# Patient Record
Sex: Female | Born: 1980 | Hispanic: Yes | Smoking: Former smoker
Health system: Southern US, Community
[De-identification: ages and names within clinical notes are randomized; demographics above are authoritative.]

## PROBLEM LIST (undated history)

## (undated) ENCOUNTER — Inpatient Hospital Stay (HOSPITAL_COMMUNITY): Payer: Self-pay

## (undated) DIAGNOSIS — F32A Depression, unspecified: Secondary | ICD-10-CM

## (undated) DIAGNOSIS — T8859XA Other complications of anesthesia, initial encounter: Secondary | ICD-10-CM

## (undated) DIAGNOSIS — D649 Anemia, unspecified: Secondary | ICD-10-CM

## (undated) DIAGNOSIS — I10 Essential (primary) hypertension: Secondary | ICD-10-CM

## (undated) DIAGNOSIS — Z5189 Encounter for other specified aftercare: Secondary | ICD-10-CM

## (undated) DIAGNOSIS — E785 Hyperlipidemia, unspecified: Secondary | ICD-10-CM

## (undated) DIAGNOSIS — R112 Nausea with vomiting, unspecified: Secondary | ICD-10-CM

## (undated) HISTORY — DX: Encounter for other specified aftercare: Z51.89

## (undated) HISTORY — PX: ABDOMINAL HYSTERECTOMY: SHX81

## (undated) HISTORY — DX: Depression, unspecified: F32.A

## (undated) HISTORY — DX: Anemia, unspecified: D64.9

## (undated) HISTORY — PX: OTHER SURGICAL HISTORY: SHX169

---

## 2006-04-09 ENCOUNTER — Emergency Department (HOSPITAL_COMMUNITY): Admission: EM | Admit: 2006-04-09 | Discharge: 2006-04-10 | Payer: Self-pay | Admitting: Emergency Medicine

## 2006-05-04 ENCOUNTER — Inpatient Hospital Stay (HOSPITAL_COMMUNITY): Admission: AD | Admit: 2006-05-04 | Discharge: 2006-05-04 | Payer: Self-pay | Admitting: Obstetrics & Gynecology

## 2006-05-13 ENCOUNTER — Ambulatory Visit: Payer: Self-pay | Admitting: Obstetrics and Gynecology

## 2006-05-13 ENCOUNTER — Ambulatory Visit (HOSPITAL_COMMUNITY): Admission: RE | Admit: 2006-05-13 | Discharge: 2006-05-13 | Payer: Self-pay | Admitting: Family Medicine

## 2006-05-18 ENCOUNTER — Ambulatory Visit (HOSPITAL_COMMUNITY): Admission: RE | Admit: 2006-05-18 | Discharge: 2006-05-18 | Payer: Self-pay | Admitting: Obstetrics and Gynecology

## 2006-05-18 ENCOUNTER — Ambulatory Visit: Payer: Self-pay | Admitting: Obstetrics and Gynecology

## 2006-05-18 ENCOUNTER — Encounter (INDEPENDENT_AMBULATORY_CARE_PROVIDER_SITE_OTHER): Payer: Self-pay | Admitting: *Deleted

## 2007-02-12 ENCOUNTER — Ambulatory Visit (HOSPITAL_COMMUNITY): Admission: RE | Admit: 2007-02-12 | Discharge: 2007-02-12 | Payer: Self-pay | Admitting: Family Medicine

## 2010-08-08 NOTE — Op Note (Signed)
NAME:  Brooke Lam, Brooke Lam             ACCOUNT NO.:  [id number]   MEDICAL RECORD NO.:  [id number]          PATIENT TYPE:  AMB   LOCATION:  SDC                           [facility]ILITY:  [facility]   PHYSICIAN:  Phil D. Okey Dupre, M.D.     DATE OF BIRTH:  [birth year]   DATE OF PROCEDURE:  05/18/2006  DATE OF DISCHARGE:                               OPERATIVE REPORT   PROCEDURE:  Dilatation and evacuation.   PREOPERATIVE DIAGNOSIS:  Incomplete abortion.   POSTOPERATIVE DIAGNOSIS:  Incomplete abortion.   SURGEON:  Javier Glazier. Okey Dupre, M.D.   ANESTHESIA:  MAC plus local.   SPECIMENS TO PATHOLOGY:  Products of conception.   POSTOPERATIVE CONDITION:  Satisfactory.   ESTIMATED BLOOD LOSS:  10 mL   DESCRIPTION OF PROCEDURE:  Under satisfactory MAC sedation with the  patient in dorsal lithotomy position, perineum was prepped and draped in  the usual sterile manner.  Bimanual pelvic examination revealed the  uterus upper limits of normal, freely movable with normal free adnexa.  Weighted speculum was placed in the posterior fourchette of the vagina  through a marital introitus, BUS within normal limits.  Vagina is clean  and well rugated.  The cervix was single was clean and parous and  grasped with a single-tooth tenaculum.  The uterine cavity was sounded  to a depth of 10 cm.  Cervical os easily dilated to a #8 Hagar dilator.  A  #8 suction curette was used to evacuate the uterine contents without  incident.  The tenaculum was removed from the cervix and the speculum  removed from the vagina prior to the suction procedure being carried  out.  We injected 10 mL of 1% Xylocaine into each of the paracervical  areas at 8 and 4 o'clock for further patient comfort.  The patient  tolerated the procedure well and was transferred to the recovery room in  satisfactory condition.           ______________________________  Javier Glazier Okey Dupre, M.D.     PDR/MEDQ  D:  05/18/2006  T:  05/18/2006  Job:  [id number]

## 2010-08-08 NOTE — Group Therapy Note (Signed)
NAME:  Brooke Lam, Brooke Lam NO.:  [id number]   MEDICAL RECORD NO.:  [id number]          PATIENT TYPE:  WOC   LOCATION:  [facility]                   FACILITY:  [facility]   PHYSICIAN:  Argentina Donovan, MD        DATE OF BIRTH:  29-Oct-1980   DATE OF SERVICE:                                  CLINIC NOTE   The patient is a 30 year old English speaking Hispanic female, gravida  4, para 3, 0, 1, 3 with last menstrual period March 11, 2006. She was  seen in the MAU by Dr. Tamela Oddi and is an incomplete missed AB in  need of D&C and will be scheduled ASAP.   HISTORY:  Cesarean section x3. Normal Pap in [place] recently.   PHYSICAL EXAMINATION:  VITAL SIGNS: Temperature 98.9, pulse 77,  respirations 20, blood pressure is 109/65.  GENERAL APPEARANCE: Well-developed, well-nourished Hispanic female in no  acute distress.  HEENT: Within normal limits.  LUNGS: Clear to auscultation and percussion.  CARDIAC:  ABDOMEN: Soft, nontender.  PELVIC EXAM: External genitalia normal. Cervix is closed. Adnexa  nontender. Vagina with small amount of yellow discharge. Uterus about 6  weeks size.   She had a falling beta HCT, the last one done was 902. There was some  debris in the uterus on ultrasound; missed AB or incomplete.           ______________________________  Argentina Donovan, MD     PR/MEDQ  D:  05/13/2006  T:  05/13/2006  Job:  [id number]

## 2011-03-24 HISTORY — PX: BREAST BIOPSY: SHX20

## 2011-06-03 ENCOUNTER — Emergency Department (HOSPITAL_COMMUNITY)
Admission: EM | Admit: 2011-06-03 | Discharge: 2011-06-03 | Disposition: A | Discharge status: Home / Self Care | Payer: Self-pay | Attending: Emergency Medicine | Admitting: Emergency Medicine

## 2011-06-03 ENCOUNTER — Encounter (HOSPITAL_COMMUNITY): Payer: Self-pay | Admitting: Emergency Medicine

## 2011-06-03 ENCOUNTER — Emergency Department (HOSPITAL_COMMUNITY): Payer: Self-pay

## 2011-06-03 DIAGNOSIS — O99891 Other specified diseases and conditions complicating pregnancy: Secondary | ICD-10-CM | POA: Insufficient documentation

## 2011-06-03 DIAGNOSIS — Z3201 Encounter for pregnancy test, result positive: Secondary | ICD-10-CM

## 2011-06-03 DIAGNOSIS — R509 Fever, unspecified: Secondary | ICD-10-CM | POA: Insufficient documentation

## 2011-06-03 DIAGNOSIS — Z8759 Personal history of other complications of pregnancy, childbirth and the puerperium: Secondary | ICD-10-CM

## 2011-06-03 DIAGNOSIS — R1032 Left lower quadrant pain: Secondary | ICD-10-CM | POA: Insufficient documentation

## 2011-06-03 DIAGNOSIS — O210 Mild hyperemesis gravidarum: Secondary | ICD-10-CM | POA: Insufficient documentation

## 2011-06-03 LAB — BASIC METABOLIC PANEL
Calcium: 9.5 mg/dL (ref 8.4–10.5)
Chloride: 101 mEq/L (ref 96–112)
Creatinine, Ser: 0.67 mg/dL (ref 0.50–1.10)
GFR calc Af Amer: >90 mL/min (ref >90)
Potassium: 3.7 mEq/L (ref 3.5–5.1)
Sodium: 134 mEq/L — ABNORMAL LOW (ref 135–145)

## 2011-06-03 LAB — DIFFERENTIAL
Basophils Absolute: 0 10*3/uL (ref 0.0–0.1)
Eosinophils Absolute: 0 10*3/uL (ref 0.0–0.7)
Monocytes Absolute: 0.3 10*3/uL (ref 0.1–1.0)

## 2011-06-03 LAB — URINALYSIS, ROUTINE W REFLEX MICROSCOPIC
Leukocytes, UA: NEGATIVE
Nitrite: NEGATIVE

## 2011-06-03 LAB — WET PREP, GENITAL
Clue Cells Wet Prep HPF POC: NONE SEEN
Trich, Wet Prep: NONE SEEN
WBC, Wet Prep HPF POC: NONE SEEN

## 2011-06-03 LAB — CBC
HCT: 36 % (ref 36.0–46.0)
MCV: 82 fL (ref 78.0–100.0)

## 2011-06-03 LAB — HCG, QUANTITATIVE, PREGNANCY: hCG, Beta Chain, Quant, S: 140 m[IU]/mL — ABNORMAL HIGH (ref <5)

## 2011-06-03 MED ORDER — ONDANSETRON 8 MG PO TBDP: 8.0000 mg | ORAL_TABLET | Freq: Three times a day (TID) | ORAL | Status: AC | PRN

## 2011-06-03 MED ORDER — PRENATAL COMPLETE 14-0.4 MG PO TABS: 1.0000 | ORAL_TABLET | Freq: Every day | ORAL | Status: DC

## 2011-06-03 MED ORDER — ONDANSETRON HCL 4 MG/2ML IJ SOLN: 4.0000 mg | Freq: Once | INTRAMUSCULAR | Status: DC

## 2011-06-03 NOTE — ED Notes (Signed)
States that she took an OTC pregnancy test with positive result. States that she had a fever of 100.4 yesterday accompanied by abd pain and vomiting. States that she has 3 children and this is a different sickness that with previous pregnancies.

## 2011-06-03 NOTE — ED Notes (Signed)
Patient transported to Ultrasound and has now returned. Waiting for results.

## 2011-06-03 NOTE — ED Notes (Signed)
Pt being set up for pelvic exam

## 2011-06-03 NOTE — ED Notes (Signed)
The pt was escorted to the d/c desk. 

## 2011-06-03 NOTE — ED Notes (Signed)
Patient transported to Ultrasound 

## 2011-06-03 NOTE — ED Provider Notes (Signed)
History     CSN: [id number]  Arrival date & time 06/03/11  0841   First MD Initiated Contact with Patient 06/03/11 1036      Chief Complaint  Patient presents with  . Abdominal Pain  . Emesis  . Fever    (Consider location/radiation/quality/duration/timing/severity/associated sxs/prior treatment) HPI Comments: Patient reports that she has had abdominal pain, nausea, vomiting, and diarrhea since yesterday.  She also reports a fever of 100.3 yesterday.  She does not have any known sick contacts.  She describes the abdominal pain as sharp and states that it is intermittent.  Pain is located in the lower abdomen, particularly the LLQ.  She reports that she took an OTC pregnancy test yesterday, which was positive. She has been pregnant 4 other times in the past.  She reports a past history of ectopic pregnancy 4 years ago.  LMP was 05/06/11, which she reports was normal.  No vaginal bleeding, vaginal discharge, or passage of tissue since that time.  She reports that the abdominal pain is worse with certain movements and with walking.    The history is provided by the patient.    History reviewed. No pertinent past medical history.  Past Surgical History  Procedure Date  . Cesarian section     No family history on file.  History  Substance Use Topics  . Smoking status: Never Smoker   . Smokeless tobacco: Not on file  . Alcohol Use: No    OB History    Grav Para Term Preterm Abortions TAB SAB Ect Mult Living   1               Review of Systems  Constitutional: Positive for fever.  Respiratory: Negative for shortness of breath.   Cardiovascular: Negative for chest pain.  Gastrointestinal: Positive for nausea, vomiting, abdominal pain and diarrhea. Negative for constipation and blood in stool.  Genitourinary: Negative for dysuria, urgency, frequency, hematuria, decreased urine volume, vaginal bleeding, vaginal discharge and vaginal pain.  Neurological: Negative for dizziness,  syncope and light-headedness.    Allergies  Review of patient's allergies indicates no known allergies.  Home Medications  No current outpatient prescriptions on file.  BP 114/68  Pulse 86  Temp(Src) 98.6 F (37 C) (Oral)  Resp 19  Wt 174 lb (78.926 kg)  SpO2 100%  LMP 05/06/2011  Physical Exam  Nursing note and vitals reviewed. Constitutional: She is oriented to person, place, and time. She appears well-developed and well-nourished. No distress.  HENT:  Head: Normocephalic and atraumatic.  Neck: Normal range of motion. Neck supple.  Cardiovascular: Normal rate, regular rhythm and normal heart sounds.   Pulmonary/Chest: Effort normal and breath sounds normal.  Abdominal: Soft. Bowel sounds are normal. There is tenderness in the right lower quadrant, suprapubic area and left lower quadrant. There is no rigidity, no rebound, no guarding, no CVA tenderness, no tenderness at McBurney's point and negative Murphy's sign.       Patient with more pain with palpation of the LLQ  Genitourinary: Uterus normal. There is no tenderness or lesion on the right labia. There is no tenderness or lesion on the left labia. Cervix exhibits no motion tenderness, no discharge and no friability. Right adnexum displays no mass, no tenderness and no fullness. Left adnexum displays tenderness. Left adnexum displays no mass and no fullness. No bleeding around the vagina.       Cervical os closed  Neurological: She is alert and oriented to person, place, and time.  Skin: Skin is warm and dry. No rash noted. She is not diaphoretic.  Psychiatric: She has a normal mood and affect.    ED Course  Procedures (including critical care time)   Labs Reviewed  URINALYSIS, ROUTINE W REFLEX MICROSCOPIC  PREGNANCY, URINE  HCG, QUANTITATIVE, PREGNANCY  CBC  DIFFERENTIAL  BASIC METABOLIC PANEL  GC/CHLAMYDIA PROBE AMP, GENITAL  WET PREP, GENITAL   US Ob Comp Less 14 Wks  06/03/2011  *RADIOLOGY REPORT*  Clinical  Data: Early pregnancy.  Estimated gestational age by LMP is 4 weeks 0 days.  History of ectopic pregnancy in the past.  The patient is unsure if the left ovary was surgically removed. Quantitative beta HCG level is reportedly 140.  OBSTETRIC <14 WK [place] AND TRANSVAGINAL OB US  Technique:  Both transabdominal and transvaginal ultrasound examinations were performed for complete evaluation of the gestation as well as the maternal uterus, adnexal regions, and pelvic cul-de-sac.  Transvaginal technique was performed to assess early pregnancy.  Comparison:  None.  Intrauterine gestational sac:  None Yolk sac: None Embryo: None  Maternal uterus/adnexae: The right ovary measures 3.3 x 2.8 x 2.3 cm and contains a 1.9 x 1.3 x 1.4 cm corpus luteum cyst versus dominant follicle.  No right ovarian or right adnexal mass is seen. The left ovary is not visualized.  No left adnexal mass is seen.  Uterus measures 8.3 x 4.7 x 6.1 cm and has a normal appearance. The endometrium measures 9 mm maximal thickness. There is no free intraperitoneal fluid.  IMPRESSION: No intrauterine pregnancy or adnexal mass is seen.  The differential diagnosis includes intrauterine pregnancy too early to visualize, occult ectopic pregnancy, or missed spontaneous abortion.  Correlation with serial quantitative beta HCG levels, and follow-up ultrasound in 10-14 days should be considered.  Normal appearance of the uterus and right ovary.  The left ovary is not visualized.  Original Report Authenticated By: Britta Mccreedy, M.D.   US Ob Transvaginal  06/03/2011  *RADIOLOGY REPORT*  Clinical Data: Early pregnancy.  Estimated gestational age by LMP is 4 weeks 0 days.  History of ectopic pregnancy in the past.  The patient is unsure if the left ovary was surgically removed. Quantitative beta HCG level is reportedly 140.  OBSTETRIC <14 WK [place] AND TRANSVAGINAL OB US  Technique:  Both transabdominal and transvaginal ultrasound examinations were performed for complete  evaluation of the gestation as well as the maternal uterus, adnexal regions, and pelvic cul-de-sac.  Transvaginal technique was performed to assess early pregnancy.  Comparison:  None.  Intrauterine gestational sac:  None Yolk sac: None Embryo: None  Maternal uterus/adnexae: The right ovary measures 3.3 x 2.8 x 2.3 cm and contains a 1.9 x 1.3 x 1.4 cm corpus luteum cyst versus dominant follicle.  No right ovarian or right adnexal mass is seen. The left ovary is not visualized.  No left adnexal mass is seen.  Uterus measures 8.3 x 4.7 x 6.1 cm and has a normal appearance. The endometrium measures 9 mm maximal thickness. There is no free intraperitoneal fluid.  IMPRESSION: No intrauterine pregnancy or adnexal mass is seen.  The differential diagnosis includes intrauterine pregnancy too early to visualize, occult ectopic pregnancy, or missed spontaneous abortion.  Correlation with serial quantitative beta HCG levels, and follow-up ultrasound in 10-14 days should be considered.  Normal appearance of the uterus and right ovary.  The left ovary is not visualized.  Original Report Authenticated By: Britta Mccreedy, M.D.     No diagnosis  found.  Discussed patient with Dr. Tamela Oddi with OB/GYN.  She reports that the patient can follow up with her on Friday morning.  Scheduled patient an appointment for Friday morning.  MDM  Patient with history of ectopic pregnancies comes in today with lower abdominal pain, worse in the LLQ.  Pregnancy test done in the ED was positive.  According to patient LMP was 05/06/11 and was normal.  Pelvic exam revealed closed cervical os.  No bleeding.  Ultrasound was performed to rule out ectopic pregnancy.  Ultrasound did not yet show a gestational sac.  Beta quantitative was 140.  Patient discussed with Dr. Tamela Oddi and has follow up appointment scheduled for 2 days to have repeat beta quantitative performed.  Follow up instructions discussed with patient using Spanish phone  interpretor.  Patient verbalizes understanding.        Pascal Lux [place], PA-C 06/04/11 1229

## 2011-06-03 NOTE — Discharge Instructions (Signed)
Follow up with Dr. Tamela Oddi on Friday.  You will need to be to the office at 8:45 AM.   Take zofran as needed for nausea Take prenatal vitamins daily. Return to [facility] hospital if you have vaginal bleeding, vaginal discharge, passage of vaginal tissue, or if abdominal pain becomes more severe.

## 2011-06-03 NOTE — ED Notes (Signed)
Pelvic cart is set up 

## 2011-06-04 LAB — GC/CHLAMYDIA PROBE AMP, GENITAL: Chlamydia, DNA Probe: NEGATIVE

## 2011-06-06 NOTE — ED Provider Notes (Signed)
Medical screening examination/treatment/procedure(s) were conducted as a shared visit with non-physician practitioner(s) and myself.  I personally evaluated the patient during the encounter  Pregnant with LLQ abdominal pain. NAD. Beta quant below threshold for visualization of IUP on U/S. F/U with OB arranged by PA.  Forbes Cellar, MD 06/06/11 1025

## 2011-06-08 ENCOUNTER — Emergency Department (HOSPITAL_COMMUNITY)
Admission: EM | Admit: 2011-06-08 | Discharge: 2011-06-08 | Discharge status: Against Medical Advice | Payer: Self-pay | Attending: Emergency Medicine | Admitting: Emergency Medicine

## 2011-06-08 ENCOUNTER — Other Ambulatory Visit: Payer: Self-pay | Admitting: Obstetrics

## 2011-06-08 DIAGNOSIS — Z049 Encounter for examination and observation for unspecified reason: Secondary | ICD-10-CM | POA: Insufficient documentation

## 2011-06-08 DIAGNOSIS — O3680X0 Pregnancy with inconclusive fetal viability, not applicable or unspecified: Secondary | ICD-10-CM

## 2011-06-10 ENCOUNTER — Ambulatory Visit (HOSPITAL_COMMUNITY)
Admission: RE | Admit: 2011-06-10 | Discharge: 2011-06-10 | Disposition: A | Discharge status: Home / Self Care | Payer: Self-pay | Source: Ambulatory Visit | Attending: Obstetrics | Admitting: Obstetrics

## 2011-06-10 DIAGNOSIS — O3680X0 Pregnancy with inconclusive fetal viability, not applicable or unspecified: Secondary | ICD-10-CM | POA: Insufficient documentation

## 2011-06-10 DIAGNOSIS — Z3689 Encounter for other specified antenatal screening: Secondary | ICD-10-CM | POA: Insufficient documentation

## 2011-07-14 ENCOUNTER — Emergency Department (HOSPITAL_COMMUNITY): Payer: Self-pay

## 2011-07-14 ENCOUNTER — Encounter (HOSPITAL_COMMUNITY): Payer: Self-pay | Admitting: Emergency Medicine

## 2011-07-14 ENCOUNTER — Emergency Department (HOSPITAL_COMMUNITY)
Admission: EM | Admit: 2011-07-14 | Discharge: 2011-07-15 | Disposition: A | Discharge status: Home / Self Care | Payer: Self-pay | Attending: Emergency Medicine | Admitting: Emergency Medicine

## 2011-07-14 DIAGNOSIS — N898 Other specified noninflammatory disorders of vagina: Secondary | ICD-10-CM | POA: Insufficient documentation

## 2011-07-14 DIAGNOSIS — N852 Hypertrophy of uterus: Secondary | ICD-10-CM | POA: Insufficient documentation

## 2011-07-14 DIAGNOSIS — R109 Unspecified abdominal pain: Secondary | ICD-10-CM | POA: Insufficient documentation

## 2011-07-14 DIAGNOSIS — O3680X0 Pregnancy with inconclusive fetal viability, not applicable or unspecified: Secondary | ICD-10-CM | POA: Insufficient documentation

## 2011-07-14 MED ORDER — SODIUM CHLORIDE 0.9 % IV BOLUS (SEPSIS): 1000.0000 mL | Freq: Once | INTRAVENOUS | Status: DC

## 2011-07-14 MED ORDER — SODIUM CHLORIDE 0.9 % IV BOLUS (SEPSIS)
1000.0000 mL | Freq: Once | INTRAVENOUS | Status: AC
  Administered 2011-07-14: 1000 mL via INTRAVENOUS

## 2011-07-14 NOTE — ED Notes (Signed)
Pt states she is 2.5 months pregnant and is having vaginal bleeding with lower abd pain that started about 2 hrs ago  Pt states the drainage is the color of coffee and is like water  Pt has nausea   Pt states the pain radiates into her back  Pt states her last normal period was Feb. 13th  Pt states she has been feeling light headed too

## 2011-07-14 NOTE — ED Notes (Signed)
Previous Hx of miscarriage and ectopic pregnancy per records. C/o lower abd, suprapubic pain that radiates to lower back. Describes pain as "pinching." Pt in NAD, lying in bed, talking to friend.

## 2011-07-14 NOTE — ED Provider Notes (Signed)
History     CSN: [id number]  Arrival date & time 07/14/11  2041   First MD Initiated Contact with Patient 07/14/11 2130      Chief Complaint  Patient presents with  . Vaginal Bleeding    (Consider location/radiation/quality/duration/timing/severity/associated sxs/prior treatment) HPI Comments: Patient approximately [redacted] weeks pregnant today she noticed a sudden gush of "brownish fluid" and some abdominal cramping.  She has been seen several times by her OB/GYN for this pregnancy on March 13.  A quantitative beta of 143 with an ultrasound that showed the sac.  She had a repeat ultrasound on March 23, which showed she had a viable intrauterine pregnancy.  A repeat quantitative beta was not done at that time by her OB/GYN.  She did call her OB today and was told to come to the emergency department.  This patient does have a history of live births.  Her youngest child is 6, 3 years ago she had an ectopic pregnancy.  She was placed on bed rest  by her OB/GYN and told if any time she had discharge or pain to come to the emergency room.  Patient is a 31 y.o. female presenting with vaginal bleeding. The history is provided by the patient.  Vaginal Bleeding This is a new problem. The current episode started today. The problem occurs constantly. The problem has been unchanged. Associated symptoms include abdominal pain. Pertinent negatives include no chills or fever.    History reviewed. No pertinent past medical history.  Past Surgical History  Procedure Date  . Cesarian section   . Cesarean section     History reviewed. No pertinent family history.  History  Substance Use Topics  . Smoking status: Never Smoker   . Smokeless tobacco: Not on file  . Alcohol Use: No    OB History    Grav Para Term Preterm Abortions TAB SAB Ect Mult Living   1               Review of Systems  Constitutional: Negative for fever and chills.  Gastrointestinal: Positive for abdominal pain.    Genitourinary: Positive for vaginal bleeding. Negative for vaginal discharge and vaginal pain.    Allergies  Review of patient's allergies indicates no known allergies.  Home Medications   Current Outpatient Rx  Name Route Sig Dispense Refill  . PRENATAL COMPLETE 14-0.4 MG PO TABS Oral Take 1 tablet by mouth daily. 60 each 0    BP 115/59  Pulse 76  Temp(Src) 99.4 F (37.4 C) (Oral)  Resp 16  SpO2 99%  LMP 05/06/2011  Physical Exam  Constitutional: She appears well-developed.  HENT:  Head: Normocephalic.  Eyes: Pupils are equal, round, and reactive to light.  Neck: Normal range of motion.  Cardiovascular: Normal rate.   Pulmonary/Chest: Effort normal.  Abdominal: Soft.  Genitourinary: Vagina normal. Guaiac negative stool. Uterus is enlarged. Uterus is not tender. Cervix exhibits no discharge. No vaginal discharge found.       Cervical os is closed small amount of brown tinged mucus within the vaginal vault    ED Course  Procedures (including critical care time)  Labs Reviewed  HCG, QUANTITATIVE, PREGNANCY - Abnormal; Notable for the following:    hCG, Beta Chain, Quant, S 1728 (*)    All other components within normal limits   US Ob Comp Less 14 Wks  07/15/2011  *RADIOLOGY REPORT*  Clinical Data: Vaginal bleeding, positive pregnancy test.  Medical treatment for ectopic pregnancy 3 years ago  OBSTETRIC <  14 WK [place] AND TRANSVAGINAL OB US  Technique:  Both transabdominal and transvaginal ultrasound examinations were performed for complete evaluation of the gestation as well as the maternal uterus, adnexal regions, and pelvic cul-de-sac.  Transvaginal technique was performed to assess early pregnancy.  Comparison:  06/10/2011  Intrauterine gestational sac:  Visualized/normal in shape. Yolk sac: Not visualized Embryo: Not visualized Cardiac Activity: Not visualized  MSD: 20  mm  6    w 6    d    [place] EDC: 03/02/12  Maternal uterus/adnexae: Right ovary is normal.  Left ovary not  visualized.  No left adnexal mass.  IMPRESSION: Intrauterine gestational sac but no yolk sac, fetal pole, or cardiac activity identified.  If the patient has had no bleeding or other sign of miscarriage since the prior study of 06/10/2011, this could represent failed pregnancy given lack of normal progression of first trimester findings, if indeed a gestational sac was previously visible.  However, given that more than 1 month has elapsed since the previous exam, and findings previously were not definitive for an intrauterine gestational sac, it is possible this represents a new gestational sac not previously visualized, in which case follow-up may be helpful to differentiate between these two scenarios.  If in 10 days there is no development of a yolk sac or fetal pole, then findings would be definitive for failed intrauterine pregnancy.  Original Report Authenticated By: Harrel Lemon, M.D.   US Ob Transvaginal  07/15/2011  *RADIOLOGY REPORT*  Clinical Data: Vaginal bleeding, positive pregnancy test.  Medical treatment for ectopic pregnancy 3 years ago  OBSTETRIC <14 WK [place] AND TRANSVAGINAL OB US  Technique:  Both transabdominal and transvaginal ultrasound examinations were performed for complete evaluation of the gestation as well as the maternal uterus, adnexal regions, and pelvic cul-de-sac.  Transvaginal technique was performed to assess early pregnancy.  Comparison:  06/10/2011  Intrauterine gestational sac:  Visualized/normal in shape. Yolk sac: Not visualized Embryo: Not visualized Cardiac Activity: Not visualized  MSD: 20  mm  6    w 6    d    [place] EDC: 03/02/12  Maternal uterus/adnexae: Right ovary is normal.  Left ovary not visualized.  No left adnexal mass.  IMPRESSION: Intrauterine gestational sac but no yolk sac, fetal pole, or cardiac activity identified.  If the patient has had no bleeding or other sign of miscarriage since the prior study of 06/10/2011, this could represent failed pregnancy  given lack of normal progression of first trimester findings, if indeed a gestational sac was previously visible.  However, given that more than 1 month has elapsed since the previous exam, and findings previously were not definitive for an intrauterine gestational sac, it is possible this represents a new gestational sac not previously visualized, in which case follow-up may be helpful to differentiate between these two scenarios.  If in 10 days there is no development of a yolk sac or fetal pole, then findings would be definitive for failed intrauterine pregnancy.  Original Report Authenticated By: Harrel Lemon, M.D.     1. Pregnancy with inconclusive fetal viability       MDM  We'll repeat quantitative beta give patient IV bolus of fluids and obtain repeat ultrasound for fetal viability Discussed at length.  Results of the ultrasound and HCG level answered all questions She understands that she needs to make an appointment with her obstetrician to be reevaluated in 10 days.  The ultrasound, as well as having a repeat HCG  level.  She also understands that as she develops any abdominal cramping, vaginal discharge, vaginal bleeding.  She is to go to [facility] for further evaluation      Arman Filter, NP 07/14/11 2227  Arman Filter, NP 07/15/11 [phone]

## 2011-07-15 ENCOUNTER — Other Ambulatory Visit (HOSPITAL_COMMUNITY): Payer: Self-pay | Admitting: Emergency Medicine

## 2011-07-15 DIAGNOSIS — O3680X0 Pregnancy with inconclusive fetal viability, not applicable or unspecified: Secondary | ICD-10-CM

## 2011-07-15 NOTE — ED Notes (Signed)
Patient transported to Ultrasound.  Pt not in room during hourly rounding.

## 2011-07-15 NOTE — Discharge Instructions (Signed)
Today your HCG is 1732, which is increased from the previous reading of 142, but the ultrasound is only reading out a 6 week pregnancy.  There is no visible fetal pole within the gestational sac.  There is question whether this is a new pregnancy or if there is failure to develop in the previously noted pregnancy, which should be at the 10 week point.  As discussed please call your obstetrician in the morning and set an appointment to have a repeat ultrasound in 10 days, as well as a repeat HCG level.  If you develop cramping, vaginal bleeding.  Please go immediately to [facility] for further evaluation Please continue bedrest and pelvic rest for your obstetrician's instruction

## 2011-07-15 NOTE — ED Provider Notes (Signed)
Medical screening examination/treatment/procedure(s) were performed by non-physician practitioner and as supervising physician I was immediately available for consultation/collaboration.  Gerhard Munch, MD 07/15/11 2229

## 2011-07-15 NOTE — ED Notes (Signed)
Patient transported to Ultrasound 

## 2011-07-16 ENCOUNTER — Encounter (HOSPITAL_COMMUNITY): Payer: Self-pay | Admitting: *Deleted

## 2011-07-16 ENCOUNTER — Inpatient Hospital Stay (HOSPITAL_COMMUNITY)
Admission: AD | Admit: 2011-07-16 | Discharge: 2011-07-16 | Disposition: A | Discharge status: Home / Self Care | Payer: Self-pay | Source: Ambulatory Visit | Attending: Obstetrics & Gynecology | Admitting: Obstetrics & Gynecology

## 2011-07-16 DIAGNOSIS — O209 Hemorrhage in early pregnancy, unspecified: Secondary | ICD-10-CM | POA: Insufficient documentation

## 2011-07-16 DIAGNOSIS — O00109 Unspecified tubal pregnancy without intrauterine pregnancy: Secondary | ICD-10-CM | POA: Insufficient documentation

## 2011-07-16 DIAGNOSIS — R109 Unspecified abdominal pain: Secondary | ICD-10-CM | POA: Insufficient documentation

## 2011-07-16 LAB — CBC
Hemoglobin: 11.7 g/dL — ABNORMAL LOW (ref 12.0–15.0)
MCH: 27.7 pg (ref 26.0–34.0)
MCV: 83.4 fL (ref 78.0–100.0)
RBC: 4.22 MIL/uL (ref 3.87–5.11)

## 2011-07-16 MED ORDER — IBUPROFEN 600 MG PO TABS
600.0000 mg | ORAL_TABLET | Freq: Once | ORAL | Status: AC
  Administered 2011-07-16: 600 mg via ORAL
  Filled 2011-07-16: qty 1

## 2011-07-16 NOTE — Discharge Instructions (Signed)
Return in 2 days to repeat the pregnancy hormone level. Return sooner for any problems.  Hemorragia vaginal durante el embarazo, primer trimestre (Vaginal Bleeding During Pregnancy, First Trimester) Durante el embarazo es relativamente frecuente que se presente una pequea hemorragia (manchas). Esta situacin generalmente mejora por s misma. Existen muchas causas para la hemorragia o prdidas durante el principio del Psychiatrist. Algunas hemorragias pueden estar relacionadas al [employer] y otras no. Si aparecen retortijones con la hemorragia es ms serio y preocupante. Informe al mdico si tiene cualquier tipo de hemorragia vaginal.  CAUSAS  En la mayora de los [place] es normal.   El embarazo finaliza (aborto espontneo).   El Psychiatrist podra terminar (amenaza de aborto espontneo).   Infeccin o inflamacin del tero.   Tumores (plipos) en el tero.   El embarazo sucede por fuera del tero y en las trompas de falopio (embarazo ectpico).   Muchos quistes pequeos en el tero en lugar del tejido del embarazo (embarazo molar).  SNTOMAS Hemorragia o goteo vaginal con o sin retortijones. DIAGNSTICO Para evaluar el embarazo, el mdico podr:  Education officer, environmental un examen plvico.   Tomar anlisis de [place].   Realizar una prueba de [place].  Es importante seguir las indicaciones del profesional que la asiste.  TRATAMIENTO  Evaluacin del embarazo con anlisis de [place].   Reposo en cama (slo levantarse para ir al bao).   Inmunizacin con Rho-gam si la madre es Rh negativo y el padre es Rh positivo.  INSTRUCCIONES PARA EL CUIDADO DOMICILIARIO  Si el mdico le indica reposo en cama, usted necesitar hacer algunos arreglos para que otra persona se ocupe del cuidado de los nios y de otras responsabilidades adicionales. Sin embargo, podr autorizarla a Building surveyor.   Lleve un registro de la cantidad y la saturacin de las toallas higinicas que [place]. Escriba esta informacin:   No use tampones. No utilice duchas vaginales.   No tenga relaciones sexuales u orgasmos hasta que el mdico la autorice.   Guarde una muestra de los tejidos para que el profesional lo inspeccione.   Tome medicamentos para el dolor slo con el permiso del mdico.   No tome aspirina, ya que puede causar hemorragias.  SOLICITE ATENCIN MDICA INMEDIATAMENTE SI:  Siente calambres intensos en el estmago, en la espalda o en el vientre (abdomen).   Usted tiene una temperatura oral de ms de 102 F (38.9 C) y no puede controlarla con medicamentos.   Elimina cogulos o tejidos grandes.   La hemorragia aumenta o se siente mareada dbil o tiene episodios de desmayos.   Comienza a sentir escalofros.   Tiene una prdida de lquido por la vagina.   No puede mover el intestino. Podra tratarse de un embarazo ectpico.  Document Released: 12/17/2004 Document Revised: 02/26/2011 [facility] Patient Information 2012 [place], [place].

## 2011-07-16 NOTE — MAU Provider Note (Signed)
Attestation of Attending Supervision of Advanced Practitioner: Evaluation and management procedures were performed by the OB Fellow/PA/CNM/NP under my supervision and collaboration. Chart reviewed, and agree with management and plan.  Slade Pierpoint, M.D. 07/16/2011 11:03 AM   

## 2011-07-16 NOTE — MAU Note (Signed)
Pain started in abd last night at 2100, cramping like a period. Bleeding started at 0330, runing out like a period, passing clots and feels more like labor now.  Radiates into back and upper legs.

## 2011-07-16 NOTE — MAU Provider Note (Signed)
History     CSN: [id number]  Arrival date & time 07/16/11  0806   None     Chief Complaint  Patient presents with  . Abdominal Pain  . Back Pain  . Vaginal Bleeding    HPI Brooke Lam is a 31 y.o. female who presents to MAU for vaginal bleeding in early pregnancy. The history was provided by the patient using the hospital translator Estate manager/land agent) and review of her previous visits to [facility] ED. The patient's initial visit to the ED was 06/03/11. At that visit she had a positive pregnancy test and Bhcg was 140. She complained of low abdominal pain and history of ectopic pregnancy 4 years ago. Ultrasound was done and showed  No IUP or adnexal mass, right CLC. The patient returned on 8/18 for follow up ultrasound that showed a probable early IUGS but no YS or FP. The patient returned to the ED yesterday and a Bhcg was repeated and was 1728.  The ultrasound showed an early IUGS, no YS or FP. Today she reports cramping and bleeding. She reported low grad fever yesterday but none today. Today Bhcg is 1222. Past Medical History  Diagnosis Date  . Ectopic pregnancy     Past Surgical History  Procedure Date  . Cesarian section   . Cesarean section     Family History  Problem Relation Age of Onset  . Anesthesia problems Neg Hx     History  Substance Use Topics  . Smoking status: Former Smoker    Types: Cigarettes  . Smokeless tobacco: Never Used   Comment: quit 2005  . Alcohol Use: No    OB History    Grav Para Term Preterm Abortions TAB SAB Ect Mult Living   5 3 3  0 1 0 0 1 0 3      Review of Systems  Constitutional: Positive for chills. Negative for fever.  HENT: Negative.   Eyes: Negative.   Respiratory: Negative.   Cardiovascular: Negative.   Gastrointestinal: Positive for nausea and abdominal pain. Negative for diarrhea and constipation.  Genitourinary: Positive for frequency, vaginal bleeding and pelvic pain. Negative for dysuria, urgency and vaginal discharge.    Musculoskeletal: Positive for back pain.  Neurological: Negative for dizziness, light-headedness and headaches.  Psychiatric/Behavioral: Negative for behavioral problems. The patient is not nervous/anxious.     Allergies  Review of patient's allergies indicates no known allergies.  Home Medications  No current outpatient prescriptions on file.  BP 119/76  Pulse 66  Temp(Src) 97.6 F (36.4 C) (Axillary)  Resp 20  Ht 5' 2.5" (1.588 m)  Wt 174 lb (78.926 kg)  BMI 31.32 kg/m2  LMP 05/06/2011  Physical Exam  Nursing note and vitals reviewed. Constitutional: She is oriented to person, place, and time. She appears well-developed and well-nourished.  HENT:  Head: Normocephalic.  Eyes: EOM are normal.  Neck: Neck supple.  Cardiovascular: Normal rate.   Pulmonary/Chest: Effort normal.  Abdominal: Soft. There is tenderness.       Mildly tender lower abdomen without rebound or guarding.  Genitourinary:       External genitalia without lesions. Small blood vaginal vault. Cervix closed, long, no CMT, no adnexal tenderness. Uterus without palpable enlargement.  Musculoskeletal: Normal range of motion.  Neurological: She is alert and oriented to person, place, and time. No cranial nerve deficit.  Skin: Skin is warm and dry.  Psychiatric: She has a normal mood and affect. Her behavior is normal. Judgment and thought content normal.  ED Course  Procedures  Labs Reviewed  HCG, QUANTITATIVE, PREGNANCY - Abnormal; Notable for the following:    hCG, Beta Chain, Quant, S 1222 (*)    All other components within normal limits  CBC - Abnormal; Notable for the following:    Hemoglobin 11.7 (*)    HCT 35.2 (*)    All other components within normal limits  ABO/RH   US Ob Comp Less 14 Wks  07/15/2011  *RADIOLOGY REPORT*  Clinical Data: Vaginal bleeding, positive pregnancy test.  Medical treatment for ectopic pregnancy 3 years ago  OBSTETRIC <14 WK [place] AND TRANSVAGINAL OB US  Technique:   Both transabdominal and transvaginal ultrasound examinations were performed for complete evaluation of the gestation as well as the maternal uterus, adnexal regions, and pelvic cul-de-sac.  Transvaginal technique was performed to assess early pregnancy.  Comparison:  06/10/2011  Intrauterine gestational sac:  Visualized/normal in shape. Yolk sac: Not visualized Embryo: Not visualized Cardiac Activity: Not visualized  MSD: 20  mm  6    w 6    d    [place] EDC: 03/02/12  Maternal uterus/adnexae: Right ovary is normal.  Left ovary not visualized.  No left adnexal mass.  IMPRESSION: Intrauterine gestational sac but no yolk sac, fetal pole, or cardiac activity identified.  If the patient has had no bleeding or other sign of miscarriage since the prior study of 06/10/2011, this could represent failed pregnancy given lack of normal progression of first trimester findings, if indeed a gestational sac was previously visible.  However, given that more than 1 month has elapsed since the previous exam, and findings previously were not definitive for an intrauterine gestational sac, it is possible this represents a new gestational sac not previously visualized, in which case follow-up may be helpful to differentiate between these two scenarios.  If in 10 days there is no development of a yolk sac or fetal pole, then findings would be definitive for failed intrauterine pregnancy.  Original Report Authenticated By: Harrel Lemon, M.D.   US Ob Transvaginal  07/15/2011  *RADIOLOGY REPORT*  Clinical Data: Vaginal bleeding, positive pregnancy test.  Medical treatment for ectopic pregnancy 3 years ago  OBSTETRIC <14 WK [place] AND TRANSVAGINAL OB US  Technique:  Both transabdominal and transvaginal ultrasound examinations were performed for complete evaluation of the gestation as well as the maternal uterus, adnexal regions, and pelvic cul-de-sac.  Transvaginal technique was performed to assess early pregnancy.  Comparison:  06/10/2011   Intrauterine gestational sac:  Visualized/normal in shape. Yolk sac: Not visualized Embryo: Not visualized Cardiac Activity: Not visualized  MSD: 20  mm  6    w 6    d    [place] EDC: 03/02/12  Maternal uterus/adnexae: Right ovary is normal.  Left ovary not visualized.  No left adnexal mass.  IMPRESSION: Intrauterine gestational sac but no yolk sac, fetal pole, or cardiac activity identified.  If the patient has had no bleeding or other sign of miscarriage since the prior study of 06/10/2011, this could represent failed pregnancy given lack of normal progression of first trimester findings, if indeed a gestational sac was previously visible.  However, given that more than 1 month has elapsed since the previous exam, and findings previously were not definitive for an intrauterine gestational sac, it is possible this represents a new gestational sac not previously visualized, in which case follow-up may be helpful to differentiate between these two scenarios.  If in 10 days there is no development of a yolk  sac or fetal pole, then findings would be definitive for failed intrauterine pregnancy.  Original Report Authenticated By: Harrel Lemon, M.D.    Assessment: Bleeding in early pregnancy  Diff. Dx Failed pregnancy   Occult ectopic pregnancy   Early IUP although given the drop in Bhcg this is unlikely  Plan:  Return in 48 hours to repeat the Bhcg   Return immediately for severe pain, heavy bleeding or other problems.    I have reviewed this patient's vital signs, nurses notes, appropriate labs and imaging. I have reviewed the past visits to the   ED. I have discussed this patient with Dr. Macon Large and she agrees with plan of care.    MDM

## 2011-07-16 NOTE — Progress Notes (Signed)
[facility] Department of Clinical Social Work Documentation of Interpretation   I assisted ___Jolynn RN________________ with interpretation of __questions____________________ for this patient. 

## 2011-07-18 ENCOUNTER — Inpatient Hospital Stay (HOSPITAL_COMMUNITY)
Admission: AD | Admit: 2011-07-18 | Discharge: 2011-07-18 | Disposition: A | Discharge status: Home / Self Care | Payer: Self-pay | Source: Ambulatory Visit | Attending: Obstetrics & Gynecology | Admitting: Obstetrics & Gynecology

## 2011-07-18 ENCOUNTER — Encounter (HOSPITAL_COMMUNITY): Payer: Self-pay | Admitting: *Deleted

## 2011-07-18 DIAGNOSIS — O034 Incomplete spontaneous abortion without complication: Secondary | ICD-10-CM | POA: Insufficient documentation

## 2011-07-18 DIAGNOSIS — R109 Unspecified abdominal pain: Secondary | ICD-10-CM | POA: Insufficient documentation

## 2011-07-18 MED ORDER — IBUPROFEN 600 MG PO TABS: 600.0000 mg | ORAL_TABLET | Freq: Four times a day (QID) | ORAL | Status: DC | PRN

## 2011-07-18 MED ORDER — OXYCODONE-ACETAMINOPHEN 5-325 MG PO TABS
2.0000 | ORAL_TABLET | ORAL | Status: AC
  Administered 2011-07-18: 2 via ORAL
  Filled 2011-07-18: qty 2

## 2011-07-18 MED ORDER — OXYCODONE-ACETAMINOPHEN 5-325 MG PO TABS: 1.0000 | ORAL_TABLET | ORAL | Status: DC | PRN

## 2011-07-18 NOTE — MAU Note (Signed)
I was here 2 days ago. I was to come back today for repeat lab work. After I left i started having worse cramps in my stomach. I continue to have bad cramping that comes and goes

## 2011-07-18 NOTE — Discharge Instructions (Signed)
Aborto incompleto (Incomplete Miscarriage) Los abortos son algo frecuente en los [place]. Le han diagnosticado que ha sufrido un aborto incompleto. Se trata de un embarazo que ha finalizado antes de la vigsima semana y [place] partes del feto o de la placenta han quedado en el tero. En algunos casos se requiere Pharmacist, community. La razn ms frecuente para Education officer, environmental un tratamiento es el sangrado (hemorragia) continuo que generalmente ocurre. Tambin puede [employer] tejidos que no se han expulsado se infecten. El tratamiento generalmente se realiza con un curetaje. El curetaje se lleva a cabo en el aborto incompleto como procedimiento para extraer los tejidos del embarazo que no se han expulsado. Se realizar por medio de un sencillo procedimiento de succin (curetaje por succin) o por medio de un raspado simple del interior del tero. Podr llevarse a cabo en el hospital o en el consultorio del profesional. Slo se efecta cuando el profesional tiene la certeza de que el embarazo se ha interrumpido. La certeza se determina a travs del examen fsico, una prueba de Psychiatrist negativa y por medio de una ecografa que revele la existencia de un feto muero o partes del embarazo que hubieran quedado en el tero. En ocasiones, cuando el cuello del tero queda dilatado y an se eliminan cogulos y tejidos, el profesional podr esperar para ver si elimina todos los tejidos sin necesidad de Education officer, environmental un procedimiento. Si la hemorragia persiste, podr proceder inmediatamente con el curetaje. POR QU ME SIENTO DE ESTE MODO Un aborto suele ser un perodo muy emotivo para las futuras mams; sin embargo, Engineer, maintenance (IT) consuelo teniendo en cuenta que no es su culpa ni la de su pareja. No ocurre por conductas inapropiadas por parte suya o de su compaero. Casi todos los abortos se producen porque el embarazo ha comenzado de un modo incorrecto. Al menos en la mitad de estos embarazos, cuando se los estudia, existe una  anormalidad cromosmica (casi siempre no hereditaria). Otros podran presentar problemas en el desarrollo del feto o de la placenta, los que no necesariamente se observan en los productos del aborto cuando se estudian al microscopio. El aborto casi nunca se produce por su culpa. Casi siempre puede tratar de embarazarse nuevamente tan pronto como el profesional la autorice. INSTRUCCIONES PARA EL CUIDADO DOMICILIARIO  El profesional puede indicarle reposo en cama (slo podr levantarse para ir al cuarto de bao), o podr permitirle que contine realizando tareas livianas. Si el curetaje no se realiza en [employer], podr Financial risk analyst sus cosas con respecto al cuidado de los nios y a otras responsabilidades en caso de un tratamiento ms intensivo o de hospitalizacin.   Lleve un registro de la cantidad y la saturacin de las toallas higinicas (cunto absorben) que Landscape architect. [place] esta informacin.   No usetampones. No se haga duchas vaginales ni tenga relaciones sexuales hasta que el mdico la autorice.   Es muy importante que cumpla con todas las citas de seguimiento para una nueva evaluacin y para Administrator, Civil Service.   Las mujeres con tipo sanguneo Rh negativo (A, B, AB o 0 negativo deben recibir una droga denominada inmuno globulina Rh (D) (RhoGam ) Este medicamento ayuda a proteger al feto de futuros problemas que puedan ocurrir si una madre Rh negativo da a luz a un beb Rh positivo.  CONCIERTE UNA CITA CON EL MDICO, O CON EL PROFESIONAL AL QUE LA HAN DERIVADO O VAYA A LA SALA DE EMERGENCIAS INMEDIATAMENTE SI:  Siente clicos intensos en el estmago,  en la espalda o en el abdomen.   Presenta una temperatura elevada inexplicable (antela).   Elimina cogulos grandes o tejidos (conserve una parte de los tejidos para [employer] observe).   La hemorragia aumenta o se siente mareada, dbil o tiene episodios de desmayos.  EST SEGURO QUE:   Comprende las  instrucciones para el alta mdica.   Controlar su enfermedad.   Solicitar atencin mdica de inmediato segn las indicaciones.  Document Released: 03/09/2005 Document Revised: 02/26/2011 [facility] Patient Information 2012 [place], [place].

## 2011-07-18 NOTE — Progress Notes (Signed)
V. Smith CNM notified of pt's admission and status. Will see pt 

## 2011-07-18 NOTE — MAU Provider Note (Signed)
History     CSN: [id number]  Arrival date and time: 07/18/11 9562   First Provider Initiated Contact with Patient 07/18/11 0815     30 [id number] @[redacted]w[redacted]d  by LMP Chief Complaint  Patient presents with  . Abdominal Cramping   HPI Pt presents to MAU for follow up quantitative HCG.  She had labs drawn and U/S on 07/16/11 in MAU.  Today she is having uncomfortable abdominal cramping not relieved by Ibuprofen but denies vaginal bleeding, dizziness, h/a, n/v, or fever/chills. Engineer, structural used for all communication.  OB History    Grav Para Term Preterm Abortions TAB SAB Ect Mult Living   5 3 3  0 1 0 0 1 0 3      Past Medical History  Diagnosis Date  . Ectopic pregnancy     Past Surgical History  Procedure Date  . Cesarian section   . Cesarean section     Family History  Problem Relation Age of Onset  . Anesthesia problems Neg Hx     History  Substance Use Topics  . Smoking status: Former Smoker    Types: Cigarettes  . Smokeless tobacco: Never Used   Comment: quit 2005  . Alcohol Use: No    Allergies: No Known Allergies  Prescriptions prior to admission  Medication Sig Dispense Refill  . Prenatal Vit-Fe Fumarate-FA (PRENATAL COMPLETE) 14-0.4 MG TABS Take 1 tablet by mouth daily.  60 each  0    Review of Systems  Constitutional: Negative for fever, chills and malaise/fatigue.  Eyes: Negative for blurred vision.  Respiratory: Negative for cough and shortness of breath.   Cardiovascular: Negative for chest pain.  Gastrointestinal: Positive for abdominal pain. Negative for heartburn, nausea and vomiting.  Genitourinary: Negative for dysuria, urgency and frequency.  Musculoskeletal: Negative.   Neurological: Negative for dizziness and headaches.  Psychiatric/Behavioral: Negative for depression.   Physical Exam   Blood pressure 112/72, pulse 98, temperature 98 F (36.7 C), resp. rate 20, height 5\' 3"  (1.6 m), weight 79.924 kg (176 lb 3.2 oz), last  menstrual period 05/06/2011.  Physical Exam  Nursing note and vitals reviewed. Constitutional: She is oriented to person, place, and time. She appears well-developed and well-nourished.  Neck: Normal range of motion.  Cardiovascular: Normal rate, regular rhythm and normal heart sounds.   Respiratory: Effort normal and breath sounds normal.  GI: Soft.  Musculoskeletal: Normal range of motion.  Neurological: She is alert and oriented to person, place, and time.  Skin: Skin is warm and dry.  Psychiatric: She has a normal mood and affect. Her behavior is normal. Judgment and thought content normal.  Pelvic exam deferred  Recent Results (from the past 168 hour(s))  HCG, QUANTITATIVE, PREGNANCY   Collection Time   07/14/11 10:00 PM      Component Value Range   hCG, Beta Chain, Quant, S 1728 (*) <5 (mIU/mL)  HCG, QUANTITATIVE, PREGNANCY   Collection Time   07/16/11  8:36 AM      Component Value Range   hCG, Beta Chain, Quant, S 1222 (*) <5 (mIU/mL)  ABO/RH   Collection Time   07/16/11  8:36 AM      Component Value Range   ABO/RH(D) B POS    CBC   Collection Time   07/16/11  8:36 AM      Component Value Range   WBC 7.2  4.0 - 10.5 (K/uL)   RBC 4.22  3.87 - 5.11 (MIL/uL)   Hemoglobin 11.7 (*) 12.0 - 15.0 (g/dL)  HCT 35.2 (*) 36.0 - 46.0 (%)   MCV 83.4  78.0 - 100.0 (fL)   MCH 27.7  26.0 - 34.0 (pg)   MCHC 33.2  30.0 - 36.0 (g/dL)   RDW 19.1  47.8 - 29.5 (%)   Platelets 202  150 - 400 (K/uL)  HCG, QUANTITATIVE, PREGNANCY   Collection Time   07/18/11  6:56 AM      Component Value Range   hCG, Beta Chain, Quant, S 444 (*) <5 (mIU/mL)    MAU Course  Procedures Quantitative HCG, Percocet 5/325--2 tabs, Ibuprofen 600 mg    Assessment and Plan  Incomplete SAB  Discussed labs and assessment with Dr Shawnie Pons Plan for expectant management based on significant drop in quantitative HCG D/C home with bleeding precautions Percocet 5/325 Q 4 hours plus Ibuprofen for pain Return to MAU  in 48 hours for repeat quantitative HCG  LEFTWICH-KIRBY, Kesi Perrow 07/18/2011, 8:48 AM

## 2011-07-20 ENCOUNTER — Inpatient Hospital Stay (HOSPITAL_COMMUNITY)
Admission: AD | Admit: 2011-07-20 | Discharge: 2011-07-20 | Disposition: A | Discharge status: Home / Self Care | Payer: Self-pay | Source: Ambulatory Visit | Attending: Obstetrics & Gynecology | Admitting: Obstetrics & Gynecology

## 2011-07-20 DIAGNOSIS — O039 Complete or unspecified spontaneous abortion without complication: Secondary | ICD-10-CM

## 2011-07-20 DIAGNOSIS — O034 Incomplete spontaneous abortion without complication: Secondary | ICD-10-CM | POA: Insufficient documentation

## 2011-07-20 NOTE — Discharge Instructions (Signed)
Aborto (aborto espontneo) (Miscarriage [Spontaneous Miscarriage]) Se denomina aborto a la prdida del beb antes de la vigsima semana de embarazo. Ocurren en el 15 al 20% de los embarazos. La mayor parte sucede en las primeras 13 semanas. En trminos mdicos se denomina aborto espontneo o prdida prematura del embarazo. No es necesario realizar un tratamiento adicional cuando el aborto es completo y todos los componentes de la concepcin se han eliminado. Casi siempre puede tratar de embarazarse nuevamente tan pronto el profesional la autorice. CAUSAS  La mayora de las causas no se conocen.   Problemas genticos como cromosomas anormales, insuficientes o excesivos.   Infeccin en el cuello del tero.   tero de forna anormal, fibromas o anormalidades congnitas.   Problemas hormonales.   Problemas mdicos.   El cuello del tero es incompetente, los tejidos no son lo suficientemente fuertes como para contener el embarazo.   El hbito de fumar, ingesta excesiva de alcohol y consumo de drogas.   Traumatismos.  SNTOMAS  Hemorragia o manchado por la vagina.   Clicos en la zona abdominal inferior.   Eliminacin de lquido por la vagina, con o sin clicos o dolor.   Eliminacin de tejidos fetales.  TRATAMIENTO  Si se eliminan todos los tejidos del tero no es necesario realizar tratamiento.   Si no se eliminan completamente los tejidos uterinos o la placenta queda dentro del tero (aborto incompleto), pueden infectarse. En muchos casos es necesario realizar dilatacin y curetaje por succin o legrado del tero para retirar los restos de tejido. El procedimiento slo se lleva a cabo cuando el profesional sabe que no existe posibilidad de que el embarazo contine. Esta decisin se toma luego de realizar un examen fsico, se realiza un test de embarazo y ste es negativo, los resultados de los anlisis de sangre y de la ecografa indican que hay un feto muerto o no hay feto en  desarrollo debido a algn problema ocurrido en el momento de la concepcin (cuando el esperma y el vulo se unen).   Podr ser necesario que tome medicamentos, antibiticos si hay infeccin o medicamentos para contraer el tero si tiene una hemorragia abundante.   Si su sangre es Rh negativa y su pareja es Rh positivo, necesitar la vacuna Rho-gam (inmunoglobulina). De este modo evitar sufrir problemas de compatibilidad Rh en futuros embarazos.  INSTRUCCIONES PARA EL CUIDADO DOMICILIARIO  El mdico podr indicarle reposo en cama (slo podr levantarse para ir al bao). Podr autorizarla a realizar una actividad ligera. En este momento usted necesitar hacer algunos arreglos para que otra persona se ocupe del cuidado de los nios y de otras responsabilidades adicionales.   Lleve un registro de la cantidad y la saturacin de las toallas higinicas que utiliza cada da. Anote esta informacin.   No use tampones. No se haga duchas vaginales ni tenga relaciones sexuales hasta que el mdico la autorice.   Tome slo medicamentos de venta libre o prescriptos para aliviar el dolor, las molestias o para bajar la fiebre, segn las indicaciones del mdico.   No tome aspirina, ya que puede causar hemorragias.   Es muy importante que cumpla con todas las citas de control para realizar nuevas evaluaciones y continuar el tratamiento.   Informe a su mdico si sufre violencia domstica.   Las mujeres con tipo sanguneo Rh negativo (A, B, AB o 0 negativo deben recibir una droga denominada inmuno globulina Rh (D) (RhoGam ) Este medicamento ayuda a proteger al feto de futuros problemas que puedan   ocurrir si una madre Rh negativo da a luz a un beb Rh positivo.   Si usted o su pareja sufren culpa o duelo intenso, hable con su mdico para buscar la ayuda psicolgica que los ayude a enfrentar la prdida del embarazo. Permtase el tiempo suficiente de duelo antes de quedar embarazada nuevamente.  SOLICITE ATENCIN  MDICA DE INMEDIATO SI:  Siente calambres intensos en el estmago, en la espalda o en el vientre (abdomen).   Tiene fiebre.   Elimina cogulos o tejidos grandes. Guarde una muestra de esos tejidos para que el profesional lo inspeccione.   La hemorragia aumenta.   Se siente mareada, dbil o tiene episodios de desmayo.   Comienza a sentir escalofros  Document Released: 12/17/2004 Document Revised: 02/26/2011 [employer] Patient Information 2012 [employer]. 

## 2011-07-20 NOTE — MAU Note (Signed)
Patient to MAU for repeat BHCG. Patient state she continues to have a little abdominal pain and bleeding like a period.

## 2011-07-20 NOTE — MAU Provider Note (Signed)
  History     CSN: [id number]  Arrival date and time: 07/20/11 4098   None     Chief Complaint  Patient presents with  . Follow-up   HPI Brooke Lam is 31 y.o. [id number] [redacted]w[redacted]d weeks presenting for repeat BHCGs.  She was initially seen 4/23 with BHCG 1728, on 4/25 1222 and on 4/23 444.  Blood type B+. Early ultrasound on 4/22 at [facility] showed early IUGS.  Brooke Lam interpreter in room.  The patient is having cramping that is intermittent.  Didn't get her Rx for percocet because she couldn't afford it.  She is having bleeding that follows the cramping.  Is using Ibuprofen for discomfort.    Past Medical History  Diagnosis Date  . Ectopic pregnancy     Past Surgical History  Procedure Date  . Cesarian section   . Cesarean section     Family History  Problem Relation Age of Onset  . Anesthesia problems Neg Hx     History  Substance Use Topics  . Smoking status: Former Smoker    Types: Cigarettes  . Smokeless tobacco: Never Used   Comment: quit 2005  . Alcohol Use: No    Allergies: No Known Allergies  Prescriptions prior to admission  Medication Sig Dispense Refill  . ibuprofen (ADVIL,MOTRIN) 600 MG tablet Take 1 tablet (600 mg total) by mouth every 6 (six) hours as needed for pain.  30 tablet  0  . oxyCODONE-acetaminophen (PERCOCET) 5-325 MG per tablet Take 1 tablet by mouth every 4 (four) hours as needed for pain.  10 tablet  0  . Prenatal Vit-Fe Fumarate-FA (PRENATAL COMPLETE) 14-0.4 MG TABS Take 1 tablet by mouth daily.  60 each  0    Review of Systems  Constitutional: Negative for fever and chills.  Gastrointestinal: Positive for abdominal pain (intermittent cramping).  Genitourinary:       + for vaginal bleeding off and on   Physical Exam   Blood pressure 104/68, pulse 70, temperature 98.2 F (36.8 C), temperature source Oral, resp. rate 16, last menstrual period 05/06/2011.  Physical Exam  Constitutional: She is oriented to person, place, and time. She  appears well-developed and well-nourished. No distress.  HENT:  Head: Normocephalic.  Neck: Normal range of motion.  Respiratory: Effort normal.  Genitourinary:       Not indicated  Neurological: She is alert and oriented to person, place, and time.  Skin: Skin is warm and dry.   Results for orders placed during the hospital encounter of 07/20/11 (from the past 24 hour(s))  HCG, QUANTITATIVE, PREGNANCY     Status: Abnormal   Collection Time   07/20/11  7:27 AM      Component Value Range   hCG, Beta Chain, Quant, S 190 (*) <5 (mIU/mL)   MAU Course  Procedures  MDM   Assessment and Plan  A:  Miscarriage with falling BHCG levels  P:  Follow up in the GYN CLINIC in 1 week for BHCG.     Instructions given to return for heavy bleeding, pain, or fever. Brooke Lam,EVE M 07/20/2011, 8:06 AM

## 2011-07-21 NOTE — MAU Provider Note (Signed)
Chart reviewed and agree with management and plan.  

## 2011-07-23 NOTE — MAU Provider Note (Signed)
Medical Screening exam and patient care preformed by advanced practice provider.  Agree with the above management.  

## 2011-07-24 ENCOUNTER — Ambulatory Visit (HOSPITAL_COMMUNITY)
Admission: RE | Admit: 2011-07-24 | Discharge: 2011-07-24 | Disposition: A | Discharge status: Home / Self Care | Payer: Self-pay | Source: Ambulatory Visit | Attending: Emergency Medicine | Admitting: Emergency Medicine

## 2011-07-24 ENCOUNTER — Encounter (HOSPITAL_COMMUNITY): Payer: Self-pay | Admitting: *Deleted

## 2011-07-24 ENCOUNTER — Inpatient Hospital Stay (HOSPITAL_COMMUNITY)
Admission: AD | Admit: 2011-07-24 | Discharge: 2011-07-24 | Disposition: A | Discharge status: Home / Self Care | Payer: Self-pay | Source: Ambulatory Visit | Attending: Obstetrics & Gynecology | Admitting: Obstetrics & Gynecology

## 2011-07-24 DIAGNOSIS — O034 Incomplete spontaneous abortion without complication: Secondary | ICD-10-CM

## 2011-07-24 DIAGNOSIS — O3680X0 Pregnancy with inconclusive fetal viability, not applicable or unspecified: Secondary | ICD-10-CM | POA: Insufficient documentation

## 2011-07-24 DIAGNOSIS — O039 Complete or unspecified spontaneous abortion without complication: Secondary | ICD-10-CM | POA: Insufficient documentation

## 2011-07-24 LAB — CBC
HCT: 36.4 % (ref 36.0–46.0)
Hemoglobin: 11.9 g/dL — ABNORMAL LOW (ref 12.0–15.0)
MCH: 27.1 pg (ref 26.0–34.0)
MCV: 82.9 fL (ref 78.0–100.0)
RBC: 4.39 MIL/uL (ref 3.87–5.11)

## 2011-07-24 MED ORDER — MISOPROSTOL 200 MCG PO TABS: 400.0000 ug | ORAL_TABLET | Freq: Four times a day (QID) | ORAL | Status: DC

## 2011-07-24 MED ORDER — MISOPROSTOL 200 MCG PO TABS: 200.0000 ug | ORAL_TABLET | Freq: Four times a day (QID) | ORAL | Status: DC

## 2011-07-24 NOTE — MAU Note (Signed)
Eda and [place] with pt to discuss labs and [place] results.  Plan of care discussed.

## 2011-07-24 NOTE — MAU Note (Signed)
Pt states via Eda, spanish interpreter that she is still having bleeding like a menstrual cycle. Notes small clots. Here from u/s with probable missed AB. Has chest/back pain upon waking 3 days ago, then again today.

## 2011-07-24 NOTE — MAU Provider Note (Signed)
  History     CSN: [id number]  Arrival date and time: 07/24/11 1139   None     Chief Complaint  Patient presents with  . Follow-up   HPI  Pt has been follow with serial HCGs since 4/23 for bleeding in pregnancy.  She had an initial HCG on 4/23- 1728,4/25-1222,4/27-444,4/29-190.  Pt continues to have bleeding and cramping.  She now has developed a cough and congestion and sore throat.  Past Medical History  Diagnosis Date  . Ectopic pregnancy     Past Surgical History  Procedure Date  . Cesarian section   . Cesarean section     Family History  Problem Relation Age of Onset  . Anesthesia problems Neg Hx     History  Substance Use Topics  . Smoking status: Former Smoker    Types: Cigarettes  . Smokeless tobacco: Never Used   Comment: quit 2005  . Alcohol Use: No    Allergies: No Known Allergies  Prescriptions prior to admission  Medication Sig Dispense Refill  . ibuprofen (ADVIL,MOTRIN) 600 MG tablet Take 1 tablet (600 mg total) by mouth every 6 (six) hours as needed for pain.  30 tablet  0  . oxyCODONE-acetaminophen (PERCOCET) 5-325 MG per tablet Take 1 tablet by mouth every 4 (four) hours as needed for pain.  10 tablet  0  . Prenatal Vit-Fe Fumarate-FA (PRENATAL COMPLETE) 14-0.4 MG TABS Take 1 tablet by mouth daily.  60 each  0    ROS Physical Exam   Blood pressure 113/69, pulse 92, temperature 98.8 F (37.1 C), temperature source Oral, resp. rate 16, height 5' 2.25" (1.581 m), weight 172 lb 6 oz (78.189 kg), last menstrual period 05/06/2011.  Physical Exam  MAU Course  Procedures Results for orders placed during the hospital encounter of 07/24/11 (from the past 24 hour(s))  CBC     Status: Abnormal   Collection Time   07/24/11 12:45 PM      Component Value Range   WBC 7.0  4.0 - 10.5 (K/uL)   RBC 4.39  3.87 - 5.11 (MIL/uL)   Hemoglobin 11.9 (*) 12.0 - 15.0 (g/dL)   HCT 40.9  81.1 - 91.4 (%)   MCV 82.9  78.0 - 100.0 (fL)   MCH 27.1  26.0 - 34.0 (pg)     MCHC 32.7  30.0 - 36.0 (g/dL)   RDW 78.2  95.6 - 21.3 (%)   Platelets 220  150 - 400 (K/uL)  HCG, QUANTITATIVE, PREGNANCY     Status: Abnormal   Collection Time   07/24/11 12:45 PM      Component Value Range   hCG, Beta Chain, Quant, S 69 (*) <5 (mIU/mL)    Discussed with pt options of management of miscarriage- expectant management vs. Cytotec Because of pt's prolonged bleeding, pt want to use Cytotec-pt has used Cytotec with another pregnancy Assessment and Plan  Miscarriage cytotec in vagina at one time (4tablets 200 mcg  -f/u in 1 week for repeat [facility] Return sooner if increase in pain or bleeding Jalaiya Oyster 07/24/2011, 12:33 PM

## 2011-07-24 NOTE — Discharge Instructions (Signed)
Aborto incompleto (Incomplete Miscarriage) Los abortos son algo frecuente en los embarazos. Le han diagnosticado que ha sufrido un aborto incompleto. Se trata de un embarazo que ha finalizado antes de la vigsima semana y algunas partes del feto o de la placenta han quedado en el tero. En algunos casos se requiere un tratamiento. La razn ms frecuente para realizar un tratamiento es el sangrado (hemorragia) continuo que generalmente ocurre. Tambin puede suceder que los tejidos que no se han expulsado se infecten. El tratamiento generalmente se realiza con un curetaje. El curetaje se lleva a cabo en el aborto incompleto como procedimiento para extraer los tejidos del embarazo que no se han expulsado. Se realizar por medio de un sencillo procedimiento de succin (curetaje por succin) o por medio de un raspado simple del interior del tero. Podr llevarse a cabo en el hospital o en el consultorio del profesional. Slo se efecta cuando el profesional tiene la certeza de que el embarazo se ha interrumpido. La certeza se determina a travs del examen fsico, una prueba de embarazo negativa y por medio de una ecografa que revele la existencia de un feto muero o partes del embarazo que hubieran quedado en el tero. En ocasiones, cuando el cuello del tero queda dilatado y an se eliminan cogulos y tejidos, el profesional podr esperar para ver si elimina todos los tejidos sin necesidad de realizar un procedimiento. Si la hemorragia persiste, podr proceder inmediatamente con el curetaje. POR QU ME SIENTO DE ESTE MODO Un aborto suele ser un perodo muy emotivo para las futuras mams; sin embargo, pueden encontrar consuelo teniendo en cuenta que no es su culpa ni la de su pareja. No ocurre por conductas inapropiadas por parte suya o de su compaero. Casi todos los abortos se producen porque el embarazo ha comenzado de un modo incorrecto. Al menos en la mitad de estos embarazos, cuando se los estudia, existe una  anormalidad cromosmica (casi siempre no hereditaria). Otros podran presentar problemas en el desarrollo del feto o de la placenta, los que no necesariamente se observan en los productos del aborto cuando se estudian al microscopio. El aborto casi nunca se produce por su culpa. Casi siempre puede tratar de embarazarse nuevamente tan pronto como el profesional la autorice. INSTRUCCIONES PARA EL CUIDADO DOMICILIARIO  El profesional puede indicarle reposo en cama (slo podr levantarse para ir al cuarto de bao), o podr permitirle que contine realizando tareas livianas. Si el curetaje no se realiza en este momento, podr necesitar organizar sus cosas con respecto al cuidado de los nios y a otras responsabilidades en caso de un tratamiento ms intensivo o de hospitalizacin.   Lleve un registro de la cantidad y la saturacin de las toallas higinicas (cunto absorben) que utiliza cada da. Anote esta informacin.   No usetampones. No se haga duchas vaginales ni tenga relaciones sexuales hasta que el mdico la autorice.   Es muy importante que cumpla con todas las citas de seguimiento para una nueva evaluacin y para continuar el tratamiento.   Las mujeres con tipo sanguneo Rh negativo (A, B, AB o 0 negativo deben recibir una droga denominada inmuno globulina Rh (D) (RhoGam ) Este medicamento ayuda a proteger al feto de futuros problemas que puedan ocurrir si una madre Rh negativo da a luz a un beb Rh positivo.  CONCIERTE UNA CITA CON EL MDICO, O CON EL PROFESIONAL AL QUE LA HAN DERIVADO O VAYA A LA SALA DE EMERGENCIAS INMEDIATAMENTE SI:  Siente clicos intensos en el estmago,   en la espalda o en el abdomen.   Presenta una temperatura elevada inexplicable (antela).   Elimina cogulos grandes o tejidos (conserve una parte de los tejidos para que el profesional los observe).   La hemorragia aumenta o se siente mareada, dbil o tiene episodios de desmayos.  EST SEGURO QUE:   Comprende las  instrucciones para el alta mdica.   Controlar su enfermedad.   Solicitar atencin mdica de inmediato segn las indicaciones.  Document Released: 03/09/2005 Document Revised: 02/26/2011 [employer] Patient Information 2012 [employer]. 

## 2011-07-24 NOTE — MAU Provider Note (Signed)
Attestation of Attending Supervision of Advanced Practitioner: Evaluation and management procedures were performed by the OB Fellow/PA/CNM/NP under my supervision and collaboration. Chart reviewed, and agree with management and plan.  Valrie Jia, M.D. 07/24/2011 7:40 PM   

## 2011-07-27 ENCOUNTER — Other Ambulatory Visit: Payer: Self-pay

## 2011-07-27 DIAGNOSIS — O039 Complete or unspecified spontaneous abortion without complication: Secondary | ICD-10-CM

## 2011-07-31 ENCOUNTER — Encounter (HOSPITAL_COMMUNITY): Payer: Self-pay | Admitting: *Deleted

## 2011-07-31 ENCOUNTER — Inpatient Hospital Stay (HOSPITAL_COMMUNITY)
Admission: AD | Admit: 2011-07-31 | Discharge: 2011-07-31 | Disposition: A | Discharge status: Home / Self Care | Payer: Self-pay | Source: Ambulatory Visit | Attending: Obstetrics & Gynecology | Admitting: Obstetrics & Gynecology

## 2011-07-31 DIAGNOSIS — O039 Complete or unspecified spontaneous abortion without complication: Secondary | ICD-10-CM

## 2011-07-31 DIAGNOSIS — O021 Missed abortion: Secondary | ICD-10-CM | POA: Insufficient documentation

## 2011-07-31 NOTE — MAU Note (Signed)
Here for f/u bhcg.  Having some itching- started 2 days ago.  Coffee colored d/c- small amt.  Did cytotec placement 05/03 - that night.  Had cramping and normal bleeding, bleeding now less and pain went away after.

## 2011-07-31 NOTE — Discharge Instructions (Signed)
Aborto espontneo (Miscarriage) Una interrupcin temprana del embarazo o aborto involuntario (aborto espontneo) es un problema frecuente. Generalmente ocurre cuando el embarazo no se desarrolla normalmente. Es muy improbable que usted o su pareja hayan hecho algo para que sucediera esto, aunque el humo del [place], las enfermedades de transmisin sexual, la ingesta excesiva de alcohol o abuso de drogas pueden aumentar el riesgo. Burna Cash causas son:  Anormalidades del tero.   Problemas hormonales u otros trastornos.   Traumatismos y problemas genticos (cromosmicos).  Sufrir un aborto espontneo no modifica sus probabilidades de tener un embarazo normal en el futuro. El mdico le aconsejar cundo puede tratar de quedar embarazada otra vez. DESPUS DE UN ABORTO ESPONTNEO  Un aborto espontneo es inevitable cuando hay una hemorragia abundante y continua, clicos, dilatacin del cuello del tero o se eliminan tejidos del Psychiatrist. La hemorragia y los clicos continuarn hasta que todos los tejidos se hayan retirado del tero.   A veces el tero no elimina completamente todos los tejidos, entonces es necesario administrar medicamentos o realizar una dilatacin y curetaje para retirar los restos de tejidos del Psychiatrist. La dilatacin y curetaje raspa o succiona los tejidos para retirarlos.   Si usted es Rh negativa, usted puede Gaffer inmunoglobulina Rh para evitar problemas de Rh.   Le darn medicamentos para combatir infecciones si el aborto fue provocado por un germen.  INSTRUCCIONES PARA EL CUIDADO DOMICILIARIO  Deber reposar [employer] siguientes 2 a 3 das.   No tome baos de inmersin ni se haga duchas vaginales y no coloque nada en la vagina incluyendo tampones.   No mantenga relaciones sexuales hasta que su mdico lo autorice.   Evite los ejercicios o las actividades extenuantes hasta que su mdico lo autorice.   Guarde cualquier secrecin vaginal que pueda parecer un  tejido. Consulte al mdico si es necesario inspeccionar ese tejido.   Si usted o su pareja sufren culpa o duelo intenso, hable con su mdico para buscar la [employer] ayude a Runner, broadcasting/film/video la prdida del Psychiatrist.   Permtase el tiempo suficiente de duelo antes de quedar embarazada nuevamente.  SOLICITE ATENCIN MDICA DE INMEDIATO SI:  Brett Fairy secrecin vaginal anormal, abundante o con mal olor.   Siente dolor abdominal o plvico continuo.   La temperatura oral le sube a ms de 102 F (38.9 C) y no puede bajarla con medicamentos.   Siente debilidad intensa, se desmaya, o sufre vmitos.   Comienza a sentir escalofros.   Es vctima de Advertising copywriter.  ASEGURESE DE QUE:  Comprende estas instrucciones.   Controlar su enfermedad.   Solicitar ayuda inmediatamente si no mejora o si empeora.  Document Released: 03/09/2005 Document Revised: 02/26/2011 [facility] Patient Information 2012 [place], [place].Miscarriage An early pregnancy loss or spontaneous abortion (miscarriage) is a common problem. This usually happens when the pregnancy is not developing normally. It is very unlikely that you or your partner did anything to cause this, although cigarette smoking, a sexually transmitted disease, excessive alcohol use, or drug abuse can increase the risk. Other causes are:  Abnormalities of the uterus.   Hormone or medical problems.   Trauma or genetic (chromosome) problems.  Having a miscarriage does not change your chances of having a normal pregnancy in the future. Your caregiver will advise you when it is safe to try to get pregnant again. AFTER A MISCARRIAGE  A miscarriage is inevitable when there is continual, heavy vaginal bleeding; cramping; dilation of the cervix; or passing of any  pregnancy tissue. Bleeding and cramping will usually continue until all the tissue has been removed from the womb (uterus).   Often the uterus does not clean itself out completely.  A medication or a D&C procedure is needed to loosen or remove the pregnancy tissue from the uterus. A D&C scrapes or suctions the tissue out.   If you are RH negative, you may need to have Rh immune globulin to avoid Rh problems.   You may be given medication to fight an infection if the miscarriage was due to an infection.  HOME CARE INSTRUCTIONS   You should rest in bed for the next 2 to 3 days.   Do not take tub baths or put anything in your vagina, including tampons or a douche.   Do not have sex until your caregiver approves.   Avoid exercise or heavy activities until directed by your caregiver.   Save any vaginal discharge that looks like tissue. Ask your caregiver if he or she wants to inspect the discharge.   If you and your partner are having problems with guilt or grieving, talk to your caregiver or get counseling to help you understand and cope with your pregnancy loss.   Allow enough time to grieve before trying to get pregnant again.  SEEK IMMEDIATE MEDICAL CARE IF:   You have persistent heavy bleeding or a bad smelling vaginal discharge.   You have continued abdominal or pelvic pain.   You have an oral temperature above 102 F (38.9 C), not controlled by medicine.   You have severe weakness, fainting, or keep throwing up (vomiting).   You develop chills.   You are experiencing domestic violence.  MAKE SURE YOU:   Understand these instructions.   Will watch your condition.   Will get help right away if you are not doing well or get worse.  Document Released: 04/16/2004 Document Revised: 02/26/2011 Document Reviewed: 03/01/2008 [facility] Patient Information 2012 [place], [place].

## 2011-07-31 NOTE — MAU Provider Note (Signed)
History   Chief Complaint:  Follow-up   Brooke Lam is  31 y.o. [id number] Patient's last menstrual period was 05/06/2011.Brooke Lam Patient is here for follow up of quantitative HCG and ongoing surveillance of pregnancy status.  She is 1 week s/p cytotec for missed AB.  Since her last visit, the patient is without new complaint.   The patient reports bleeding as  none now.    General ROS:  negative  Her previous Quantitative HCG values are:  CM 40.0 (H) CM 69 (H) CM 190 (H) CM 444 (H) CM 1222 (H) CM 1728 (H)     Physical Exam   Blood pressure 106/65, pulse 65, temperature 98 F (36.7 C), temperature source Oral, resp. rate 18, last menstrual period 05/06/2011.  Focused Gynecological Exam: examination not indicated  Labs: Recent Results (from the past 24 hour(s))  HCG, QUANTITATIVE, PREGNANCY   Collection Time   07/31/11  7:41 AM      Component Value Range   hCG, Beta Chain, Quant, S 19 (*) <5 (mIU/mL)     Assessment: Missed AB s/p cytotect 1 week ago with appropriately falling quants   Plan: Discharge home FU in Clinic in 4-6 weeks. Clinic staff to call with appt date/time  Neilson Oehlert E. 07/31/2011, 8:47 AM

## 2011-08-03 NOTE — MAU Provider Note (Signed)
Agree with above note.  Ketty Bitton 08/03/2011 12:12 PM   

## 2011-08-27 ENCOUNTER — Inpatient Hospital Stay (HOSPITAL_COMMUNITY): Payer: Self-pay

## 2011-08-27 ENCOUNTER — Inpatient Hospital Stay (HOSPITAL_COMMUNITY)
Admission: AD | Admit: 2011-08-27 | Discharge: 2011-08-27 | Disposition: A | Discharge status: Home / Self Care | Payer: Self-pay | Source: Ambulatory Visit | Attending: Obstetrics & Gynecology | Admitting: Obstetrics & Gynecology

## 2011-08-27 ENCOUNTER — Encounter (HOSPITAL_COMMUNITY): Payer: Self-pay

## 2011-08-27 DIAGNOSIS — N926 Irregular menstruation, unspecified: Secondary | ICD-10-CM

## 2011-08-27 DIAGNOSIS — N938 Other specified abnormal uterine and vaginal bleeding: Secondary | ICD-10-CM | POA: Insufficient documentation

## 2011-08-27 DIAGNOSIS — R319 Hematuria, unspecified: Secondary | ICD-10-CM | POA: Insufficient documentation

## 2011-08-27 DIAGNOSIS — N939 Abnormal uterine and vaginal bleeding, unspecified: Secondary | ICD-10-CM

## 2011-08-27 DIAGNOSIS — N949 Unspecified condition associated with female genital organs and menstrual cycle: Secondary | ICD-10-CM | POA: Insufficient documentation

## 2011-08-27 LAB — URINALYSIS, ROUTINE W REFLEX MICROSCOPIC
Glucose, UA: NEGATIVE mg/dL
Ketones, ur: NEGATIVE mg/dL
Leukocytes, UA: NEGATIVE
Nitrite: NEGATIVE
Protein, ur: NEGATIVE mg/dL

## 2011-08-27 LAB — CBC
MCH: 27.3 pg (ref 26.0–34.0)
MCHC: 33.1 g/dL (ref 30.0–36.0)
Platelets: 220 10*3/uL (ref 150–400)

## 2011-08-27 LAB — URINE CULTURE
Culture: NO GROWTH
Special Requests: NORMAL

## 2011-08-27 MED ORDER — MEDROXYPROGESTERONE ACETATE 10 MG PO TABS: 10.0000 mg | ORAL_TABLET | Freq: Every day | ORAL | Status: DC

## 2011-08-27 NOTE — MAU Provider Note (Signed)
Medical Screening exam and patient care preformed by advanced practice provider.  Agree with the above management.  

## 2011-08-27 NOTE — MAU Note (Signed)
Patient states she has been having bleeding every day since the miscarriage about one month ago but heavier for about 3 days. . She had taken Cytotec. Has been having abdominal pain and headache for about 5 days.

## 2011-08-27 NOTE — Discharge Instructions (Signed)
Hemorragia uterina disfuncional (Abnormal Uterine Bleeding) La hemorragia uterina disfuncional puede tener muchas causas. En algunos casos se mejora simplemente con un tratamiento. En otros casos puede ser ms grave. Hay varios tipos de hemorragia que se consideran disfuncionales. Ellas son:  Doristine Mango perodos.   Hemorragia durante las [employer].   Prdida de sangre en cualquier momento del ciclo menstrual.   Hemorragia abundante o ms que lo habitual.   Sangrado luego de la menopausia.  CAUSAS Hay numerosas causas que originan este problema. Puede ocurrir en adolescentes, mujeres embarazadas, en aquellas que se encuentran en su vida reproductiva y en las que han llegado a la menopausia. El mdico buscar las causas ms comunes, de acuerdo a su edad, los signos y los sntomas que presenta y su Dance movement psychotherapist. En la [employer] no reviste gravedad y se trata sin dificultad. An en los casos ms graves, como el cncer en los rganos femeninos. puede tratarse adecuadamente si se detecta en etapas tempranas. Es por eso que todos los tipos de hemorragia deben evaluarse y tratarse lo antes posible. DIAGNSTICO El diagnstico de la causa puede requerir varios tipos de pruebas. El profesional podr:  Education officer, environmental una historia completa del tipo de hemorragia.   Realizar un examen fsico completo y un papanicolau.   Indicar una ecografa de abdomen que muestre una imagen de los rganos femeninos y de la pelvis.   Podr inyectar una sustancia de contraste en el tero y las trompas de Falopio y tomar radiografas (histerosalpingografa).   Inyectar lquido en el tero para realizar una ecografa (sonohisterografa)   Indicar una tomografa computada para examinar los rganos femeninos y la pelvis.   Indicar una resonancia magntica para examinar los rganos femeninos y la pelvis. Este procedimiento no implica la aplicacin de rayos X.   Observar el interior del  tero con un dispositivo similar a un telescopo que tiene una luz en un extremo (histeroscopio).   Realizar un raspado del tero para obtener muestras de tejido y examinarlas (dilatacin y curetaje).   Observar el interior de la pelvis con un dispositivo similar a un telescopio que tiene una luz en un extremo (laparoscopio). Esto se lleva a cabo a travs de un corte muy pequeo (incisin) en el abdomen.  TRATAMIENTO El tratamiento depender de la causa. Podr incluir:  No tomar medidas y dejar que el problema se solucione por s mismo.   Tratamiento hormonal.   Pldoras anticonceptivas.   Tratamiento del problema mdico que causa la hemorragia.   Laparoscopa:   Ciruga mayor o menor.   Destruccin del tapizado interno de la pared del tero con corriente elctrica, rayo lser, congelamiento o calor (ablacin uterina).  INSTRUCCIONES PARA EL CUIDADO DOMICILIARIO  Siga las recomendaciones de su mdico acerca de cmo tratar el problema.   Consulte con su mdico si no tiene su perodo menstrual y piensa que puede estar [place].   Si presenta una hemorragia abundante, cuente el nmero de apsitos o tampones que [place] y con qu frecuencia debe cambiarlos. Convrselo con el profesional que la asiste.   Evite las relaciones sexuales hasta que el problema sea controlado.  SOLICITE ATENCIN MDICA SI:  Sufre algn tipo de hemorragia disfuncional como las ya mencionadas.   Se siente mareada.   Tiene ms de 17 aos y an no ha tenido su perodo menstrual.  SOLICITE ATENCIN MDICA DE INMEDIATO SI:  Se desmaya.   Debe cambiarse el apsito o el tampn cada 15 a 30 minutos.  Siente dolor abdominal.   La temperatura se eleva por encima de 100 F (37.8 C).   Berenice Primas o se siente dbil.   Elimina cogulos grandes por la vagina.   Si tiene Programme researcher, broadcasting/film/video (nuseas) y vmitos.  Document Released: 03/09/2005 Document Revised: 02/26/2011 [facility] Patient Information 2012  [place], [place].Hemorragia uterina disfuncional (Abnormal Uterine Bleeding) La hemorragia uterina disfuncional puede tener muchas causas. En algunos casos se mejora simplemente con un tratamiento. En otros casos puede ser ms grave. Hay varios tipos de hemorragia que se consideran disfuncionales. Ellas son:  Doristine Mango perodos.   Hemorragia durante las [employer].   Prdida de sangre en cualquier momento del ciclo menstrual.   Hemorragia abundante o ms que lo habitual.   Sangrado luego de la menopausia.  CAUSAS Hay numerosas causas que originan este problema. Puede ocurrir en adolescentes, mujeres embarazadas, en aquellas que se encuentran en su vida reproductiva y en las que han llegado a la menopausia. El mdico buscar las causas ms comunes, de acuerdo a su edad, los signos y los sntomas que presenta y su Dance movement psychotherapist. En la [employer] no reviste gravedad y se trata sin dificultad. An en los casos ms graves, como el cncer en los rganos femeninos. puede tratarse adecuadamente si se detecta en etapas tempranas. Es por eso que todos los tipos de hemorragia deben evaluarse y tratarse lo antes posible. DIAGNSTICO El diagnstico de la causa puede requerir varios tipos de pruebas. El profesional podr:  Education officer, environmental una historia completa del tipo de hemorragia.   Realizar un examen fsico completo y un papanicolau.   Indicar una ecografa de abdomen que muestre una imagen de los rganos femeninos y de la pelvis.   Podr inyectar una sustancia de contraste en el tero y las trompas de Falopio y tomar radiografas (histerosalpingografa).   Inyectar lquido en el tero para realizar una ecografa (sonohisterografa)   Indicar una tomografa computada para examinar los rganos femeninos y la pelvis.   Indicar una resonancia magntica para examinar los rganos femeninos y la pelvis. Este procedimiento no implica la aplicacin de rayos X.   Observar el  interior del tero con un dispositivo similar a un telescopo que tiene una luz en un extremo (histeroscopio).   Realizar un raspado del tero para obtener muestras de tejido y examinarlas (dilatacin y curetaje).   Observar el interior de la pelvis con un dispositivo similar a un telescopio que tiene una luz en un extremo (laparoscopio). Esto se lleva a cabo a travs de un corte muy pequeo (incisin) en el abdomen.  TRATAMIENTO El tratamiento depender de la causa. Podr incluir:  No tomar medidas y dejar que el problema se solucione por s mismo.   Tratamiento hormonal.   Pldoras anticonceptivas.   Tratamiento del problema mdico que causa la hemorragia.   Laparoscopa:   Ciruga mayor o menor.   Destruccin del tapizado interno de la pared del tero con corriente elctrica, rayo lser, congelamiento o calor (ablacin uterina).  INSTRUCCIONES PARA EL CUIDADO DOMICILIARIO  Siga las recomendaciones de su mdico acerca de cmo tratar el problema.   Consulte con su mdico si no tiene su perodo menstrual y piensa que puede estar [place].   Si presenta una hemorragia abundante, cuente el nmero de apsitos o tampones que [place] y con qu frecuencia debe cambiarlos. Convrselo con el profesional que la asiste.   Evite las relaciones sexuales hasta que el problema sea controlado.  SOLICITE ATENCIN MDICA SI:  Sufre algn tipo  de hemorragia disfuncional como las ya mencionadas.   Se siente mareada.   Tiene ms de 50 aos y an no ha tenido su perodo menstrual.  SOLICITE ATENCIN MDICA DE INMEDIATO SI:  Se desmaya.   Debe cambiarse el apsito o el tampn cada 15 a 30 minutos.   Siente dolor abdominal.   La temperatura se eleva por encima de 100 F (37.8 C).   Berenice Primas o se siente dbil.   Elimina cogulos grandes por la vagina.   Si tiene Programme researcher, broadcasting/film/video (nuseas) y vmitos.  Document Released: 03/09/2005 Document Revised: 02/26/2011 [facility] Patient  Information 2012 [place], [place].

## 2011-08-27 NOTE — MAU Provider Note (Signed)
Brooke Lam [id number] Chief Complaint  Patient presents with  . Abdominal Pain  . Vaginal Bleeding     First Provider Initiated Contact with Patient 08/27/11 [phone]      SUBJECTIVE  HPI: Pt presents to MAU reporting continued moderate vaginal bleeding and cramping since incomplete SAB. She was Dx w/ bleeding in early pregnancy in March at [facility]. In April she presented to MAU w/ bleeding in pregnancy. Quants were trending down, but since bleeding continued, she was given cytotec 07/24/11 and instructed to F/U in Gyn clinic in 1 month. She states she had heavy bleeding a day or two after cytotec and mild-moderate bleeding and cramping since then. Pain well-controlled w/ ibuprofen. She denies fever, chills, urinary complaints, GI complaints, flank pain, change in partners. No Hx of DUB. TTC.  Past Medical History  Diagnosis Date  . Ectopic pregnancy    Past Surgical History  Procedure Date  . Cesarian section   . Cesarean section    History   Social History  . Marital Status: Single    Spouse Name: N/A    Number of Children: N/A  . Years of Education: N/A   Occupational History  . Not on file.   Social History Main Topics  . Smoking status: Former Smoker    Types: Cigarettes  . Smokeless tobacco: Never Used   Comment: quit 2005  . Alcohol Use: No  . Drug Use: No  . Sexually Active: Not Currently   Other Topics Concern  . Not on file   Social History Narrative  . No narrative on file   No current facility-administered medications on file prior to encounter.   Current Outpatient Prescriptions on File Prior to Encounter  Medication Sig Dispense Refill  . misoprostol (CYTOTEC) 200 MCG tablet Take 1 tablet (200 mcg total) by mouth 4 (four) times daily. Put 4 tablets in vagina as directed  4 tablet  0   No Known Allergies  ROS: Pertinent items in HPI  OBJECTIVE Blood pressure 112/75, pulse 71, temperature 98.9 F (37.2 C), temperature source Oral, resp. rate 16,  height 5\' 2"  (1.575 m), weight 79.107 kg (174 lb 6.4 oz), last menstrual period 05/06/2011, SpO2 97.00%, unknown if currently breastfeeding.  GENERAL: Well-developed, well-nourished female in no acute distress.  HEENT: Normocephalic HEART: normal rate RESP: normal effort ABDOMEN: Soft, nontender EXTREMITIES: Nontender, no edema NEURO: Alert and oriented SPECULUM EXAM: NEFG, small amount of blood noted coming from os, cervix clean BIMANUAL: cervix closed; uterus non-tender, non-pregnant size; no adnexal tenderness or masses.   LAB RESULTS Results for FAVOR, KREH (MRN [id number]) as of 08/27/2011 09:35  Ref. Range 06/03/2011 11:15 07/14/2011 22:00 07/16/2011 08:36 07/18/2011 06:56 07/20/2011 07:27 07/24/2011 12:45 07/27/2011 10:26 07/31/2011 07:41 08/27/2011 08:15  hCG, Beta Chain, Quant, S Latest Range: <5 mIU/mL 140 (H) 1728 (H) 1222 (H) 444 (H) 190 (H) 69 (H) 40.0 19 (H) <1   Results for orders placed during the hospital encounter of 08/27/11 (from the past 24 hour(s))  URINALYSIS, ROUTINE W REFLEX MICROSCOPIC     Status: Abnormal   Collection Time   08/27/11  8:10 AM      Component Value Range   Color, Urine YELLOW  YELLOW    APPearance CLEAR  CLEAR    Specific Gravity, Urine 1.010  1.005 - 1.030    pH 6.0  5.0 - 8.0    Glucose, UA NEGATIVE  NEGATIVE (mg/dL)   Hgb urine dipstick LARGE (*) NEGATIVE    Bilirubin Urine NEGATIVE  NEGATIVE    Ketones, ur NEGATIVE  NEGATIVE (mg/dL)   Protein, ur NEGATIVE  NEGATIVE (mg/dL)   Urobilinogen, UA 0.2  0.0 - 1.0 (mg/dL)   Nitrite NEGATIVE  NEGATIVE    Leukocytes, UA NEGATIVE  NEGATIVE   URINE MICROSCOPIC-ADD ON     Status: Abnormal   Collection Time   08/27/11  8:10 AM      Component Value Range   Squamous Epithelial / LPF FEW (*) RARE    RBC / HPF 3-6  <3 (RBC/hpf)  HCG, QUANTITATIVE, PREGNANCY     Status: Normal   Collection Time   08/27/11  8:15 AM      Component Value Range   hCG, Beta Chain, Quant, S <1  <5 (mIU/mL)  CBC     Status: Abnormal     Collection Time   08/27/11  8:15 AM      Component Value Range   WBC 6.6  4.0 - 10.5 (K/uL)   RBC 4.32  3.87 - 5.11 (MIL/uL)   Hemoglobin 11.8 (*) 12.0 - 15.0 (g/dL)   HCT 16.1 (*) 09.6 - 46.0 (%)   MCV 82.6  78.0 - 100.0 (fL)   MCH 27.3  26.0 - 34.0 (pg)   MCHC 33.1  30.0 - 36.0 (g/dL)   RDW 04.5  40.9 - 81.1 (%)   Platelets 220  150 - 400 (K/uL)   IMAGING US Transvaginal Non-ob  08/27/2011  *RADIOLOGY REPORT*  Clinical Data: Rule out retained products of conception.  Bleeding for 1 month post Cytotec administration.  Beta HCG 07/31/2011:  19. Current beta HCG pending.  TRANSVAGINAL ULTRASOUND OF PELVIS  Technique:  Transvaginal ultrasound examination of the pelvis was performed including evaluation of the uterus, ovaries, adnexal regions, and pelvic cul-de-sac.  Comparison:  07/24/2011  Findings:  Uterus:  Demonstrates a sagittal length of 9.4 cm, depth of 4.6 cm and width of 5.1 cm.  A homogeneous myometrium is seen  Endometrium: Is homogeneously echogenic with a width of 9.4 mm. The previously noted irregular gestational sac is no longer present. No areas of focal thickening or heterogeneity are seen and no flow is identified with color Doppler evaluation to suggest retained products of conception sonographically.  Right ovary: Measures 2.8 x 1.7 x 1.9 cm and contains a dominant follicle  Left ovary: Measures 2.7 x 1.8 x 2.0 cm and has a normal appear appearance  Other Findings:  No pelvic fluid or separate adnexal masses are seen.  IMPRESSION: No evidence for retained products of conception sonographically.  Original Report Authenticated By: Bertha Stakes, M.D.   ASSESSMENT 1. Abnormal uterine bleeding   2. Hematuria   No evidence of retained POC  PLAN D/C home Follow-up Information    Follow up with [facility] on 09/16/2011. (or MAU as needed if symptoms worsen)    Contact information:   [address] [place] [address]         Medication  List  As of 08/27/2011 11:04 AM   START taking these medications         medroxyPROGESTERone 10 MG tablet   Commonly known as: PROVERA   Take 1 tablet (10 mg total) by mouth daily.         STOP taking these medications         misoprostol 200 MCG tablet          Where to get your medications    These are the prescriptions that you need to  pick up.   You may get these medications from any pharmacy.         medroxyPROGESTERone 10 MG tablet          Bleeding and infection precautions Urine culture and G/CT pending  Janet Decesare 08/27/2011 10:29 AM

## 2011-09-11 ENCOUNTER — Encounter: Payer: Self-pay | Admitting: Obstetrics & Gynecology

## 2011-09-17 ENCOUNTER — Encounter: Payer: Self-pay | Admitting: Family

## 2011-09-17 ENCOUNTER — Telehealth: Payer: Self-pay | Admitting: Family

## 2011-09-17 ENCOUNTER — Ambulatory Visit (INDEPENDENT_AMBULATORY_CARE_PROVIDER_SITE_OTHER): Payer: Self-pay | Admitting: Family

## 2011-09-17 VITALS — BP 110/69 | HR 77 | Temp 98.2°F | Ht 63.0 in | Wt 172.3 lb

## 2011-09-17 DIAGNOSIS — Z01419 Encounter for gynecological examination (general) (routine) without abnormal findings: Secondary | ICD-10-CM

## 2011-09-17 DIAGNOSIS — R300 Dysuria: Secondary | ICD-10-CM

## 2011-09-17 LAB — POCT URINALYSIS DIP (DEVICE)
Ketones, ur: NEGATIVE mg/dL
Leukocytes, UA: NEGATIVE
Nitrite: NEGATIVE

## 2011-09-17 LAB — POCT PREGNANCY, URINE: Preg Test, Ur: NEGATIVE

## 2011-09-17 MED ORDER — SULFAMETHOXAZOLE-TRIMETHOPRIM 800-160 MG PO TABS: 1.0000 | ORAL_TABLET | Freq: Two times a day (BID) | ORAL | Status: AC

## 2011-09-17 MED ORDER — FLUCONAZOLE 150 MG PO TABS: 150.0000 mg | ORAL_TABLET | Freq: Once | ORAL | Status: AC

## 2011-09-17 MED ORDER — SULFAMETHOXAZOLE-TRIMETHOPRIM 800-160 MG PO TABS: 1.0000 | ORAL_TABLET | Freq: Two times a day (BID) | ORAL | Status: DC

## 2011-09-17 NOTE — Progress Notes (Signed)
Subjective:     Brooke Lam is a 31 y.o. female and is here for a comprehensive physical exam. The patient presents for follow-up for completion of miscarriage, buring with urination, vaginal itching, and episodic burning chest pain. Pt was seen in MAU in May 2013, diagnosed with SAB and given provera for heavy bleeding. US showed no products of conception. On follow-up today she notes that she has had some burning pain in her chest while sleeping on her stomach at night. Patient denies diaphoresis, sweating, radiating pain, dyspnea, and SOB. She does report relief of burning CP when changing position to supine. Patient also notes a subjective h/o gastritis.    In addition pt reports dysuria x one week and positive vaginal itching.  No abnormal discharge or odor.  History   Social History  . Marital Status: Single    Spouse Name: N/A    Number of Children: N/A  . Years of Education: N/A   Occupational History  . Not on file.   Social History Main Topics  . Smoking status: Former Smoker    Types: Cigarettes  . Smokeless tobacco: Never Used   Comment: quit 2005  . Alcohol Use: No  . Drug Use: No  . Sexually Active: Yes    Birth Control/ Protection: None   Other Topics Concern  . Not on file   Social History Narrative  . No narrative on file   No health maintenance topics applied.  The following portions of the patient's history were reviewed and updated as appropriate: allergies, current medications, past family history, past medical history, past social history, past surgical history and problem list.  Review of Systems Constitutional: negative for chills, fevers and night sweats Eyes: negative Respiratory: negative Cardiovascular: negative Gastrointestinal: negative for abdominal pain Genitourinary:positive for burning with micturation, negative for hematuria Hematologic/lymphatic: negative Musculoskeletal:negative Neurological: negative Behavioral/Psych:  negative Endocrine: negative Allergic/Immunologic: negative   Objective:    BP 110/69  Pulse 77  Temp 98.2 F (36.8 C) (Oral)  Ht 5\' 3"  (1.6 m)  Wt 172 lb 4.8 oz (78.155 kg)  BMI 30.52 kg/m2  LMP 09/10/2011 General appearance: alert, cooperative and no distress Head: Normocephalic, without obvious abnormality, atraumatic Neck: no adenopathy, no carotid bruit, no JVD, supple, symmetrical, trachea midline and thyroid not enlarged, symmetric, no tenderness/mass/nodules Back: symmetric, no curvature. ROM normal. No CVA tenderness. Lungs: clear to auscultation bilaterally Heart: regular rate and rhythm, S1, S2 normal, no murmur, click, rub or gallop Abdomen: soft, non-tender; bowel sounds normal; no masses,  no organomegaly Pelvis:  No abnormal lesions or discharge; negative CMT; adnexa - no masses and nontender with palpation; uterus - mobile, non tender. Extremities: extremities normal, atraumatic, no cyanosis or edema Skin: Skin color, texture, turgor normal. No rashes or lesions  Urine pregnancy test: Negative  Results for orders placed in visit on 09/17/11 (from the past 24 hour(s))  POCT PREGNANCY, URINE     Status: Normal   Collection Time   09/17/11  1:25 PM      Component Value Range   Preg Test, Ur NEGATIVE  NEGATIVE  POCT URINALYSIS DIP (DEVICE)     Status: Abnormal   Collection Time   09/17/11  2:01 PM      Component Value Range   Glucose, UA NEGATIVE  NEGATIVE mg/dL   Bilirubin Urine NEGATIVE  NEGATIVE   Ketones, ur NEGATIVE  NEGATIVE mg/dL   Specific Gravity, Urine 1.010  1.005 - 1.030   Hgb urine dipstick SMALL (*) NEGATIVE  pH 6.0  5.0 - 8.0   Protein, ur NEGATIVE  NEGATIVE mg/dL   Urobilinogen, UA 0.2  0.0 - 1.0 mg/dL   Nitrite NEGATIVE  NEGATIVE   Leukocytes, UA NEGATIVE  NEGATIVE    Assessment:    Healthy female exam.  GERD Dysuria Vaginitis    Plan:  Pap smear to lab GC / Chlamydia pending Will call with pap and GC /Chlam results Referral to  [facility] for episodic GERD if no improvement or worsening of symptoms Rx for Bactrim DS and fluconazole 150 mg x1 Instructions given for famotidine OTC for GERD  [facility]

## 2011-10-19 ENCOUNTER — Ambulatory Visit: Payer: Self-pay | Admitting: Family

## 2011-10-21 ENCOUNTER — Telehealth: Payer: Self-pay | Admitting: *Deleted

## 2011-10-21 NOTE — Telephone Encounter (Signed)
Called pt w/Brooke Lam- interpreter and left message that all her results were negative. She should keep next clinic appt as scheduled on 11/12/11 @ 1315. She may call back if she has questions.

## 2011-10-21 NOTE — Telephone Encounter (Addendum)
Pt called and left message in Spanish. [name]- interpreter listened to message. Pt. stated that she would like her Pap results.

## 2011-11-12 ENCOUNTER — Ambulatory Visit: Payer: Self-pay | Admitting: Advanced Practice Midwife

## 2011-11-12 ENCOUNTER — Ambulatory Visit (INDEPENDENT_AMBULATORY_CARE_PROVIDER_SITE_OTHER): Payer: Self-pay | Admitting: *Deleted

## 2011-11-12 ENCOUNTER — Encounter: Payer: Self-pay | Admitting: Obstetrics and Gynecology

## 2011-11-12 VITALS — BP 107/71 | HR 73 | Temp 98.6°F | Wt 172.0 lb

## 2011-11-12 DIAGNOSIS — Z3200 Encounter for pregnancy test, result unknown: Secondary | ICD-10-CM

## 2011-11-12 NOTE — Progress Notes (Signed)
Pt in for pregnancy test today. Result positive. Pregnancy verification letter given to patient, advised to contact [facility] to begin prenatal care. Advised that if she has any problems she should go to MAU. Pt agrees. Interpreter present.

## 2011-11-14 ENCOUNTER — Encounter (HOSPITAL_COMMUNITY): Payer: Self-pay | Admitting: Obstetrics and Gynecology

## 2011-11-14 ENCOUNTER — Inpatient Hospital Stay (HOSPITAL_COMMUNITY): Payer: Self-pay

## 2011-11-14 ENCOUNTER — Inpatient Hospital Stay (HOSPITAL_COMMUNITY)
Admission: AD | Admit: 2011-11-14 | Discharge: 2011-11-14 | Disposition: A | Discharge status: Home / Self Care | Payer: Self-pay | Source: Ambulatory Visit | Attending: Obstetrics & Gynecology | Admitting: Obstetrics & Gynecology

## 2011-11-14 DIAGNOSIS — O26859 Spotting complicating pregnancy, unspecified trimester: Secondary | ICD-10-CM | POA: Insufficient documentation

## 2011-11-14 LAB — URINE MICROSCOPIC-ADD ON

## 2011-11-14 LAB — URINALYSIS, ROUTINE W REFLEX MICROSCOPIC
Leukocytes, UA: NEGATIVE
Protein, ur: NEGATIVE mg/dL
Specific Gravity, Urine: 1.025 (ref 1.005–1.030)
Urobilinogen, UA: 0.2 mg/dL (ref 0.0–1.0)

## 2011-11-14 LAB — CBC
MCHC: 33.4 g/dL (ref 30.0–36.0)
Platelets: 216 10*3/uL (ref 150–400)
RDW: 13.5 % (ref 11.5–15.5)
WBC: 6.6 10*3/uL (ref 4.0–10.5)

## 2011-11-14 LAB — HCG, QUANTITATIVE, PREGNANCY: hCG, Beta Chain, Quant, S: 4442 m[IU]/mL — ABNORMAL HIGH (ref <5)

## 2011-11-14 NOTE — MAU Note (Signed)
Abdominal pain since last night a few days ago passed 2 or 3 dark clots.

## 2011-11-14 NOTE — MAU Provider Note (Signed)
History     CSN: [id number]  Arrival date and time: 11/14/11 0701   First Provider Initiated Contact with Patient 11/14/11 [phone]      Chief Complaint  Patient presents with  . Abdominal Pain  . Vaginal Bleeding   HPI This is a 31 y.o. female at [redacted]w[redacted]d who presents with c/o brown spotting and some cramping. Just had a miscarriage in may and is worried about this baby. Denies fever or new bleeding.  OB History    Grav Para Term Preterm Abortions TAB SAB Ect Mult Living   6 3 3  0 2 0 1 1 0 3      Past Medical History  Diagnosis Date  . Ectopic pregnancy     Past Surgical History  Procedure Date  . Cesarian section   . Cesarean section     Family History  Problem Relation Age of Onset  . Anesthesia problems Neg Hx   . Hearing loss Neg Hx   . Diabetes Father     History  Substance Use Topics  . Smoking status: Former Smoker    Types: Cigarettes  . Smokeless tobacco: Never Used   Comment: quit 2005  . Alcohol Use: No    Allergies: No Known Allergies  Prescriptions prior to admission  Medication Sig Dispense Refill  . Prenatal Vit-Fe Fumarate-FA (PRENATAL MULTIVITAMIN) TABS Take 1 tablet by mouth every morning.        ROS See HPI  Physical Exam   Blood pressure 114/82, pulse 76, temperature 98.4 F (36.9 C), temperature source Oral, height 5\' 3"  (1.6 m), weight 172 lb 9.6 oz (78.291 kg), last menstrual period 10/08/2011.  Physical Exam  Constitutional: She is oriented to person, place, and time. She appears well-developed and well-nourished. No distress.  Cardiovascular: Normal rate.   Respiratory: Effort normal.  GI: Soft. She exhibits no distension and no mass. There is no tenderness. There is no rebound and no guarding.  Musculoskeletal: Normal range of motion.  Neurological: She is alert and oriented to person, place, and time.  Skin: Skin is warm and dry.  Psychiatric: She has a normal mood and affect.    MAU Course  Procedures   Assessment  and Plan  A:  Pregnancy at [redacted]w[redacted]d      R/O ectopic (has history of ectopic)  P:  Will check Quant HCG and probably follow with [place]       Report to [employer] CNM  [facility] 11/14/2011, 7:52 AM   Results for orders placed during the hospital encounter of 11/14/11 (from the past 24 hour(s))  URINALYSIS, ROUTINE W REFLEX MICROSCOPIC     Status: Abnormal   Collection Time   11/14/11  7:10 AM      Component Value Range   Color, Urine YELLOW  YELLOW   APPearance CLEAR  CLEAR   Specific Gravity, Urine 1.025  1.005 - 1.030   pH 6.0  5.0 - 8.0   Glucose, UA NEGATIVE  NEGATIVE mg/dL   Hgb urine dipstick MODERATE (*) NEGATIVE   Bilirubin Urine NEGATIVE  NEGATIVE   Ketones, ur NEGATIVE  NEGATIVE mg/dL   Protein, ur NEGATIVE  NEGATIVE mg/dL   Urobilinogen, UA 0.2  0.0 - 1.0 mg/dL   Nitrite NEGATIVE  NEGATIVE   Leukocytes, UA NEGATIVE  NEGATIVE  URINE MICROSCOPIC-ADD ON     Status: Abnormal   Collection Time   11/14/11  7:10 AM      Component Value Range   Squamous Epithelial / LPF  FEW (*) RARE   WBC, UA 0-2  <3 WBC/hpf   RBC / HPF 3-6  <3 RBC/hpf   Bacteria, UA FEW (*) RARE  HCG, QUANTITATIVE, PREGNANCY     Status: Abnormal   Collection Time   11/14/11  7:48 AM      Component Value Range   hCG, Beta Chain, Quant, S 4442 (*) <5 mIU/mL  CBC     Status: Abnormal   Collection Time   11/14/11  7:48 AM      Component Value Range   WBC 6.6  4.0 - 10.5 K/uL   RBC 4.21  3.87 - 5.11 MIL/uL   Hemoglobin 11.5 (*) 12.0 - 15.0 g/dL   HCT 16.1 (*) 09.6 - 04.5 %   MCV 81.7  78.0 - 100.0 fL   MCH 27.3  26.0 - 34.0 pg   MCHC 33.4  30.0 - 36.0 g/dL   RDW 40.9  81.1 - 91.4 %   Platelets 216  150 - 400 K/uL    US Ob Comp Less 14 Wks  11/14/2011  *RADIOLOGY REPORT*  Clinical Data: Spotting and pelvic pain.  OBSTETRIC <14 WK [place] AND TRANSVAGINAL OB US  Technique:  Both transabdominal and transvaginal ultrasound examinations were performed for complete evaluation of the gestation as well as the  maternal uterus, adnexal regions, and pelvic cul-de-sac.  Transvaginal technique was performed to assess early pregnancy.  Comparison:  08/27/2011.  Intrauterine gestational sac:  Visualized/normal in shape. Yolk sac: Present Embryo: None Cardiac Activity: N/A Heart Rate: N/A bpm  MSD: 0.78 mm  five w three d CRL:   mm   w   d             [place] EDC: 07/14/2012.  Maternal uterus/adnexae: No subchorionic hemorrhage. Normal right ovary. Normal left ovary. No free pelvic fluid.  IMPRESSION: Intrauterine gestational sac estimated at 5 weeks and 3 days gestation.  A yolk sac is present.  No embryo is identified.   Original Report Authenticated By: P. Loralie Champagne, M.D.    US Ob Transvaginal  11/14/2011  *RADIOLOGY REPORT*  Clinical Data: Spotting and pelvic pain.  OBSTETRIC <14 WK [place] AND TRANSVAGINAL OB US  Technique:  Both transabdominal and transvaginal ultrasound examinations were performed for complete evaluation of the gestation as well as the maternal uterus, adnexal regions, and pelvic cul-de-sac.  Transvaginal technique was performed to assess early pregnancy.  Comparison:  08/27/2011.  Intrauterine gestational sac:  Visualized/normal in shape. Yolk sac: Present Embryo: None Cardiac Activity: N/A Heart Rate: N/A bpm  MSD: 0.78 mm  five w three d CRL:   mm   w   d             [place] EDC: 07/14/2012.  Maternal uterus/adnexae: No subchorionic hemorrhage. Normal right ovary. Normal left ovary. No free pelvic fluid.  IMPRESSION: Intrauterine gestational sac estimated at 5 weeks and 3 days gestation.  A yolk sac is present.  No embryo is identified.   Original Report Authenticated By: P. Loralie Champagne, M.D.    A/P: 31 y.o. [id number] at [redacted]w[redacted]d Spotting - IUP verified by u/s Precautions rev'd, f/u for prenatal care as soon as possible

## 2011-11-14 NOTE — MAU Note (Signed)
Pt presents to MAU with chief complaint of vaginal spotting that pt describes as dark brown that she noticed 2 days ago, abdominal pain in lower abdomen.  Pt says she had intercourse last thurs and noticed the spotting following that. Pt is G6P3- all three deliveries were c-section

## 2011-11-14 NOTE — MAU Provider Note (Signed)
Chart reviewed and agree with management and plan.  

## 2011-11-18 ENCOUNTER — Ambulatory Visit (HOSPITAL_COMMUNITY): Payer: Self-pay

## 2011-11-18 ENCOUNTER — Encounter (HOSPITAL_COMMUNITY): Payer: Self-pay

## 2011-11-18 ENCOUNTER — Inpatient Hospital Stay (HOSPITAL_COMMUNITY)
Admission: AD | Admit: 2011-11-18 | Discharge: 2011-11-18 | Disposition: A | Discharge status: Home / Self Care | Payer: Self-pay | Source: Ambulatory Visit | Attending: Obstetrics & Gynecology | Admitting: Obstetrics & Gynecology

## 2011-11-18 ENCOUNTER — Inpatient Hospital Stay (HOSPITAL_COMMUNITY): Payer: Self-pay

## 2011-11-18 DIAGNOSIS — R109 Unspecified abdominal pain: Secondary | ICD-10-CM | POA: Insufficient documentation

## 2011-11-18 DIAGNOSIS — O209 Hemorrhage in early pregnancy, unspecified: Secondary | ICD-10-CM

## 2011-11-18 DIAGNOSIS — O4410 Placenta previa with hemorrhage, unspecified trimester: Secondary | ICD-10-CM | POA: Insufficient documentation

## 2011-11-18 LAB — WET PREP, GENITAL
Clue Cells Wet Prep HPF POC: NONE SEEN
Yeast Wet Prep HPF POC: NONE SEEN

## 2011-11-18 LAB — URINALYSIS, ROUTINE W REFLEX MICROSCOPIC
Bilirubin Urine: NEGATIVE
Glucose, UA: NEGATIVE mg/dL
Ketones, ur: NEGATIVE mg/dL
Protein, ur: NEGATIVE mg/dL

## 2011-11-18 LAB — URINE MICROSCOPIC-ADD ON

## 2011-11-18 NOTE — Progress Notes (Signed)
[facility] Department of Clinical Social Work Documentation of Interpretation   I assisted __Sherryl RN Terri  NP_________________ with interpretation of ___questions and plan of care___________________ for this patient.

## 2011-11-18 NOTE — MAU Note (Addendum)
Pain in lower abd, was really bad last night. Pain also when walking. Has had pain for past 7 days.  Has been seeing ' brown water' when pees.

## 2011-11-18 NOTE — MAU Provider Note (Signed)
Attestation of Attending Supervision of Advanced Practitioner (CNM/NP): Evaluation and management procedures were performed by the Advanced Practitioner under my supervision and collaboration.  I have reviewed the Advanced Practitioner's note and chart, and I agree with the management and plan.  Orren Pietsch, MD, FACOG Attending Obstetrician & Gynecologist Faculty Practice, [facility]  

## 2011-11-18 NOTE — Progress Notes (Signed)
[facility] Department of Clinical Social Work Documentation of Interpretation   I assisted _Cherryl RN Terri NP__________________ with interpretation of __discharge____________________ for this patient.

## 2011-11-18 NOTE — MAU Provider Note (Signed)
History     CSN: [id number]  Arrival date and time: 11/18/11 4132   First Provider Initiated Contact with Patient 11/18/11 0801      Chief Complaint  Patient presents with  . Abdominal Pain   HPI Brooke Lam 31 y.o. [redacted]w[redacted]d Comes to MAU with dark brown vaginal bleeding and lower midline abdominal pain.  Has had a miscarriage previously and is worried.  Was seen in MAU on 11-14-11 and ultrasound showed IUGS with yolk sac.  Interpreter present for discussion and exam.  Blood type B positive.  OB History    Grav Para Term Preterm Abortions TAB SAB Ect Mult Living   6 3 3  0 2 0 1 1 0 3      Past Medical History  Diagnosis Date  . Ectopic pregnancy     Past Surgical History  Procedure Date  . Cesarian section   . Cesarean section     Family History  Problem Relation Age of Onset  . Anesthesia problems Neg Hx   . Hearing loss Neg Hx   . Diabetes Father     History  Substance Use Topics  . Smoking status: Former Smoker    Types: Cigarettes  . Smokeless tobacco: Never Used   Comment: quit 2005  . Alcohol Use: No    Allergies: No Known Allergies  Prescriptions prior to admission  Medication Sig Dispense Refill  . Prenatal Vit-Fe Fumarate-FA (PRENATAL MULTIVITAMIN) TABS Take 1 tablet by mouth every morning.        Review of Systems  Constitutional: Negative for fever.  Gastrointestinal: Positive for abdominal pain. Negative for nausea, vomiting, diarrhea and constipation.  Genitourinary: Negative for dysuria.       Vaginal bleeding   Physical Exam   Blood pressure 112/62, pulse 81, temperature 98.9 F (37.2 C), temperature source Oral, resp. rate 18, height 5\' 2"  (1.575 m), weight 173 lb (78.472 kg), last menstrual period 10/08/2011.  Physical Exam  Nursing note and vitals reviewed. Constitutional: She is oriented to person, place, and time. She appears well-developed and well-nourished.  HENT:  Head: Normocephalic.  Eyes: EOM are normal.  Neck: Neck  supple.  GI: Soft. There is tenderness. There is no rebound and no guarding.       Tender all across the lower abdomen.    Genitourinary:       Speculum exam: Vulva - negative Vagina - Mod amount of brown discharge,  Cervix - very small amount of red mucus coming from os Bimanual exam: Cervix closed Uterus tender, normal size Adnexa non tender, no masses bilaterally GC/Chlam, wet prep done Chaperone present for exam.  Musculoskeletal: Normal range of motion.  Neurological: She is alert and oriented to person, place, and time.  Skin: Skin is warm and dry.  Psychiatric: She has a normal mood and affect.    MAU Course  Procedures Results for orders placed during the hospital encounter of 11/18/11 (from the past 24 hour(s))  URINALYSIS, ROUTINE W REFLEX MICROSCOPIC     Status: Abnormal   Collection Time   11/18/11  7:45 AM      Component Value Range   Color, Urine YELLOW  YELLOW   APPearance CLEAR  CLEAR   Specific Gravity, Urine <1.005 (*) 1.005 - 1.030   pH 6.5  5.0 - 8.0   Glucose, UA NEGATIVE  NEGATIVE mg/dL   Hgb urine dipstick LARGE (*) NEGATIVE   Bilirubin Urine NEGATIVE  NEGATIVE   Ketones, ur NEGATIVE  NEGATIVE mg/dL  Protein, ur NEGATIVE  NEGATIVE mg/dL   Urobilinogen, UA 0.2  0.0 - 1.0 mg/dL   Nitrite NEGATIVE  NEGATIVE   Leukocytes, UA NEGATIVE  NEGATIVE  URINE MICROSCOPIC-ADD ON     Status: Abnormal   Collection Time   11/18/11  7:45 AM      Component Value Range   Squamous Epithelial / LPF FEW (*) RARE   WBC, UA 0-2  <3 WBC/hpf   RBC / HPF 0-2  <3 RBC/hpf   Bacteria, UA RARE  RARE  WET PREP, GENITAL     Status: Abnormal   Collection Time   11/18/11  7:55 AM      Component Value Range   Yeast Wet Prep HPF POC NONE SEEN  NONE SEEN   Trich, Wet Prep NONE SEEN  NONE SEEN   Clue Cells Wet Prep HPF POC NONE SEEN  NONE SEEN   WBC, Wet Prep HPF POC FEW (*) NONE SEEN   MDM   Assessment and Plan  Bleeding in pregnancy Low lying placenta Appropriate  progression of pregnancy  Plan Pelvic rest Begin prenatal care as soon as possible Return if you have severe, red bleeding or severe pain.   BURLESON,TERRI 11/18/2011, 8:27 AM

## 2011-11-18 NOTE — MAU Note (Signed)
Patient reports having vaginal itching for several days with some sharp pain last night 7/10, morning sickness not vomiting or diarrhea, no painful urination, no constipation.

## 2011-11-19 LAB — GC/CHLAMYDIA PROBE AMP, GENITAL: Chlamydia, DNA Probe: NEGATIVE

## 2011-11-24 ENCOUNTER — Inpatient Hospital Stay (HOSPITAL_COMMUNITY): Payer: Self-pay

## 2011-11-24 ENCOUNTER — Inpatient Hospital Stay (HOSPITAL_COMMUNITY)
Admission: AD | Admit: 2011-11-24 | Discharge: 2011-11-24 | Disposition: A | Discharge status: Home / Self Care | Payer: Self-pay | Source: Ambulatory Visit | Attending: Obstetrics & Gynecology | Admitting: Obstetrics & Gynecology

## 2011-11-24 ENCOUNTER — Encounter (HOSPITAL_COMMUNITY): Payer: Self-pay | Admitting: *Deleted

## 2011-11-24 DIAGNOSIS — O209 Hemorrhage in early pregnancy, unspecified: Secondary | ICD-10-CM | POA: Insufficient documentation

## 2011-11-24 MED ORDER — OXYCODONE-ACETAMINOPHEN 5-325 MG PO TABS
2.0000 | ORAL_TABLET | ORAL | Status: DC
  Filled 2011-11-24: qty 2

## 2011-11-24 NOTE — MAU Provider Note (Signed)
Attestation of Attending Supervision of Advanced Practitioner (CNM/NP): Evaluation and management procedures were performed by the Advanced Practitioner under my supervision and collaboration.  I have reviewed the Advanced Practitioner's note and chart, and I agree with the management and plan.  HARRAWAY-SMITH, Jacora Hopkins 9:47 PM     

## 2011-11-24 NOTE — MAU Provider Note (Signed)
Chief Complaint: Vaginal Bleeding   First Provider Initiated Contact with Patient 11/24/11 1758     SUBJECTIVE HPI: Brooke Lam is a 31 y.o. [id number] at [redacted]w[redacted]d by LMP who presents to maternity admissions reporting a single gush of bright red bleeding followed by leakage of pink fluid occuring 1 hour ago.  This is a desired pregnancy and pt is worried she is having a miscarriage.  She denies vaginal itching/burning, urinary symptoms, h/a, dizziness, n/v, or fever/chills.     Past Medical History  Diagnosis Date  . Ectopic pregnancy    Past Surgical History  Procedure Date  . Cesarian section   . Cesarean section    History   Social History  . Marital Status: Single    Spouse Name: N/A    Number of Children: N/A  . Years of Education: N/A   Occupational History  . Not on file.   Social History Main Topics  . Smoking status: Former Smoker    Types: Cigarettes  . Smokeless tobacco: Never Used   Comment: quit 2005  . Alcohol Use: No  . Drug Use: No  . Sexually Active: Yes    Birth Control/ Protection: None   Other Topics Concern  . Not on file   Social History Narrative  . No narrative on file   No current facility-administered medications on file prior to encounter.   Current Outpatient Prescriptions on File Prior to Encounter  Medication Sig Dispense Refill  . Prenatal Vit-Fe Fumarate-FA (PRENATAL MULTIVITAMIN) TABS Take 1 tablet by mouth every morning.       No Known Allergies  ROS: Pertinent items in HPI  OBJECTIVE Blood pressure 113/56, pulse 78, temperature 98.6 F (37 C), temperature source Oral, resp. rate 22, height 5\' 2"  (1.575 m), weight 78.983 kg (174 lb 2 oz), last menstrual period 10/08/2011. GENERAL: Well-developed, well-nourished female in no acute distress.  HEENT: Normocephalic HEART: normal rate RESP: normal effort ABDOMEN: Soft, non-tender EXTREMITIES: Nontender, no edema NEURO: Alert and oriented SPECULUM EXAM: Deferred--done  11/18/11  Blood type B positive   IMAGING   US Ob Transvaginal  11/24/2011  *RADIOLOGY REPORT*  Clinical Data: Vaginal bleeding.  Follow-up prior obstetrical ultrasound.  TRANSVAGINAL OB ULTRASOUND  Technique:  Transvaginal ultrasound was performed for evaluation of the gestation as well as the maternal uterus and adnexal regions.  Comparison: 11/18/2011.  Findings:  Intrauterine gestational sac: Visualized/normal in shape.  The gestational sac is again noted within the lower uterine segment adjacent to the c-section incision. Yolk sac: Visualized. Embryo: Visualized. Cardiac Activity: Visualized. Heart Rate: 122 bpm  CRL:  6.8 mm  6 weeks 4 days  [place] EDC: 07/15/2012  Maternal uterus/Adnexae: Possible small subchorionic hematoma.  No adnexal mass or free pelvic fluid identified.  IMPRESSION:  1.  Single live intrauterine gestation with best estimated gestational age of [redacted] weeks 4 days based on crown-rump length. 2.  The gestational sac remains inferiorly positioned near the C- section incision. 3.  Possible small subchorionic hematoma.  This examination was performed after hours.  Continued ultrasound followup is recommended to better assess pregnancy location.   Original Report Authenticated By: Gerrianne Scale, M.D.     ASSESSMENT 1. Bleeding in early pregnancy     PLAN Discharge home with bleeding precautions F/U with early prenatal care  Return to MAU as needed  Medication List  As of 11/24/2011  6:00 PM   ASK your doctor about these medications         prenatal  multivitamin Tabs   Take 1 tablet by mouth every morning.             Sharen Counter Certified Nurse-Midwife 11/24/2011  6:00 PM

## 2011-12-01 ENCOUNTER — Encounter (HOSPITAL_COMMUNITY): Payer: Self-pay

## 2011-12-01 ENCOUNTER — Inpatient Hospital Stay (HOSPITAL_COMMUNITY)
Admission: AD | Admit: 2011-12-01 | Discharge: 2011-12-01 | Disposition: A | Discharge status: Home / Self Care | Payer: Self-pay | Source: Ambulatory Visit | Attending: Obstetrics & Gynecology | Admitting: Obstetrics & Gynecology

## 2011-12-01 DIAGNOSIS — R109 Unspecified abdominal pain: Secondary | ICD-10-CM | POA: Insufficient documentation

## 2011-12-01 DIAGNOSIS — O200 Threatened abortion: Secondary | ICD-10-CM

## 2011-12-01 DIAGNOSIS — O209 Hemorrhage in early pregnancy, unspecified: Secondary | ICD-10-CM | POA: Insufficient documentation

## 2011-12-01 LAB — URINE MICROSCOPIC-ADD ON

## 2011-12-01 LAB — URINALYSIS, ROUTINE W REFLEX MICROSCOPIC
Bilirubin Urine: NEGATIVE
Nitrite: NEGATIVE
Specific Gravity, Urine: 1.01 (ref 1.005–1.030)
pH: 6 (ref 5.0–8.0)

## 2011-12-01 NOTE — MAU Provider Note (Signed)
Brooke Lam is a 31 y.o. female @ [redacted]w[redacted]d gestation who presents to MAU with vaginal bleeding. She was evaluated here 8/24, 8/28 and 9/3 for same. She had formal ultrasound on each visit that showed a viable IUP with [place]. Today she reports she continues to have some bleeding and cramping and wants to be sure her baby is ok.   BP 128/85  Pulse 74  Temp 98.3 F (36.8 C) (Oral)  Resp 16  Ht 5\' 2"  (1.575 m)  Wt 174 lb (78.926 kg)  BMI 31.83 kg/m2  LMP 10/08/2011   I did an informal bedside ultrasound and patient was able to visualize the pregnancy and heart beat.   Engineer, structural used.   Assessment: viable IUP  Plan: Discussed in detail with the patient threatened AB and [facility]. Patient voices understanding.   Patient plans to start care with Dr. Gaynell Face but has not had first appointment. Medication List  As of 12/01/2011  8:35 PM   CONTINUE taking these medications         prenatal multivitamin Tabs          I have reviewed this patient's vital signs, nurses notes, appropriate labs and imaging. I have answered all questions using the Spanish translator. Patient voices understanding.  Follow-up Information    Follow up with Kathreen Cosier, MD.   Contact information:   [address] [place] [address] [phone]

## 2011-12-01 NOTE — MAU Note (Signed)
Pt states via [employer] interpreter that she has hx of miscarriage at 6-7 weeks 4 months ago. Pt noted bleeding that began September 3rd, like a menstrual cycle. Here today for pain noted in lower abd on r side only. Was unable to move, pain only felt better when pt balled up. Upper thigh and low back pain. Changing pads x2 daily. No clots noted,, does note what appears to be "pieces of little red dirt".

## 2011-12-08 ENCOUNTER — Ambulatory Visit (HOSPITAL_COMMUNITY)
Admission: RE | Admit: 2011-12-08 | Discharge: 2011-12-08 | Disposition: A | Discharge status: Home / Self Care | Payer: Self-pay | Source: Ambulatory Visit | Attending: Obstetrics and Gynecology | Admitting: Obstetrics and Gynecology

## 2011-12-08 ENCOUNTER — Other Ambulatory Visit: Payer: Self-pay | Admitting: Obstetrics and Gynecology

## 2011-12-08 ENCOUNTER — Encounter (HOSPITAL_COMMUNITY): Payer: Self-pay

## 2011-12-08 DIAGNOSIS — N6314 Unspecified lump in the right breast, lower inner quadrant: Secondary | ICD-10-CM

## 2011-12-08 DIAGNOSIS — Z1239 Encounter for other screening for malignant neoplasm of breast: Secondary | ICD-10-CM

## 2011-12-08 DIAGNOSIS — N63 Unspecified lump in unspecified breast: Secondary | ICD-10-CM

## 2011-12-09 ENCOUNTER — Other Ambulatory Visit: Payer: Self-pay | Admitting: Obstetrics and Gynecology

## 2011-12-09 ENCOUNTER — Ambulatory Visit
Admission: RE | Admit: 2011-12-09 | Discharge: 2011-12-09 | Disposition: A | Discharge status: Home / Self Care | Payer: No Typology Code available for payment source | Source: Ambulatory Visit | Attending: Obstetrics and Gynecology | Admitting: Obstetrics and Gynecology

## 2011-12-09 ENCOUNTER — Other Ambulatory Visit: Payer: Self-pay

## 2011-12-09 DIAGNOSIS — N63 Unspecified lump in unspecified breast: Secondary | ICD-10-CM

## 2011-12-11 ENCOUNTER — Inpatient Hospital Stay (HOSPITAL_COMMUNITY): Payer: Self-pay

## 2011-12-11 ENCOUNTER — Inpatient Hospital Stay (HOSPITAL_COMMUNITY)
Admission: AD | Admit: 2011-12-11 | Discharge: 2011-12-11 | Disposition: A | Discharge status: Home / Self Care | Payer: Self-pay | Source: Ambulatory Visit | Attending: Obstetrics and Gynecology | Admitting: Obstetrics and Gynecology

## 2011-12-11 DIAGNOSIS — B3731 Acute candidiasis of vulva and vagina: Secondary | ICD-10-CM

## 2011-12-11 DIAGNOSIS — O468X9 Other antepartum hemorrhage, unspecified trimester: Secondary | ICD-10-CM

## 2011-12-11 DIAGNOSIS — O209 Hemorrhage in early pregnancy, unspecified: Secondary | ICD-10-CM | POA: Insufficient documentation

## 2011-12-11 DIAGNOSIS — B373 Candidiasis of vulva and vagina: Secondary | ICD-10-CM

## 2011-12-11 DIAGNOSIS — O418X90 Other specified disorders of amniotic fluid and membranes, unspecified trimester, not applicable or unspecified: Secondary | ICD-10-CM

## 2011-12-11 DIAGNOSIS — R109 Unspecified abdominal pain: Secondary | ICD-10-CM | POA: Insufficient documentation

## 2011-12-11 DIAGNOSIS — N949 Unspecified condition associated with female genital organs and menstrual cycle: Secondary | ICD-10-CM | POA: Insufficient documentation

## 2011-12-11 MED ORDER — NYSTATIN 100000 UNIT/GM EX CREA: TOPICAL_CREAM | CUTANEOUS | Status: DC

## 2011-12-11 MED ORDER — TRIAMCINOLONE ACETONIDE 0.1 % EX CREA: TOPICAL_CREAM | Freq: Two times a day (BID) | CUTANEOUS | Status: DC

## 2011-12-11 NOTE — Progress Notes (Signed)
[facility] Department of Clinical Social Work Documentation of Interpretation   I assisted ___Jolynn RN________________ with interpretation of ______triage________________ for this patient.

## 2011-12-11 NOTE — MAU Note (Signed)
abd pain- started at 2200 and coffee colored discharge.  Passed clots at 0300.  Still passing bright red clots.

## 2011-12-11 NOTE — MAU Provider Note (Signed)
  History     CSN: [id number]  Arrival date and time: 12/11/11 4098   First Provider Initiated Contact with Patient 12/11/11 [phone]      Chief Complaint  Patient presents with  . Abdominal Pain  . Vaginal Bleeding   HPI Pt is Brooke Lam with vaginal bleeding with her pregnancy.  She was evaluated on 8/24, 8/28 and 9/3 with ultrasound- last ultrasound showed [redacted]w[redacted]d IUP with sac positioned near C-Section incision and recommended to have f/u ultrasound. There was a possible SCH.  Pt has continued to bleed and has passed a few small clots. Pt also complains of perianal itching- pt using Monistat without relief- sx just external- bleeding/pad makes it worse.  Past Medical History  Diagnosis Date  . Ectopic pregnancy     Past Surgical History  Procedure Date  . Cesarian section   . Cesarean section     Family History  Problem Relation Age of Onset  . Anesthesia problems Neg Hx   . Hearing loss Neg Hx   . Other Neg Hx   . Diabetes Father     History  Substance Use Topics  . Smoking status: Former Smoker    Types: Cigarettes  . Smokeless tobacco: Never Used   Comment: quit 2005  . Alcohol Use: No    Allergies: No Known Allergies  Prescriptions prior to admission  Medication Sig Dispense Refill  . Prenatal Vit-Fe Fumarate-FA (PRENATAL MULTIVITAMIN) TABS Take 1 tablet by mouth every morning.        ROS Physical Exam   Blood pressure 119/77, pulse 73, temperature 98.2 F (36.8 C), temperature source Oral, resp. rate 20, height 5' 1.5" (1.562 m), weight 77.565 kg (171 lb), last menstrual period 10/08/2011.  Physical Exam  Vitals reviewed. Constitutional: She appears well-developed and well-nourished.  HENT:  Head: Normocephalic.  Eyes: Pupils are equal, round, and reactive to light.  Neck: Normal range of motion. Neck supple.  Respiratory: Effort normal.  GI: Soft.  Genitourinary:       Cervix closed; sm amount of dark brown blood from os; no noted fissures or  excoriation or erythema  On exam  Musculoskeletal: Normal range of motion.  Neurological: She is alert.  Skin: Skin is warm and dry.  Psychiatric: She has a normal mood and affect.    MAU Course  Procedures   Interpreter used to explain [facility] and discharge instructions Assessment and Plan  Vaginal bleeding in pregnancy- pelvic rest Vaginal irritation- Triamcinolone cream + Nystatin cream (mix both prescriptions $4/each) apply externally BID prn irritation F/u with Dr. Gaynell Face as planned  [facility] 12/11/2011, 8:51 AM

## 2011-12-14 NOTE — MAU Provider Note (Signed)
Attestation of Attending Supervision of Advanced Practitioner (CNM/NP): Evaluation and management procedures were performed by the Advanced Practitioner under my supervision and collaboration.  I have reviewed the Advanced Practitioner's note and chart, and I agree with the management and plan.  Berdina Cheever 12/14/2011 8:59 AM   

## 2011-12-16 ENCOUNTER — Other Ambulatory Visit: Payer: Self-pay | Admitting: Obstetrics and Gynecology

## 2011-12-16 ENCOUNTER — Ambulatory Visit
Admission: RE | Admit: 2011-12-16 | Discharge: 2011-12-16 | Disposition: A | Discharge status: Home / Self Care | Payer: No Typology Code available for payment source | Source: Ambulatory Visit | Attending: Obstetrics and Gynecology | Admitting: Obstetrics and Gynecology

## 2011-12-16 DIAGNOSIS — N63 Unspecified lump in unspecified breast: Secondary | ICD-10-CM

## 2012-01-11 NOTE — Patient Instructions (Signed)
See detailed notes scanned under media. 

## 2012-01-11 NOTE — Progress Notes (Signed)
See detailed notes scanned under media. 

## 2012-01-21 ENCOUNTER — Inpatient Hospital Stay (HOSPITAL_COMMUNITY): Payer: Medicaid Other

## 2012-01-21 ENCOUNTER — Inpatient Hospital Stay (HOSPITAL_COMMUNITY)
Admission: AD | Admit: 2012-01-21 | Discharge: 2012-01-24 | DRG: 782 | Disposition: A | Discharge status: Home / Self Care | Payer: Medicaid Other | Source: Ambulatory Visit | Attending: Obstetrics | Admitting: Obstetrics

## 2012-01-21 DIAGNOSIS — O4690 Antepartum hemorrhage, unspecified, unspecified trimester: Secondary | ICD-10-CM

## 2012-01-21 DIAGNOSIS — O43219 Placenta accreta, unspecified trimester: Secondary | ICD-10-CM

## 2012-01-21 DIAGNOSIS — O4410 Placenta previa with hemorrhage, unspecified trimester: Principal | ICD-10-CM | POA: Diagnosis present

## 2012-01-21 DIAGNOSIS — R300 Dysuria: Secondary | ICD-10-CM

## 2012-01-21 DIAGNOSIS — Z3689 Encounter for other specified antenatal screening: Secondary | ICD-10-CM

## 2012-01-21 LAB — PROTIME-INR
INR: 1.02 (ref 0.00–1.49)
Prothrombin Time: 13.3 seconds (ref 11.6–15.2)

## 2012-01-21 LAB — CBC
MCH: 27.9 pg (ref 26.0–34.0)
MCHC: 34.4 g/dL (ref 30.0–36.0)
Platelets: 174 10*3/uL (ref 150–400)
RBC: 3.87 MIL/uL (ref 3.87–5.11)
RDW: 13.6 % (ref 11.5–15.5)

## 2012-01-21 LAB — URINALYSIS, MICROSCOPIC ONLY
Bilirubin Urine: NEGATIVE
Ketones, ur: >80 mg/dL — AB
Nitrite: NEGATIVE
Protein, ur: NEGATIVE mg/dL
Urobilinogen, UA: 0.2 mg/dL (ref 0.0–1.0)
pH: 5 (ref 5.0–8.0)

## 2012-01-21 LAB — FIBRINOGEN: Fibrinogen: 425 mg/dL (ref 204–475)

## 2012-01-21 MED ORDER — ACETAMINOPHEN 325 MG PO TABS
650.0000 mg | ORAL_TABLET | ORAL | Status: DC | PRN
  Administered 2012-01-23: 650 mg via ORAL
  Filled 2012-01-21: qty 2

## 2012-01-21 MED ORDER — INFLUENZA VIRUS VACC SPLIT PF IM SUSP
0.5000 mL | INTRAMUSCULAR | Status: AC
  Administered 2012-01-22: 0.5 mL via INTRAMUSCULAR
  Filled 2012-01-21: qty 0.5

## 2012-01-21 MED ORDER — SODIUM CHLORIDE 0.9 % IV SOLN
INTRAVENOUS | Status: DC
  Administered 2012-01-21 – 2012-01-22 (×3): via INTRAVENOUS

## 2012-01-21 MED ORDER — DOCUSATE SODIUM 100 MG PO CAPS
100.0000 mg | ORAL_CAPSULE | Freq: Two times a day (BID) | ORAL | Status: DC
  Administered 2012-01-21 – 2012-01-24 (×6): 100 mg via ORAL
  Filled 2012-01-21 (×6): qty 1

## 2012-01-21 MED ORDER — PRENATAL MULTIVITAMIN CH
1.0000 | ORAL_TABLET | Freq: Every day | ORAL | Status: DC
  Administered 2012-01-22 – 2012-01-24 (×3): 1 via ORAL
  Filled 2012-01-21 (×3): qty 1

## 2012-01-21 MED ORDER — OXYCODONE-ACETAMINOPHEN 5-325 MG PO TABS: 1.0000 | ORAL_TABLET | ORAL | Status: DC | PRN

## 2012-01-21 MED ORDER — ALUM & MAG HYDROXIDE-SIMETH 200-200-20 MG/5ML PO SUSP
30.0000 mL | ORAL | Status: DC | PRN
  Filled 2012-01-21: qty 30

## 2012-01-21 MED ORDER — ONDANSETRON HCL 4 MG PO TABS: 4.0000 mg | ORAL_TABLET | Freq: Four times a day (QID) | ORAL | Status: DC | PRN

## 2012-01-21 MED ORDER — ONDANSETRON HCL 4 MG/2ML IJ SOLN: 4.0000 mg | Freq: Four times a day (QID) | INTRAMUSCULAR | Status: DC | PRN

## 2012-01-21 MED ORDER — ZOLPIDEM TARTRATE 5 MG PO TABS: 5.0000 mg | ORAL_TABLET | Freq: Every evening | ORAL | Status: DC | PRN

## 2012-01-21 NOTE — MAU Note (Signed)
Started bleeding about , underwear completely saturated, pants blood half way to thighs.  States is cramping, started after bleeding., hx of bleeding a couple months ago.

## 2012-01-21 NOTE — MAU Provider Note (Signed)
History     CSN: [id number]  Arrival date and time: 01/21/12 1659   None     Chief Complaint  Patient presents with  . Vaginal Bleeding   HPI Brooke Lam is a 31 y.o. female @ [redacted]w[redacted]d gestation who presents to MAU with vaginal bleeding. The bleeding started approximately 30 minutes prior to arrival to MAU. The bleeding was bright red and heavier than a period with clots. Associated symptoms include abdominal cramping. Last sexual intercourse one month ago.  The history was provided by the patient.  OB History    Grav Para Term Preterm Abortions TAB SAB Ect Mult Living   6 3 3  0 2 0 1 1 0 3      Past Medical History  Diagnosis Date  . Ectopic pregnancy     Past Surgical History  Procedure Date  . Cesarian section   . Cesarean section     Family History  Problem Relation Age of Onset  . Anesthesia problems Neg Hx   . Hearing loss Neg Hx   . Other Neg Hx   . Diabetes Father     History  Substance Use Topics  . Smoking status: Former Smoker    Types: Cigarettes  . Smokeless tobacco: Never Used   Comment: quit 2005  . Alcohol Use: No    Allergies: No Known Allergies  Prescriptions prior to admission  Medication Sig Dispense Refill  . nystatin cream (MYCOSTATIN) Apply to affected area 2 times daily  30 g  0  . Prenatal Vit-Fe Fumarate-FA (PRENATAL MULTIVITAMIN) TABS Take 1 tablet by mouth every morning.      . triamcinolone cream (KENALOG) 0.1 % Apply topically 2 (two) times daily.  30 g  0    Review of Systems  Constitutional: Negative for fever, chills and weight loss.  HENT: Negative for ear pain, nosebleeds, congestion, sore throat and neck pain.   Eyes: Negative for blurred vision, double vision, photophobia and pain.  Respiratory: Negative for cough, shortness of breath and wheezing.   Cardiovascular: Negative for chest pain, palpitations and leg swelling.  Gastrointestinal: Positive for nausea and abdominal pain. Negative for heartburn, vomiting,  diarrhea and constipation.  Genitourinary: Negative for dysuria, urgency and frequency.  Musculoskeletal: Negative for myalgias and back pain.  Skin: Negative for itching and rash.  Neurological: Positive for headaches. Negative for dizziness, sensory change, speech change, seizures and weakness.  Endo/Heme/Allergies: Does not bruise/bleed easily.  Psychiatric/Behavioral: Negative for depression. The patient is not nervous/anxious.    Physical Exam   Blood pressure 132/84, pulse 105, temperature 99 F (37.2 C), temperature source Oral, resp. rate 20, last menstrual period 10/08/2011.  Physical Exam  Nursing note and vitals reviewed. Constitutional: She is oriented to person, place, and time. She appears well-developed and well-nourished. No distress.  HENT:  Head: Normocephalic and atraumatic.  Eyes: EOM are normal.  Neck: Neck supple.  Cardiovascular: Normal rate.   Respiratory: Effort normal.  GI: Soft. There is no tenderness.       Positive FHT's.  Genitourinary:       External genitalia without lesions. Large blood clots vaginal vault. Actively bleeding. Cervix inflamed, closed, no CMT, uterus approximately 16 week size.  Musculoskeletal: Normal range of motion.  Neurological: She is alert and oriented to person, place, and time.  Skin: Skin is warm and dry.  Psychiatric: She has a normal mood and affect. Her behavior is normal. Judgment and thought content normal.   Results for orders placed during  the hospital encounter of 01/21/12 (from the past 24 hour(s))  CBC     Status: Abnormal   Collection Time   01/21/12  5:35 PM      Component Value Range   WBC 9.2  4.0 - 10.5 K/uL   RBC 3.87  3.87 - 5.11 MIL/uL   Hemoglobin 10.8 (*) 12.0 - 15.0 g/dL   HCT 21.3 (*) 08.6 - 57.8 %   MCV 81.1  78.0 - 100.0 fL   MCH 27.9  26.0 - 34.0 pg   MCHC 34.4  30.0 - 36.0 g/dL   RDW 46.9  62.9 - 52.8 %   Platelets 174  150 - 400 K/uL    MAU Course  Procedures    Assessment: 31 y.o.  female @ [redacted]w[redacted]d with vaginal bleeding  Plan:  Dr. Clearance Coots to admit patient for observation.  Gevorg Brum, RN, FNP, [facility] 01/21/2012, 5:13 PM

## 2012-01-21 NOTE — Progress Notes (Signed)
2 large blood clots removed. Cervix clossed but appears swollen.

## 2012-01-22 ENCOUNTER — Encounter (HOSPITAL_COMMUNITY): Payer: Self-pay | Admitting: *Deleted

## 2012-01-22 ENCOUNTER — Inpatient Hospital Stay (HOSPITAL_COMMUNITY): Payer: Medicaid Other

## 2012-01-22 LAB — CBC
Platelets: 173 10*3/uL (ref 150–400)
RBC: 3.39 MIL/uL — ABNORMAL LOW (ref 3.87–5.11)
RDW: 13.7 % (ref 11.5–15.5)
WBC: 8.8 10*3/uL (ref 4.0–10.5)

## 2012-01-22 NOTE — H&P (Signed)
Brooke Lam is a 31 y.o. female presenting for vaginal bleeding. Maternal Medical History:  Reason for admission: Reason for admission: vaginal bleeding.  31yo G3 P3023.  EDC 07-14-12.  Presents with vaginal bleeding.  Prenatal complications: no prenatal complications   OB History    Grav Para Term Preterm Abortions TAB SAB Ect Mult Living   6 3 3  0 2 0 1 1 0 3     Past Medical History  Diagnosis Date  . Ectopic pregnancy    Past Surgical History  Procedure Date  . Cesarian section   . Cesarean section    Family History: family history includes Diabetes in her father.  There is no history of Anesthesia problems, and Hearing loss, and Other, . Social History:  reports that she has quit smoking. Her smoking use included Cigarettes. She has never used smokeless tobacco. She reports that she does not drink alcohol or use illicit drugs.   Prenatal Transfer Tool  Screening not done yet.  Review of Systems  All other systems reviewed and are negative.      Blood pressure 100/58, pulse 97, temperature 98.2 F (36.8 C), temperature source Oral, resp. rate 18, height 5\' 2"  (1.575 m), weight 81.194 kg (179 lb), last menstrual period 10/08/2011, SpO2 100.00%. Maternal Exam:  Abdomen: Patient reports no abdominal tenderness.   Physical Exam  Nursing note and vitals reviewed. Constitutional: She is oriented to person, place, and time. She appears well-developed and well-nourished.  HENT:  Head: Normocephalic and atraumatic.  Eyes: Conjunctivae normal are normal. Pupils are equal, round, and reactive to light.  Neck: Normal range of motion. Neck supple.  Cardiovascular: Normal rate and regular rhythm.   Respiratory: Effort normal.  GI: Soft.  Musculoskeletal: Normal range of motion.  Neurological: She is alert and oriented to person, place, and time.  Skin: Skin is warm and dry.  Psychiatric: She has a normal mood and affect. Her behavior is normal. Judgment and thought  content normal.    Prenatal labs: ABO, Rh: --/--/B POS (04/25 8295) Antibody:   Rubella:   RPR:    HBsAg:    HIV:    GBS:      U/S:  15 weeks.  Normal AFI.  Asymmetric placenta previa.  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   Assessment/Plan: 15 weeks.  Vaginal bleeding.  Stable.  Admit.  IVF's and bedrest.   Trejan Buda A 01/22/2012, 12:27 AM

## 2012-01-22 NOTE — Consult Note (Signed)
MFM consult  31 yr old [id number] at [redacted]w[redacted]d with history of 3 C sections (one classical) with placenta previa and vaginal bleeding referred by Dr. Gaynell Face for ultrasound and consult secondary to concern for placenta increta.   Past OB hx: Full term classical C section secondary to transverse lie of fetus Two full term repeat C sections  Patient reports no significant past medical history  Patient reports started having heavy bleeding last night and presented to the hospital. No leaking of fluid, no contractions.  Ultrasound today shows: anterior placenta with a complete previa. The uteroplacental interface does not appear intact around the area of the bladder. 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 The bladder does not appear to be involved. Normal amniotic fluid volume. The limited anatomy survey is normal.    I discussed the findings of the ultrasound with the patient and counseled her as follows:   1. Concern for placenta accreta/increta:  - discussed this early in pregnancy diagnosis of placenta accreta/increta cannot be certain however the findings on ultrasound are consistent with placenta accreta and perhaps increta. Does not appear to be percreta and the bladder does not seem to be involved. There is a complete previa. - discussed that if this were a placenta accreta/increta there is significant risk of bleeding in pregnancy again; potential for severe bleeding requiring blood transfusions and possible emergent delivery - discussed the option of termination via a en bloc hysterectomy if accreta is confirmed by 2 examiners; discussed if this was desired it should be done prior to 20 weeks as pregnancy termination cannot be done electively after 20 weeks and only done in the setting of maternal danger - discussed option of expectant management; again reiterated the risk of other bleeding episodes, possibility of prolonged hospitalization if  has multiple bleeding episodes, and discussed if diagnosis of accreta/increta were confirmed patient would need a hysterectomy at the time of delivery - discussed if all remains stable would recommend a course of betamethasone in the 33rd week and Cesarean hysterectomy at [redacted] weeks gestation or sooner if developed bleeding that could not be controlled or was requiring multiple transfusions or if there were nonreassuring fetal status - discussed if a major bleeding episode requiring intervention happens prior to 23-24 weeks we would recommend hysterectomy en bloc as the fetus would not be able to survive; if after that would recommend Cesarean hysterectomy  - discussed details of Cesarean hysterectomy including risks of significant bleeding often requiring transfusion, damage to surrounding organs, infection, thromboembolic event, or rarely death - discussed bleeding episodes can pose significant risk to the patient and the fetus - per Dr. Gaynell Face delivery will be done at [facility] - also discussed possibility of misdiagnosis and if placenta detaches during Cesarean and there is no active bleeding at the site; no hysterectomy needs to be done - discussed diagnosis will become more clear as pregnancy advances; will consider an MRI at [redacted] weeks gestation to better evaluate if there is an accreta and to what extent - discussed possibility of bladder involvement and that this would need to be repaired at the time if the bladder were involved   - after counseling the patient and her husband wish to pursue expectant management  2. Recommend inpatient management until no vaginal bleeding fo 48-72 hours; then recommend no strenuous activity and pelvic rest  3. Recommend follow up ultrasound in 3 weeks to reevaluate the placenta and perform the fetal anatomic survey  4. Recommend bleeding precautions and transfuse to keep hemoglobin >8  5. Recommend fetal growth every 4 weeks starting at [redacted] weeks  gestation  Discussed with Dr. Gaynell Face  I spent 60 minutes in face to face consultation with the patient in addition to time spent on the ultrasound.  Eulis Foster, MD

## 2012-01-22 NOTE — Progress Notes (Signed)
UR chart review completed.  

## 2012-01-22 NOTE — Progress Notes (Signed)
Patient ID: Brooke Lam, female   DOB: Jul 29, 1980, 31 y.o.   MRN: [id number] Vital signs normal hemoglobin 9.2 down from 11 There has been no further bleeding ultrasound suggestive patient has placenta previa placenta increta it was    explained to her that there'll be episodes of bleeding throughout the pregnancy possibly resulting in transfusion   and result might be that she'll have to have a hysterectomy patient understands the discussion she'll be transferred from the ICU today

## 2012-01-22 NOTE — Progress Notes (Signed)
[facility] ultrasound  Indication: 30 yr old [id number] at [redacted]w[redacted]d with history of 3 C sections (one classical) with placenta previa and vaginal bleeding for ultrasound secondary to concern for placenta increta.  Findings: 1. Single intrauterine pregnancy. 2. Anterior placenta with a complete previa. 3. The uteroplacental interface does not appear intact around the area of the bladder. 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 The bladder does not appear to be involved. 4. Normal amniotic fluid volume. 5. The limited anatomy survey is normal.  Recommendations: 1. Concern for placenta accreta/increta: - see consult letter 2. Recommend inpatient management until no vaginal bleeding fo 48-72 hours; then recommend no strenuous activity and pelvic rest 3. Recommend follow up ultrasound in 3 weeks to reevaluate the placenta and perform the fetal anatomic survey 4. Recommend bleeding precautions and transfuse to keep hemoglobin >8 5. Recommend fetal growth every 4 weeks starting at [redacted] weeks gestation  Eulis Foster, MD

## 2012-01-23 NOTE — Progress Notes (Signed)
Patient ID: Brooke Lam, female   DOB: Jun 02, 1980, 31 y.o.   MRN: [id number] Vital signs normal and No bleeding Legs negative Yesterday patient was seen by MFM and plan is to see fingertip 34 weeks in the cesarean hysterectomy at that point MFM in preserved and that there is some sign of treat a possible increta in in a lot of placenta previa patient understands that she might have episodes of bleeding requiring hospitalization and transfusion and and possibly emergency surgery she'll be observed until tomorrow and discharged

## 2012-01-24 MED ORDER — PRENATAL MULTIVITAMIN CH: 1.0000 | ORAL_TABLET | Freq: Every morning | ORAL | Status: DC

## 2012-01-24 NOTE — Progress Notes (Signed)
Interpreter, Dr. Gaynell Face and writer present in room during discharge instructions.  MD provided detailed instructions regarding placenta previa and answered questions from patient and spouse at bedside.  Patient instructed not to have intercourse for the remainder of pregnancy.  Medications, follow up appointments, activity instructions, community resources and when to call the doctor and/or when to come to MAU discussed.  No questions at this time.  Patient left unit with personal belongings in stable condition accompanied by staff member.  Osvaldo Angst, RN----------

## 2012-01-24 NOTE — Discharge Summary (Signed)
  Patient was admitted at 14 weeks bleeding quite heavy which occurred after sex ultrasound showed that she had a percent of previa and possible accreta or increta her hemoglobin dropped to 9.2 she was seen by MFM and a him since she's been admitted she's had no more bleeding she is to be discharged today to see me in 4 weeks and it was emphasized to the patient and her husband that she should limit her activity and you should the no sex of any kind she is to follow up with me in 4 weeks she is on iron twice a day

## 2012-02-03 ENCOUNTER — Encounter: Payer: Self-pay | Admitting: Obstetrics & Gynecology

## 2012-03-02 NOTE — Addendum Note (Signed)
Encounter addended by: Saintclair Halsted, RN on: 03/02/2012  2:34 PM<BR>     Documentation filed: Charges VN

## 2012-03-30 ENCOUNTER — Encounter (HOSPITAL_COMMUNITY): Payer: Self-pay | Admitting: *Deleted

## 2012-03-30 ENCOUNTER — Observation Stay (HOSPITAL_COMMUNITY): Payer: Medicaid Other

## 2012-03-30 ENCOUNTER — Observation Stay (HOSPITAL_COMMUNITY)
Admission: AD | Admit: 2012-03-30 | Discharge: 2012-03-31 | Payer: Medicaid Other | Source: Ambulatory Visit | Attending: Obstetrics & Gynecology | Admitting: Obstetrics & Gynecology

## 2012-03-30 DIAGNOSIS — O4410 Placenta previa with hemorrhage, unspecified trimester: Principal | ICD-10-CM | POA: Insufficient documentation

## 2012-03-30 DIAGNOSIS — R300 Dysuria: Secondary | ICD-10-CM | POA: Insufficient documentation

## 2012-03-30 LAB — URINE MICROSCOPIC-ADD ON

## 2012-03-30 LAB — CBC
Hemoglobin: 10.7 g/dL — ABNORMAL LOW (ref 12.0–15.0)
MCH: 27.9 pg (ref 26.0–34.0)
MCHC: 33.3 g/dL (ref 30.0–36.0)
MCV: 83.8 fL (ref 78.0–100.0)
Platelets: 179 10*3/uL (ref 150–400)
RBC: 3.83 MIL/uL — ABNORMAL LOW (ref 3.87–5.11)

## 2012-03-30 LAB — URINALYSIS, ROUTINE W REFLEX MICROSCOPIC
Nitrite: NEGATIVE
Protein, ur: NEGATIVE mg/dL
Specific Gravity, Urine: <1.005 — ABNORMAL LOW (ref 1.005–1.030)
Urobilinogen, UA: 0.2 mg/dL (ref 0.0–1.0)

## 2012-03-30 MED ORDER — DOCUSATE SODIUM 100 MG PO CAPS: 100.0000 mg | ORAL_CAPSULE | Freq: Every day | ORAL | Status: DC

## 2012-03-30 MED ORDER — ACETAMINOPHEN 325 MG PO TABS: 650.0000 mg | ORAL_TABLET | ORAL | Status: DC | PRN

## 2012-03-30 MED ORDER — LACTATED RINGERS IV SOLN
INTRAVENOUS | Status: DC
  Administered 2012-03-30 – 2012-03-31 (×3): via INTRAVENOUS

## 2012-03-30 MED ORDER — PRENATAL MULTIVITAMIN CH: 1.0000 | ORAL_TABLET | Freq: Every day | ORAL | Status: DC

## 2012-03-30 MED ORDER — ZOLPIDEM TARTRATE 5 MG PO TABS: 5.0000 mg | ORAL_TABLET | Freq: Every evening | ORAL | Status: DC | PRN

## 2012-03-30 MED ORDER — CALCIUM CARBONATE ANTACID 500 MG PO CHEW: 2.0000 | CHEWABLE_TABLET | ORAL | Status: DC | PRN

## 2012-03-30 NOTE — MAU Note (Signed)
Pt reports she when to the restroom about 30 minutes ago and noticed blood in the toilet and 2 clots.no pain. Denies dysuria. Last intercourse 2 months ago. Pt reports placenta previa.

## 2012-03-30 NOTE — H&P (Signed)
Brooke Lam is a 32 y.o. female presenting for bleeding.  She had one episode this afternoon with two small clots. Denies contractions or pain. No recent intercourse. Has known previa with probable accreta/increta.  Was transferred by Dr Clearance Coots to [facility] for prenatal care. Was told by them that if she has bleeding, she was to come here.   RN Note:  Pt reports she when to the restroom about 30 minutes ago and noticed blood in the toilet and 2 clots.no pain. Denies dysuria. Last intercourse 2 months ago. Pt reports placenta previa.       Maternal Medical History:  Reason for admission: Reason for admission: vaginal bleeding.  Reason for Admission:   nauseaContractions: Onset was 1-2 hours ago.    Fetal activity: Perceived fetal activity is normal.   Last perceived fetal movement was within the past hour.    Prenatal complications: Bleeding and placental abnormality.     OB History    Grav Para Term Preterm Abortions TAB SAB Ect Mult Living   6 3 3  0 2 0 1 1 0 3     Past Medical History  Diagnosis Date  . Ectopic pregnancy    Past Surgical History  Procedure Date  . Cesarian section   . Cesarean section    Family History: family history includes Diabetes in her father.  There is no history of Anesthesia problems, and Hearing loss, and Other, . Social History:  reports that she has quit smoking. Her smoking use included Cigarettes. She has never used smokeless tobacco. She reports that she does not drink alcohol or use illicit drugs.   Prenatal Transfer Tool  Maternal Diabetes: No Genetic Screening: Normal Maternal Ultrasounds/Referrals: Abnormal:  Findings:   Other: Fetal Ultrasounds or other Referrals:  None Maternal Substance Abuse:  No Significant Maternal Medications:  None Significant Maternal Lab Results:  None Other Comments:  None  Review of Systems  Constitutional: Negative for fever and malaise/fatigue.  Gastrointestinal: Negative for nausea, vomiting,  abdominal pain, diarrhea and constipation.       Vaginal bleeding once this afternoon with two small clots   Genitourinary: Negative for dysuria.  Neurological: Negative for weakness.      Blood pressure 113/76, pulse 98, temperature 98.6 F (37 C), temperature source Oral, resp. rate 16, height 5\' 3"  (1.6 m), weight 181 lb (82.101 kg), last menstrual period 10/08/2011, SpO2 100.00%. Maternal Exam:  Uterine Assessment: Contraction frequency is rare.   Abdomen: Surgical scars: low transverse and supraumbilical midline.   Introitus: Normal vulva. Vagina is negative for discharge.  Ferning test: not done.  Nitrazine test: not done. Amniotic fluid character: not assessed.     Fetal Exam Fetal Monitor Review: Mode: ultrasound.   Variability: moderate (6-25 bpm).   Pattern: no decelerations.    Fetal State Assessment: Category I - tracings are normal.     Physical Exam  Constitutional: She is oriented to person, place, and time. She appears well-developed and well-nourished. No distress.  Cardiovascular: Normal rate.   Respiratory: Effort normal.  GI: Soft. She exhibits no distension and no mass. There is no tenderness. There is no rebound and no guarding.  Genitourinary: No vaginal discharge found.       Pelvic deferred secondary to previa No blood on pad now   Musculoskeletal: Normal range of motion.  Neurological: She is alert and oriented to person, place, and time.  Skin: Skin is warm and dry.  Psychiatric: She has a normal mood and affect.  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

## 2012-03-31 ENCOUNTER — Encounter (HOSPITAL_COMMUNITY): Payer: Medicaid Other

## 2012-03-31 LAB — TYPE AND SCREEN
ABO/RH(D): B POS
Unit division: 0

## 2012-03-31 NOTE — H&P (Signed)
32 yr old [id number], at 24+6 weeks c Dx of placenta previa , possible accreta, percreta. Will admit, be sure pt remains stable. Care to be coordinated by MFM, as pt will continue to come here if bleeding occurs. Pt currently stable. Hgb 10, will be candidate for i.v. Iron. Also.l

## 2012-03-31 NOTE — Progress Notes (Signed)
POC discussed with pt regarding transfer to [facility]

## 2012-03-31 NOTE — Progress Notes (Signed)
Patient ID: Brooke Lam, female   DOB: 06-18-1980, 32 y.o.   MRN: [id number]   Pt seen earlier by Dr. Emelda Fear.  Discussed with MFM about transfer (pt recieves prenatal care at [facility] office in [facility]).  Bleeding has stopped.  PT is stable for transfer. Dr. Claudean Severance accepted care.   Pt agrees to transfer.

## 2012-03-31 NOTE — Progress Notes (Signed)
[facility] here to transfer pt to [facility]

## 2012-03-31 NOTE — Discharge Summary (Signed)
  32 year old [id number] at 25 weeks with known increta and followed by MFM at [facility].  Pt came to [facility] for period like bleeding.  Bleeding has now stopped.  MFM accepts transfer as patient is stable.  Pt agrees to transfer.

## 2012-03-31 NOTE — Progress Notes (Signed)
FACULTY PRACTICE ANTEPARTUM(COMPREHENSIVE) NOTE  Brooke Lam is a 32 y.o. [id number] at [redacted]w[redacted]d by early ultrasound who is admitted for bleeding x 1 clot, Known Placenta previa, suspected Accreta. Prior Cesarean x 2 in [place].   Fetal presentation is unsure.preliminary u/s report to be finalized this morning Length of Stay:  1  Days  Subjective: No bleeding overnight. Patient reports the fetal movement as active. Patient reports uterine contraction  activity as none. Patient reports  vaginal bleeding as none. Patient describes fluid per vagina as None.  Vitals:  Blood pressure 90/47, pulse 79, temperature 98.6 F (37 C), temperature source Oral, resp. rate 18, height 5\' 3"  (1.6 m), weight 82.101 kg (181 lb), last menstrual period 10/08/2011, SpO2 100.00%. Physical Examination:  General appearance - alert, well appearing, and in no distress and oriented to person, place, and time Heart - normal rate and regular rhythm Abdomen - soft, nontender, nondistended Fundal Height:  size equals dates Cervical Exam: Not evaluated. . Extremities: extremities normal, atraumatic, no cyanosis or edema and Homans sign is negative, no sign of DVT with DTRs 2+ bilaterally Membranes:intact  Fetal Monitoring:  Baseline: 145, reactive , good for 25 weeks bpm  Labs:  Recent Results (from the past 24 hour(s))  URINALYSIS, ROUTINE W REFLEX MICROSCOPIC   Collection Time   03/30/12  7:27 PM      Component Value Range   Color, Urine YELLOW  YELLOW   APPearance CLEAR  CLEAR   Specific Gravity, Urine <1.005 (*) 1.005 - 1.030   pH 6.0  5.0 - 8.0   Glucose, UA NEGATIVE  NEGATIVE mg/dL   Hgb urine dipstick MODERATE (*) NEGATIVE   Bilirubin Urine NEGATIVE  NEGATIVE   Ketones, ur NEGATIVE  NEGATIVE mg/dL   Protein, ur NEGATIVE  NEGATIVE mg/dL   Urobilinogen, UA 0.2  0.0 - 1.0 mg/dL   Nitrite NEGATIVE  NEGATIVE   Leukocytes, UA NEGATIVE  NEGATIVE  URINE MICROSCOPIC-ADD ON   Collection Time   03/30/12  7:27 PM        Component Value Range   Squamous Epithelial / LPF RARE  RARE   WBC, UA 0-2  <3 WBC/hpf   RBC / HPF 3-6  <3 RBC/hpf   Bacteria, UA FEW (*) RARE  CBC   Collection Time   03/30/12  8:40 PM      Component Value Range   WBC 7.6  4.0 - 10.5 K/uL   RBC 3.83 (*) 3.87 - 5.11 MIL/uL   Hemoglobin 10.7 (*) 12.0 - 15.0 g/dL   HCT 64.4 (*) 03.4 - 74.2 %   MCV 83.8  78.0 - 100.0 fL   MCH 27.9  26.0 - 34.0 pg   MCHC 33.3  30.0 - 36.0 g/dL   RDW 59.5  63.8 - 75.6 %   Platelets 179  150 - 400 K/uL  TYPE AND SCREEN   Collection Time   03/30/12  8:40 PM      Component Value Range   ABO/RH(D) B POS     Antibody Screen NEG     Sample Expiration 04/02/2012     Unit Number [id number]     Blood Component Type RED CELLS,LR     Unit division 00     Status of Unit ALLOCATED     Transfusion Status OK TO TRANSFUSE     Crossmatch Result Compatible     Unit Number [id number]     Blood Component Type RED CELLS,LR     Unit division 00  Status of Unit ALLOCATED     Transfusion Status OK TO TRANSFUSE     Crossmatch Result Compatible    PREPARE RBC (CROSSMATCH)   Collection Time   03/30/12  9:00 PM      Component Value Range   Order Confirmation ORDER PROCESSED BY BLOOD BANK      Imaging Studies:     Currently EPIC will not allow sonographic studies to automatically populate into notes.  In the meantime, copy and paste results into note or free text.  Medications:  Scheduled    . docusate sodium  100 mg Oral Daily  . prenatal multivitamin  1 tablet Oral Daily   I have reviewed the patient's current medications.  ASSESSMENT: Patient Active Problem List  Diagnosis  . Dysuria    PLAN: MFM consult this a.m. To discuss whether to keep here or transfer, and long term pt advice , as pt reports that she has been told to come here for any bleeding Consider iv iron to boost hgb.  Corgan Mormile V 03/31/2012,7:50 AM

## 2012-05-10 ENCOUNTER — Encounter: Payer: Self-pay | Admitting: *Deleted

## 2012-05-12 NOTE — Telephone Encounter (Signed)
Received follow-up in prenatal care.

## 2012-05-20 ENCOUNTER — Inpatient Hospital Stay (HOSPITAL_COMMUNITY)
Admission: AD | Admit: 2012-05-20 | Discharge: 2012-05-20 | Disposition: A | Discharge status: Home / Self Care | Payer: Self-pay | Source: Ambulatory Visit | Attending: Obstetrics and Gynecology | Admitting: Obstetrics and Gynecology

## 2012-05-20 ENCOUNTER — Encounter (HOSPITAL_COMMUNITY): Payer: Self-pay | Admitting: *Deleted

## 2012-05-20 DIAGNOSIS — N390 Urinary tract infection, site not specified: Secondary | ICD-10-CM | POA: Insufficient documentation

## 2012-05-20 DIAGNOSIS — R109 Unspecified abdominal pain: Secondary | ICD-10-CM | POA: Insufficient documentation

## 2012-05-20 LAB — CBC
HCT: 31.3 % — ABNORMAL LOW (ref 36.0–46.0)
Hemoglobin: 10.1 g/dL — ABNORMAL LOW (ref 12.0–15.0)
MCHC: 32.3 g/dL (ref 30.0–36.0)
RBC: 3.6 MIL/uL — ABNORMAL LOW (ref 3.87–5.11)
WBC: 6.5 10*3/uL (ref 4.0–10.5)

## 2012-05-20 LAB — URINE MICROSCOPIC-ADD ON

## 2012-05-20 LAB — URINALYSIS, ROUTINE W REFLEX MICROSCOPIC
Glucose, UA: NEGATIVE mg/dL
Nitrite: NEGATIVE
Specific Gravity, Urine: 1.02 (ref 1.005–1.030)
pH: 5.5 (ref 5.0–8.0)

## 2012-05-20 MED ORDER — CEPHALEXIN 500 MG PO CAPS: 500.0000 mg | ORAL_CAPSULE | Freq: Three times a day (TID) | ORAL | Status: DC

## 2012-05-20 MED ORDER — SULFAMETHOXAZOLE-TMP DS 800-160 MG PO TABS: 1.0000 | ORAL_TABLET | Freq: Two times a day (BID) | ORAL | Status: DC

## 2012-05-20 NOTE — MAU Note (Signed)
Pt had fever @ 2100 of 103.4, then @ 0400 was 104.5, then @ 0600 was 104.  Pt took ibuprofen @ that time.

## 2012-05-20 NOTE — MAU Note (Signed)
Pt states she had C/S @ [facility] on 2/6 because of placenta previa & accreta.  Pt is having R lower back & flank pain, that radiates into her lower abdomen.  Also reports high fever last night.  Pt states she saw her dr on the 24th & he said everything was fine, for her to keep taking her ibuprofen & hydrocodone, but pt says the meds wear off very quickly.

## 2012-05-20 NOTE — MAU Provider Note (Signed)
History     CSN: [id number]  Arrival date and time: 05/20/12 4098   None     Chief Complaint  Patient presents with  . Back Pain  . Abdominal Pain   HPI 32 y.o. [id number] here with right flank pain and dysuria x 4 days. Pain radiates to front right side, has "severe" burning with urination. Fever of 104.3 at home last night. Had Motrin at 6 AM this morning. Hydrocodone helps with pain, motrin does not. Saw her dr on the 24th for this pain, was told that "everything was fine" but to f/u if she had fever. + nausea, no vomiting, normal bowel function.   Past Medical History  Diagnosis Date  . Ectopic pregnancy   . Preterm labor     Past Surgical History  Procedure Laterality Date  . Cesarian section    . Cesarean section      C/S x 4  . Abdominal hysterectomy      Family History  Problem Relation Age of Onset  . Anesthesia problems Neg Hx   . Hearing loss Neg Hx   . Other Neg Hx   . Diabetes Father     History  Substance Use Topics  . Smoking status: Former Smoker    Types: Cigarettes  . Smokeless tobacco: Never Used     Comment: quit 2005  . Alcohol Use: No    Allergies: No Known Allergies  Prescriptions prior to admission  Medication Sig Dispense Refill  . docusate sodium (COLACE) 100 MG capsule Take 100 mg by mouth every 6 (six) hours as needed for constipation.      [name] HYDROcodone-acetaminophen (NORCO/VICODIN) 5-325 MG per tablet Take 1 tablet by mouth every 6 (six) hours as needed for pain.      [name] ibuprofen (ADVIL,MOTRIN) 800 MG tablet Take 800 mg by mouth every 8 (eight) hours as needed for pain.      . Prenatal Vit-Fe Fumarate-FA (PRENATAL MULTIVITAMIN) TABS Take 1 tablet by mouth every morning.  30 tablet  8    Review of Systems  Constitutional: Positive for fever and chills.  Gastrointestinal: Positive for nausea and abdominal pain. Negative for vomiting, diarrhea and constipation.  Genitourinary: Positive for dysuria and flank pain.  Musculoskeletal:  Positive for back pain.   Physical Exam   Blood pressure 109/66, pulse 104, temperature 98.6 F (37 C), temperature source Oral, resp. rate 18, weight 166 lb 3.2 oz (75.388 kg), last menstrual period 10/08/2011, unknown if currently breastfeeding.  Physical Exam  Nursing note and vitals reviewed. Constitutional: She is oriented to person, place, and time. She appears well-developed and well-nourished. No distress.  Cardiovascular: Normal rate.   Respiratory: Effort normal.  GI: Soft. She exhibits no distension and no mass. There is no tenderness. There is CVA tenderness (right, mild). There is no rebound and no guarding.  Musculoskeletal: Normal range of motion.  Neurological: She is alert and oriented to person, place, and time.  Skin: Skin is warm and dry.  Psychiatric: She has a normal mood and affect.    MAU Course  Procedures Results for orders placed during the hospital encounter of 05/20/12 (from the past 24 hour(s))  URINALYSIS, ROUTINE W REFLEX MICROSCOPIC     Status: Abnormal   Collection Time    05/20/12  8:44 AM      Result Value Range   Color, Urine YELLOW  YELLOW   APPearance CLEAR  CLEAR   Specific Gravity, Urine 1.020  1.005 - 1.030  pH 5.5  5.0 - 8.0   Glucose, UA NEGATIVE  NEGATIVE mg/dL   Hgb urine dipstick LARGE (*) NEGATIVE   Bilirubin Urine NEGATIVE  NEGATIVE   Ketones, ur NEGATIVE  NEGATIVE mg/dL   Protein, ur 30 (*) NEGATIVE mg/dL   Urobilinogen, UA 0.2  0.0 - 1.0 mg/dL   Nitrite NEGATIVE  NEGATIVE   Leukocytes, UA SMALL (*) NEGATIVE  URINE MICROSCOPIC-ADD ON     Status: Abnormal   Collection Time    05/20/12  8:44 AM      Result Value Range   Squamous Epithelial / LPF FEW (*) RARE   WBC, UA 11-20  <3 WBC/hpf   RBC / HPF 3-6  <3 RBC/hpf   Bacteria, UA FEW (*) RARE  CBC     Status: Abnormal   Collection Time    05/20/12  9:55 AM      Result Value Range   WBC 6.5  4.0 - 10.5 K/uL   RBC 3.60 (*) 3.87 - 5.11 MIL/uL   Hemoglobin 10.1 (*) 12.0 -  15.0 g/dL   HCT 13.0 (*) 86.5 - 78.4 %   MCV 86.9  78.0 - 100.0 fL   MCH 28.1  26.0 - 34.0 pg   MCHC 32.3  30.0 - 36.0 g/dL   RDW 69.6  29.5 - 28.4 %   Platelets 191  150 - 400 K/uL     Assessment and Plan  32 y.o. [id number] with UTI Rx Keflex 500 mg 1 po tid x 7 days F/U with regular dr if symptoms do not improve    Whitlee Sluder 05/20/2012, 9:25 AM

## 2012-05-20 NOTE — MAU Note (Signed)
Pt took ibuprofen @ 0600 this morning, last took hydrocodone last night.

## 2012-05-22 LAB — URINE CULTURE

## 2012-05-25 NOTE — MAU Provider Note (Signed)
Attestation of Attending Supervision of Advanced Practitioner (CNM/NP): Evaluation and management procedures were performed by the Advanced Practitioner under my supervision and collaboration.  I have reviewed the Advanced Practitioner's note and chart, and I agree with the management and plan.  Damyra Luscher 05/25/2012 9:08 AM   

## 2012-06-02 ENCOUNTER — Ambulatory Visit: Payer: Self-pay | Admitting: Obstetrics & Gynecology

## 2012-06-15 ENCOUNTER — Ambulatory Visit: Payer: Self-pay | Admitting: Advanced Practice Midwife

## 2012-06-29 ENCOUNTER — Ambulatory Visit: Payer: Self-pay | Admitting: Advanced Practice Midwife

## 2012-08-01 ENCOUNTER — Ambulatory Visit (INDEPENDENT_AMBULATORY_CARE_PROVIDER_SITE_OTHER): Payer: Self-pay | Admitting: Obstetrics and Gynecology

## 2012-08-01 ENCOUNTER — Encounter: Payer: Self-pay | Admitting: Obstetrics and Gynecology

## 2012-08-01 DIAGNOSIS — Z90710 Acquired absence of both cervix and uterus: Secondary | ICD-10-CM | POA: Insufficient documentation

## 2012-08-01 LAB — CBC
Hemoglobin: 12.4 g/dL (ref 12.0–15.0)
MCV: 78.1 fL (ref 78.0–100.0)
Platelets: 250 10*3/uL (ref 150–400)
RBC: 4.74 MIL/uL (ref 3.87–5.11)
WBC: 6 10*3/uL (ref 4.0–10.5)

## 2012-08-01 NOTE — Progress Notes (Signed)
  Subjective:     Brooke Lam is a 32 y.o. female [id number] s/p repeat cesarean section with hysterectomy on 05/01/2012 who presents for a postpartum visit. Patient received most of her prenatal care at [facility]. She is 3 months postpartum following a cesarean-hysterectomy secondary to bleeding placenta previa with increta at [place]. I have fully reviewed the prenatal and intrapartum course. The delivery was at 29 gestational weeks. Outcome: cesarean-hysterectomy. Anesthesia: general. Postpartum course has been uncomplicated. Baby's course has been complicated by NICU admission for 2 1/2 months secondary to prematurity. Baby is feeding by breast. Bleeding no bleeding. Bowel function is normal. Bladder function is normal. Patient is sexually active. Contraception method is status post hysterectomy. Postpartum depression screening: negative. Patient complaining of burning with urination and desires to check her iron levels.     Review of Systems A comprehensive review of systems was negative.   Objective:    BP 112/78  Pulse 79  Temp(Src) 97.4 F (36.3 C)  Wt 167 lb 14.4 oz (76.159 kg)  BMI 29.75 kg/m2  Breastfeeding? Yes  General:  alert, cooperative and no distress   Breasts:  inspection negative, no nipple discharge or bleeding, no masses or nodularity palpable  Lungs: clear to auscultation bilaterally  Heart:  regular rate and rhythm, S1, S2 normal, no murmur, click, rub or gallop  Abdomen: soft, non-tender; bowel sounds normal; no masses,  no organomegaly   Vulva:  normal  Vagina: normal vagina, no discharge, exudate, lesion, or erythema  Cervix:  absent  Corpus: uterus absent  Adnexa:  no mass, fullness, tenderness  Rectal Exam: Not performed.        Assessment:     Normal postpartum exam. Pap smear not done at today's visit.   Plan:    1. Contraception: status post hysterectomy 2. Will check urine culture and cbc 3. Follow up in: 1 year or as needed.  4. Patient  reports no history of abnormal pap smear

## 2013-05-02 ENCOUNTER — Emergency Department (HOSPITAL_COMMUNITY)
Admission: EM | Admit: 2013-05-02 | Discharge: 2013-05-02 | Disposition: A | Discharge status: Home / Self Care | Payer: Medicaid Other | Attending: Emergency Medicine | Admitting: Emergency Medicine

## 2013-05-02 ENCOUNTER — Ambulatory Visit: Payer: Medicaid Other | Attending: Internal Medicine | Admitting: Internal Medicine

## 2013-05-02 ENCOUNTER — Encounter: Payer: Self-pay | Admitting: Internal Medicine

## 2013-05-02 ENCOUNTER — Emergency Department (HOSPITAL_COMMUNITY): Payer: Medicaid Other

## 2013-05-02 ENCOUNTER — Encounter (HOSPITAL_COMMUNITY): Payer: Self-pay | Admitting: Emergency Medicine

## 2013-05-02 VITALS — BP 113/72 | HR 80 | Temp 98.6°F | Resp 14 | Ht 63.0 in | Wt 176.4 lb

## 2013-05-02 DIAGNOSIS — R142 Eructation: Secondary | ICD-10-CM | POA: Insufficient documentation

## 2013-05-02 DIAGNOSIS — R141 Gas pain: Secondary | ICD-10-CM | POA: Insufficient documentation

## 2013-05-02 DIAGNOSIS — R143 Flatulence: Secondary | ICD-10-CM

## 2013-05-02 DIAGNOSIS — R509 Fever, unspecified: Secondary | ICD-10-CM | POA: Insufficient documentation

## 2013-05-02 DIAGNOSIS — R630 Anorexia: Secondary | ICD-10-CM | POA: Insufficient documentation

## 2013-05-02 DIAGNOSIS — Z8751 Personal history of pre-term labor: Secondary | ICD-10-CM | POA: Insufficient documentation

## 2013-05-02 DIAGNOSIS — R1031 Right lower quadrant pain: Secondary | ICD-10-CM | POA: Insufficient documentation

## 2013-05-02 DIAGNOSIS — Z87891 Personal history of nicotine dependence: Secondary | ICD-10-CM | POA: Insufficient documentation

## 2013-05-02 DIAGNOSIS — R109 Unspecified abdominal pain: Secondary | ICD-10-CM

## 2013-05-02 DIAGNOSIS — N949 Unspecified condition associated with female genital organs and menstrual cycle: Secondary | ICD-10-CM | POA: Insufficient documentation

## 2013-05-02 DIAGNOSIS — N83209 Unspecified ovarian cyst, unspecified side: Secondary | ICD-10-CM

## 2013-05-02 DIAGNOSIS — R100 Acute abdomen: Secondary | ICD-10-CM

## 2013-05-02 DIAGNOSIS — R300 Dysuria: Secondary | ICD-10-CM | POA: Insufficient documentation

## 2013-05-02 DIAGNOSIS — Z90710 Acquired absence of both cervix and uterus: Secondary | ICD-10-CM | POA: Insufficient documentation

## 2013-05-02 DIAGNOSIS — Z8742 Personal history of other diseases of the female genital tract: Secondary | ICD-10-CM | POA: Insufficient documentation

## 2013-05-02 DIAGNOSIS — R197 Diarrhea, unspecified: Secondary | ICD-10-CM | POA: Insufficient documentation

## 2013-05-02 DIAGNOSIS — R110 Nausea: Secondary | ICD-10-CM | POA: Insufficient documentation

## 2013-05-02 LAB — CBC WITH DIFFERENTIAL/PLATELET
BASOS ABS: 0 10*3/uL (ref 0.0–0.1)
Basophils Relative: 0 % (ref 0–1)
EOS ABS: 0.1 10*3/uL (ref 0.0–0.7)
Eosinophils Relative: 1 % (ref 0–5)
HCT: 35.1 % — ABNORMAL LOW (ref 36.0–46.0)
Hemoglobin: 11.8 g/dL — ABNORMAL LOW (ref 12.0–15.0)
LYMPHS ABS: 1.1 10*3/uL (ref 0.7–4.0)
Lymphocytes Relative: 18 % (ref 12–46)
MCH: 27.1 pg (ref 26.0–34.0)
MCHC: 33.6 g/dL (ref 30.0–36.0)
MCV: 80.7 fL (ref 78.0–100.0)
Monocytes Absolute: 0.2 10*3/uL (ref 0.1–1.0)
Monocytes Relative: 4 % (ref 3–12)
NEUTROS PCT: 78 % — AB (ref 43–77)
Neutro Abs: 4.7 10*3/uL (ref 1.7–7.7)
PLATELETS: 211 10*3/uL (ref 150–400)
RBC: 4.35 MIL/uL (ref 3.87–5.11)
RDW: 13.1 % (ref 11.5–15.5)
WBC: 6 10*3/uL (ref 4.0–10.5)

## 2013-05-02 LAB — COMPREHENSIVE METABOLIC PANEL
ALBUMIN: 3.8 g/dL (ref 3.5–5.2)
ALK PHOS: 88 U/L (ref 39–117)
ALT: 7 U/L (ref 0–35)
AST: 15 U/L (ref 0–37)
BUN: 14 mg/dL (ref 6–23)
CALCIUM: 8.9 mg/dL (ref 8.4–10.5)
CO2: 24 mEq/L (ref 19–32)
Chloride: 103 mEq/L (ref 96–112)
Creatinine, Ser: 0.59 mg/dL (ref 0.50–1.10)
GFR calc non Af Amer: >90 mL/min (ref >90)
GLUCOSE: 99 mg/dL (ref 70–99)
POTASSIUM: 3.9 meq/L (ref 3.7–5.3)
SODIUM: 138 meq/L (ref 137–147)
TOTAL PROTEIN: 7.7 g/dL (ref 6.0–8.3)
Total Bilirubin: 0.3 mg/dL (ref 0.3–1.2)

## 2013-05-02 LAB — URINALYSIS, ROUTINE W REFLEX MICROSCOPIC
Bilirubin Urine: NEGATIVE
Glucose, UA: NEGATIVE mg/dL
Ketones, ur: NEGATIVE mg/dL
LEUKOCYTES UA: NEGATIVE
Nitrite: NEGATIVE
Protein, ur: NEGATIVE mg/dL
SPECIFIC GRAVITY, URINE: 1.024 (ref 1.005–1.030)
UROBILINOGEN UA: 1 mg/dL (ref 0.0–1.0)
pH: 7 (ref 5.0–8.0)

## 2013-05-02 LAB — WET PREP, GENITAL
Trich, Wet Prep: NONE SEEN
YEAST WET PREP: NONE SEEN

## 2013-05-02 LAB — LIPASE, BLOOD: Lipase: 41 U/L (ref 11–59)

## 2013-05-02 LAB — URINE MICROSCOPIC-ADD ON

## 2013-05-02 MED ORDER — IOHEXOL 300 MG/ML  SOLN
25.0000 mL | INTRAMUSCULAR | Status: AC
  Administered 2013-05-02: 25 mL via ORAL

## 2013-05-02 MED ORDER — IOHEXOL 300 MG/ML  SOLN
100.0000 mL | Freq: Once | INTRAMUSCULAR | Status: DC | PRN
  Administered 2013-05-02: 100 mL via INTRAVENOUS

## 2013-05-02 NOTE — ED Notes (Signed)
Patient transported to Ultrasound 

## 2013-05-02 NOTE — ED Provider Notes (Signed)
Medical screening examination/treatment/procedure(s) were performed by non-physician practitioner and as supervising physician I was immediately available for consultation/collaboration.    Kathalene Frames, MD 05/02/13 2033

## 2013-05-02 NOTE — ED Notes (Addendum)
Lower abd pain since yesterday has dysuria some nausea had  Fever today swollen abd states has had a hysterectomy  Was seen at [facility] sent for further test

## 2013-05-02 NOTE — Progress Notes (Signed)
Pt is here to establish care. Pt is using the interpreter line. Complains of pelvic pain, burning and itching while urinating, pain while walking and abdominal bloating x2 days. Itching only lasted 1 day. Also complains of nausea, no vomiting, and a recent fever of 102. Temperature is 98.6 today. Took Tylenol OTC.

## 2013-05-02 NOTE — Discharge Instructions (Signed)
Follow-up with your doctor and women's health specialist  Rest and drink fluids  Take Tylenol for pain  Return to the emergency department if you develop any changing/worsening condition, repeated vomiting, blood in your stool/urine, fever, or any other concerns (please read additional information regarding your condition below)   Quiste ovrico (Ovarian Cyst) Un quiste ovrico es una bolsa llena de lquido que se forma en el ovario. Los ovarios son los rganos pequeos que producen vulos en las mujeres. Se pueden formar varios tipos de [employer]. Brooke Lam no son cancerosos. Muchos de ellos no causan problemas y con frecuencia desaparecen solos. Algunos pueden provocar sntomas y requerir TEFL teacher. Los tipos ms comunes de quistes ovricos son los siguientes:  Quistes funcionales: estos quistes pueden aparecer todos los meses durante el ciclo menstrual. Esto es normal. Estos quistes suelen desaparecer con el prximo ciclo menstrual si la mujer no queda embarazada. En general, los quistes funcionales no tienen sntomas.  Endometriomas: estos quistes se forman a partir del tejido que recubre el tero. Tambin se denominan "quistes de chocolate" porque se llenan de sangre que se vuelve marrn. Este tipo de quiste puede Psychologist, counselling en la zona inferior del abdomen durante la relacin sexual y con el perodo menstrual.  Cistoadenomas: este tipo se desarrolla a partir de las clulas que se [place] en el exterior del ovario. Estos quistes pueden ser muy grandes y causar dolor en la zona inferior del abdomen y durante la relacin sexual. Cleda Clarks tipo de quiste puede girar sobre s mismo, cortar el suministro de Retail buyer y causar un dolor intenso. Tambin se puede romper con facilidad y Physicist, medical.  Quistes dermoides: este tipo de quiste a veces se encuentra en ambos ovarios. Estos quistes pueden [employer] tipos de tejidos del organismo, como piel, dientes, pelo o TEFL teacher.  Generalmente no tienen sntomas, a menos que sean [address]  Quistes tecalutenicos: aparecen cuando se produce demasiada cantidad de cierta hormona (gonadotropina corinica humana) que estimula en exceso al ovario para que produzca vulos. Esto es ms frecuente despus de procedimientos que ayudan a la concepcin de un beb (fertilizacin in vitro). CAUSAS   Los medicamentos para la fertilidad pueden provocar una afeccin mediante la cual se forman mltiples quistes de gran tamao en los ovarios. Esta se denomina sndrome de hiperestimulacin ovrica.  El sndrome del ovario poliqustico es una afeccin que puede causar desequilibrios hormonales, los cuales pueden dar como resultado quistes ovricos no funcionales. SIGNOS Y SNTOMAS  Muchos quistes ovricos no causan sntomas. Si se presentan sntomas, stos pueden ser:  Dolor o molestias en la pelvis.  Dolor en la parte baja del abdomen.  Dolor durante las [employer].  Aumento del permetro abdominal (hinchazn).  Perodos menstruales anormales.  Aumento del [employer] perodos [place].  Cese de los perodos menstruales sin estar embarazada. DIAGNSTICO  Estos quistes se descubren comnmente durante un examen de rutina o una exploracin ginecolgica anual. Es posible que se ordenen otros estudios para obtener ms informacin sobre el Weir. Estos estudios pueden ser:  Regulatory affairs officer.  Radiografas de la pelvis.  Tomografa computada.  Resonancia magntica.  Anlisis de [place]. TRATAMIENTO  Muchos de los quistes ovricos desaparecen por s solos, sin tratamiento. Es probable que el mdico quiera controlar el quiste regularmente durante 2 o para ver si se produce algn cambio. En el caso de las mujeres en la menopausia, es particularmente importante controlar de cerca al quiste ya que el ndice de cncer de ovario  en las mujeres menopusicas es ms alto. Cuando se requiere TEFL teacher, este puede incluir  cualquiera de los siguientes:  Un procedimiento para drenar el quiste (aspiracin). Esto se puede realizar [employer] uso de [place] grande y [place]. Tambin se puede hacer a travs de un procedimiento laparoscpico, En este procedimiento, se inserta un tubo delgado que emite luz y que tiene una pequea cmara en un extremo (laparoscopio) a travs de una pequea incisin.  Ciruga para extirpar el quiste completo. Esto se puede realizar mediante una ciruga laparoscpica o Neomia Dear ciruga abierta, la cual implica realizar una incisin ms grande en la parte inferior del abdomen.  Tratamiento hormonal o pldoras anticonceptivas. Estos mtodos a veces se usan para ayudar a Barista. INSTRUCCIONES PARA EL CUIDADO EN EL HOGAR   Tome solo medicamentos de venta libre o recetados, segn las indicaciones del mdico.  Oceanographer a las consultas de control con su mdico segn las indicaciones.  Hgase exmenes plvicos regulares y pruebas de Papanicolaou. SOLICITE ATENCIN MDICA SI:   Los perodos se atrasan, son irregulares, dolorosos o cesan.  El dolor plvico o abdominal no desaparece.  El abdomen se agranda o se hincha.  Siente presin en la vejiga o no puede vaciarla completamente.  Siente dolor durante las [employer].  Tiene una sensacin de hinchazn, presin o Environmental manager.  Pierde peso sin razn aparente.  Siente un Engineer, maintenance (IT).  Est estreida.  Pierde el apetito.  Le aparece acn.  Nota un aumento del vello corporal y facial.  Lenora Boys de peso sin hacer modificaciones en su actividad fsica y en su dieta habitual.  Sospecha que est embarazada. SOLICITE ATENCIN MDICA DE INMEDIATO SI:   Siente cada vez ms dolor abdominal.  Tiene malestar estomacal (nuseas) y vomita.  Tiene fiebre que se presenta de [place] repentina.  Siente dolor abdominal al defecar.  Sus perodos menstruales son ms abundantes que lo  habitual. Document Released: 12/17/2004 Document Revised: 12/28/2012 ExitCare Patient Information 2014 [place], [place].  Dolor abdominal en adultos (Abdominal Pain, Adult) El dolor puede tener muchas causas. Normalmente la causa del dolor abdominal no es una enfermedad y Scientist, clinical (histocompatibility and immunogenetics) sin TEFL teacher. Frecuentemente puede controlarse y tratarse en casa. Su mdico le Medical sales representative examen fsico y posiblemente solicite anlisis de sangre y radiografas para ayudar a Chief Strategy Officer la gravedad de su dolor. Sin embargo, en [employer], debe transcurrir ms tiempo antes de que se pueda Clinical research associate una causa evidente del dolor. Antes de llegar a ese punto, es posible que su mdico no sepa si necesita ms pruebas o un tratamiento ms profundo. INSTRUCCIONES PARA EL CUIDADO EN EL HOGAR  Est atento al dolor para ver si hay cambios. Las siguientes indicaciones ayudarn a Architectural technologist que pueda sentir:  [place] solo medicamentos de venta libre o recetados, segn las indicaciones del mdico.  No tome laxantes a menos que se lo haya indicado su mdico.  Pruebe con Neomia Dear dieta lquida absoluta (caldo, t o agua) segn se lo indique su mdico. Introduzca gradualmente una dieta normal, segn su tolerancia. SOLICITE ATENCIN MDICA SI:  Tiene dolor abdominal sin explicacin.  Tiene dolor abdominal relacionado con nuseas o diarrea.  Tiene dolor cuando orina o defeca.  Experimenta dolor abdominal que lo despierta de noche.  Tiene dolor abdominal que empeora o mejora cuando come alimentos.  Tiene dolor abdominal que empeora cuando come alimentos grasosos. SOLICITE ATENCIN MDICA DE INMEDIATO SI:   El dolor no desaparece en un plazo mximo de 2horas.  Tiene fiebre.  No deja de (vomitar).  El Engineer, mining se siente solo en partes del abdomen, como el lado derecho o la parte inferior izquierda del abdomen.  Evaca materia fecal sanguinolenta o negra, de aspecto alquitranado. ASEGRESE DE QUE:  Comprende  estas instrucciones.  Controlar su afeccin.  Recibir ayuda de inmediato si no mejora o si empeora. Document Released: 03/09/2005 Document Revised: 12/28/2012 [facility] Patient Information 2014 [place], [place].   Emergency Department Resource Guide 1) Find a Doctor and Pay Out of Pocket Although you won't have to find out who is covered by your insurance plan, it is a good idea to ask around and get recommendations. You will then need to call the office and see if the doctor you have chosen will accept you as a new patient and what types of options they offer for patients who are self-pay. Some doctors offer discounts or will set up payment plans for their patients who do not have insurance, but you will need to ask so you aren't surprised when you get to your appointment.  2) Contact Your Local Health Department Not all health departments have doctors that can see patients for sick visits, but many do, so it is worth a call to see if yours does. If you don't know where your local health department is, you can check in your phone book. The CDC also has a tool to help you locate your state's health department, and many state websites also have listings of all of their local health departments.  3) Find a Walk-in Clinic If your illness is not likely to be very severe or complicated, you may want to try a walk in clinic. These are popping up all over the country in pharmacies, drugstores, and shopping centers. They're usually staffed by nurse practitioners or physician assistants that have been trained to treat common illnesses and complaints. They're usually fairly quick and inexpensive. However, if you have serious medical issues or chronic medical problems, these are probably not your best option.  No Primary Care Doctor: - Call Health Connect at  [phone] - they can help you locate a primary care doctor that  accepts your insurance, provides certain services, etc. - Physician Referral Service-  [phone]  Chronic Pain Problems: Organization         Address  Phone   Notes  [facility]  [phone] Patients need to be referred by their primary care doctor.   Medication Assistance: Organization         Address  Phone   Notes  [facility] Medication [facility] [address] [place], [place] [address] [phone] --Must be a resident of [facility] -- Must have NO insurance coverage whatsoever (no Medicaid/ Medicare, etc.) -- The pt. MUST have a primary care doctor that directs their care regularly and follows them in the community   MedAssist  [phone]   [employer]  [phone]    Agencies that provide inexpensive medical care: Organization         Address  Phone   Notes  [facility]  [phone]   [facility]    [phone]   [facility] [address] [place], [place] [address] [phone]   [facility] [address] [place] [place] [phone]   Planned Parenthood    [phone]   [facility]    [phone]   Community  Health and [place]  [address], [place] Phone:  [phone], Fax:  [phone] Hours of Operation:  9 am - 6 pm, M-F.  Also accepts Medicaid/Medicare and self-pay.  [facility] for [address], [address], [place] Phone: [phone], Fax: [phone]. Hours of Operation:  8:30 am - 5:30 pm, M-F.  Also accepts Medicaid and self-pay.  [facility] [place] [address] [place] Phone: [phone]   [place], [place], [place] [phone], Ext. [phone] Mondays & Thursdays: 7-9 AM.  First 15 patients are seen on a first come, first serve basis.    Altus Providers:  Organization         Address  Phone   Notes  [facility] [address],  [place] [phone] Also accepts self-pay patients.  [facility] [address], [place]  [phone]   [place], [address], [place] [phone]   [facility] [address] [place] [phone]   Lucianne Lei [address], [place]   [phone] Only accepts [place] Access [place] patients after they have their name applied to their card.   Self-Pay (no insurance) in [facility]:  Organization         Address  Phone   Notes  Sickle Cell Patients, [facility] Internal Medicine [place] [phone]   [facility] Urgent Care [place] [phone]   [facility] [place]  [place], [place], [place] [phone]   [facility]/Dr. Osei-Bonsu  [address] [place] or [address], [place] [phone] Phone number for both [place] and [place] locations is the same.  Urgent Medical and [facility] [address], [place] [phone]   [facility] [address] [place] or [address] [phone]   [facility] [address] [place] [phone], phone; [phone], fax Sees patients 1st and 3rd Saturday of every month.  Must not qualify for public or private insurance (i.e. Medicaid, Medicare, [place] Health Choice, Veterans' Benefits)  Household income should be no more than 200% of the poverty level The clinic cannot treat you if you are pregnant or think you are pregnant  Sexually transmitted diseases are not treated at the clinic.    Dental Care: Organization         Address  Phone  Notes  [facility] [place] [phone] Accepts children up to age 69 who are enrolled in  [place] or [place]; pregnant women with a Medicaid card; and children who have applied for Medicaid or [place] Health Choice, but were declined, whose parents can pay a reduced fee at time of service.  [facility] Department of [facility]  [address], [place] [phone] Accepts children up to age 47 who are enrolled in [place] or [place]; pregnant women with a Medicaid card; and children who have applied for Medicaid or  Health Choice, but were declined, whose parents can pay a reduced fee at time of service.  [facility]  [phone]  [address], [place] [phone] Patients are seen by appointment only. Walk-ins are not accepted. [facility] will see patients 35 years of age and older. Monday - Tuesday (8am-5pm) Most Wednesdays (8:30-5pm) $30 per visit, cash only  [facility]  [address], [facility] [phone] Patients are seen by appointment only. Walk-ins are not accepted. [facility] will see patients 35 years of age and older. One Wednesday Evening (Monthly: Volunteer Based).  $30 per visit, cash only  [employer] [facility]  [phone] for adults; Children under age 614, call Graduate Pediatric Dentistry at [phone]. Children aged 314-14, please call [phone] to request a pediatric application.  Dental services are provided in all areas of dental care including fillings, crowns and bridges, complete and partial dentures, implants, gum treatment, root canals, and extractions. Preventive care is also provided. Treatment is provided to both adults and children. Patients are selected via a lottery and there is often a waiting list.   [facility] [address], [place]  [phone] [email or web address]   [facility] [address], [place], [place] [phone], Ext. [phone] Second and Fourth Thursday of each month, opens at 6:30  AM; Clinic ends at 9 AM.  Patients are seen on a first-come first-served basis, and a limited number are seen during each clinic.   [facility]  [address], [place], [place] [phone]   Eligibility Requirements You must have lived in [place], [place], or [place] counties for at least the last three months.   You cannot be eligible for state or federal sponsored National Cityhealthcare insurance, including CIGNAVeterans Administration, [place]icaid, or Harrah's EntertainmentMedicare.   You generally cannot be eligible for healthcare insurance through your employer.    How to apply: Eligibility screenings are held every Tuesday and Wednesday afternoon from 1:00 pm until 4:00 pm. You do not need an appointment for the interview!  [facility] [address], [place], [place] [phone]   [facility]  [phone]   [facility]  [phone]   [facility]  [phone]    Behavioral Health Resources in the Community: Intensive Outpatient Programs Organization         Address  Phone  Notes  [facility] [address], [place], [place] [phone]   [facility] Outpatient [address], [place], [place] [phone]   [facility]: [facility] Drug S[facility] [address], [place], [place]  [phone]   [facility] [address],  [place], [place] [phone] or [phone]   Substance Abuse Resources Organization         Address  Phone  Notes  Alcohol and Drug Services  [phone]   [facility]  [phone]   [facility]  [phone]   [facility]  [phone]   Residential & Outpatient Substance Abuse Program  [phone]   Psychological Services Organization         Address  Phone  Notes  [facility]  [phone]- [phone]   [facility]  [phone]   [facility] [address], [place] [phone] or  [phone]    Mobile Crisis Teams Organization         Address  Phone  Notes  [facility], Mobile Crisis Care Unit  [phone]   [facility]  [address] [place], [place] [phone]   Jasmine DecemberSharon  [place] [phone]    Self-Help/Support Groups Organization         Address  Phone             Notes  [facility] [place] - variety of support groups  [place] Call for more information  Narcotics Anonymous (NA), [facility] [address], [employer] [place]  2 meetings at this location   Special educational needs teacher         Address  Phone  Notes  ASAP Residential Treatment [place],    [place]  [phone]   [facility]  [address], [place] 696295, [place], [place]   [place], [place] [phone] Admissions: 8am-3pm M-F  Incentives Substance [place] [address],    [place], [place] [phone]   [facility] [address] [place], [place], [place]   [facility] [address]  [place], [place]   [facility] Programs - Intensive Outpatient [address] Krist[address], [place], [place]   [facility] ([place] [address].,  [place], [place] [phone] or [phone]   [facility] Services (RTS) [address] [place], [place] Accepts Medicaid  [facility] [place] [address],  [place] [phone] Substance Abuse/Addiction Treatment   [facility] Organization         Address  Phone  Notes  [facility]  [phone]   Domenic Schwab, PhD [address] Arlis Porta [place], [place]   [phone] or [phone]   [place], [place] [phone]   [facility] [address]  [place], [place] [phone] Insurance/Medicaid/sponsorship through [facility] Families [address] [place]                                    [place], [place] [phone] [place] [address], [place] [phone]    Dr. Adele Schilder  [phone]   [facility]. 1) [address], [place] 2) [place] 3)  [place] [address], [place] [phone]  [phone]   [place] [phone] or [phone] (After Hours)

## 2013-05-02 NOTE — ED Provider Notes (Signed)
CSN: [id number]     Arrival date & time 05/02/13  1009 History   First MD Initiated Contact with Patient 05/02/13 1130     Chief Complaint  Patient presents with  . Abdominal Pain    HPI  Brooke Lam is a 33 y.o. female with a PMH of ectopic pregnancy and pre-term labor who presents to the ED for evaluation of abdominal pain. History was provided by the patient.  Patient states that she developed gradual onset abdominal pain yesterday morning. Her abdominal pain is located in the lower abdomen diffusely without radiation. She describes an intermittent pressure type sensation. She reports increased pain with urination. She states that she took a Tylenol which improved her symptoms. She went to an urgent care clinic today where she was seen by Dr. Allyson Sabal who sent her to the emergency department for a CT scan and pelvic ultrasound. Patient also reports nausea however denies any vomiting. She had 3 episodes of diarrhea yesterday however this has resolved. No hematochezia. She denies any vaginal bleeding, vaginal discharge, or vaginal pain/sores. Patient is currently breast-feeding. She states she had a fever of 100.2 at home. She took a Tylenol which resolved her fever. Previous abdominal surgeries include cesarean section x 4 and abdominal hysterectomy. She denies any dizziness, lower back pain, chest pain, shortness of breath, headache, or leg edema.    Past Medical History  Diagnosis Date  . Ectopic pregnancy   . Preterm labor    Past Surgical History  Procedure Laterality Date  . Cesarian section    . Cesarean section      C/S x 4  . Abdominal hysterectomy     Family History  Problem Relation Age of Onset  . Anesthesia problems Neg Hx   . Hearing loss Neg Hx   . Other Neg Hx   . Diabetes Father   . Diabetes Paternal Grandmother    History  Substance Use Topics  . Smoking status: Former Smoker    Types: Cigarettes  . Smokeless tobacco: Never Used     Comment: quit 2005   . Alcohol Use: No   OB History   Grav Para Term Preterm Abortions TAB SAB Ect Mult Living   6 4 3 1 2  0 1 1 0 4     Review of Systems  Constitutional: Positive for fever (low grade) and appetite change. Negative for chills, diaphoresis, activity change and fatigue.  HENT: Negative for congestion, rhinorrhea and sore throat.   Respiratory: Negative for cough and shortness of breath.   Cardiovascular: Negative for chest pain and leg swelling.  Gastrointestinal: Positive for nausea, abdominal pain, diarrhea and abdominal distention. Negative for vomiting, constipation, blood in stool and rectal pain.  Genitourinary: Positive for dysuria. Negative for hematuria, decreased urine volume, vaginal bleeding, vaginal discharge, difficulty urinating, genital sores, vaginal pain and pelvic pain.  Musculoskeletal: Negative for back pain and myalgias.  Neurological: Negative for dizziness, weakness, light-headedness and headaches.    Allergies  Review of patient's allergies indicates no known allergies.  Home Medications   Current Outpatient Rx  Name  Route  Sig  Dispense  Refill  . acetaminophen (TYLENOL) 500 MG tablet   Oral   Take 500 mg by mouth every 6 (six) hours as needed for mild pain.          BP 120/68  Pulse 90  Temp(Src) 98.3 F (36.8 C) (Oral)  SpO2 100%  Filed Vitals:   05/02/13 1044 05/02/13 1902  BP: 120/68  123/60  Pulse: 90 87  Temp: 98.3 F (36.8 C) 98.1 F (36.7 C)  TempSrc: Oral Oral  Resp:  17  SpO2: 100% 99%    Physical Exam  Nursing note and vitals reviewed. Constitutional: She is oriented to person, place, and time. She appears well-developed and well-nourished. No distress.  HENT:  Head: Normocephalic and atraumatic.  Right Ear: External ear normal.  Left Ear: External ear normal.  Nose: Nose normal.  Mouth/Throat: Oropharynx is clear and moist. No oropharyngeal exudate.  Eyes: Conjunctivae are normal. Right eye exhibits no discharge. Left eye  exhibits no discharge.  Neck: Normal range of motion. Neck supple.  Cardiovascular: Normal rate, regular rhythm and normal heart sounds.  Exam reveals no gallop and no friction rub.   No murmur heard. Pulmonary/Chest: Effort normal and breath sounds normal. No respiratory distress. She has no wheezes. She has no rales. She exhibits no tenderness.  Abdominal: Soft. Bowel sounds are normal. She exhibits no distension and no mass. There is tenderness. There is no rebound and no guarding.  Diffuse lower abdominal tenderness.   Musculoskeletal: Normal range of motion. She exhibits no edema and no tenderness.  No CVA or flank tenderness bilaterally. No lumbar tenderness.  Neurological: She is alert and oriented to person, place, and time.  Skin: Skin is warm and dry. She is not diaphoretic.    ED Course  Procedures (including critical care time) Labs Review Labs Reviewed - No data to display Imaging Review No results found.  EKG Interpretation   None      Results for orders placed during the hospital encounter of 05/02/13  WET PREP, GENITAL      Result Value Range   Yeast Wet Prep HPF POC NONE SEEN  NONE SEEN   Trich, Wet Prep NONE SEEN  NONE SEEN   Clue Cells Wet Prep HPF POC FEW (*) NONE SEEN   WBC, Wet Prep HPF POC MANY (*) NONE SEEN  CBC WITH DIFFERENTIAL      Result Value Range   WBC 6.0  4.0 - 10.5 K/uL   RBC 4.35  3.87 - 5.11 MIL/uL   Hemoglobin 11.8 (*) 12.0 - 15.0 g/dL   HCT 35.1 (*) 36.0 - 46.0 %   MCV 80.7  78.0 - 100.0 fL   MCH 27.1  26.0 - 34.0 pg   MCHC 33.6  30.0 - 36.0 g/dL   RDW 13.1  11.5 - 15.5 %   Platelets 211  150 - 400 K/uL   Neutrophils Relative % 78 (*) 43 - 77 %   Neutro Abs 4.7  1.7 - 7.7 K/uL   Lymphocytes Relative 18  12 - 46 %   Lymphs Abs 1.1  0.7 - 4.0 K/uL   Monocytes Relative 4  3 - 12 %   Monocytes Absolute 0.2  0.1 - 1.0 K/uL   Eosinophils Relative 1  0 - 5 %   Eosinophils Absolute 0.1  0.0 - 0.7 K/uL   Basophils Relative 0  0 - 1 %    Basophils Absolute 0.0  0.0 - 0.1 K/uL  COMPREHENSIVE METABOLIC PANEL      Result Value Range   Sodium 138  137 - 147 mEq/L   Potassium 3.9  3.7 - 5.3 mEq/L   Chloride 103  96 - 112 mEq/L   CO2 24  19 - 32 mEq/L   Glucose, Bld 99  70 - 99 mg/dL   BUN 14  6 - 23 mg/dL  Creatinine, Ser 0.59  0.50 - 1.10 mg/dL   Calcium 8.9  8.4 - 10.5 mg/dL   Total Protein 7.7  6.0 - 8.3 g/dL   Albumin 3.8  3.5 - 5.2 g/dL   AST 15  0 - 37 U/L   ALT 7  0 - 35 U/L   Alkaline Phosphatase 88  39 - 117 U/L   Total Bilirubin 0.3  0.3 - 1.2 mg/dL   GFR calc non Af Amer >90  >90 mL/min   GFR calc Af Amer >90  >90 mL/min  LIPASE, BLOOD      Result Value Range   Lipase 41  11 - 59 U/L  URINALYSIS, ROUTINE W REFLEX MICROSCOPIC      Result Value Range   Color, Urine YELLOW  YELLOW   APPearance CLEAR  CLEAR   Specific Gravity, Urine 1.024  1.005 - 1.030   pH 7.0  5.0 - 8.0   Glucose, UA NEGATIVE  NEGATIVE mg/dL   Hgb urine dipstick SMALL (*) NEGATIVE   Bilirubin Urine NEGATIVE  NEGATIVE   Ketones, ur NEGATIVE  NEGATIVE mg/dL   Protein, ur NEGATIVE  NEGATIVE mg/dL   Urobilinogen, UA 1.0  0.0 - 1.0 mg/dL   Nitrite NEGATIVE  NEGATIVE   Leukocytes, UA NEGATIVE  NEGATIVE  URINE MICROSCOPIC-ADD ON      Result Value Range   Squamous Epithelial / LPF FEW (*) RARE   WBC, UA 0-2  <3 WBC/hpf   RBC / HPF 3-6  <3 RBC/hpf   Bacteria, UA FEW (*) RARE    CT Abdomen Pelvis W Contrast (Final result)  Result time: 05/02/13 16:43:31    Final result by Rad Results In Interface (05/02/13 16:43:31)    Narrative:   CLINICAL DATA: Abdominal pain.  EXAM: CT ABDOMEN AND PELVIS WITH CONTRAST  TECHNIQUE: Multidetector CT imaging of the abdomen and pelvis was performed using the standard protocol following bolus administration of intravenous contrast.  CONTRAST: 100 cc Omnipaque 300  COMPARISON: None  FINDINGS: The liver, spleen, pancreas, and adrenal glands appear unremarkable. No specific gallbladder or biliary  abnormality identified. No pathologic upper abdominal adenopathy is observed. Kidneys and proximal ureters unremarkable. The appendix measures 7 mm in diameter, and partly extends immediately adjacent to the right ovary.  There is a left ovarian hypodense lesion measuring 4.4 x 3.2 x 3.7 cm. Faint enhancement along the margins of this lesion.  There is some fullness of the soft tissues along the vaginal cuff. Urinary bladder unremarkable. No pathologic pelvic adenopathy is observed.  IMPRESSION: 1. The dominant finding is a cystic and potentially mildly rim enhancing lesion in the left ovary. This could represent a complex or hemorrhagic cyst. Entities such as tubo-ovarian abscess or mass are considered less likely. Dedicated pelvic sonography is recommended. 2. Fullness of soft tissues along the vaginal cuff can also be assessed at the time of ultrasound.   Electronically Signed By: Sherryl Barters M.D. On: 05/02/2013 16:43             [place] Art/Ven Flow Abd Pelv Doppler (Final result)  Result time: 05/02/13 18:30:51    Final result by Rad Results In Interface (05/02/13 18:30:51)    Narrative:   CLINICAL DATA: Left adnexal pain, fever, evaluate for torsion. Prior hysterectomy.  EXAM: TRANSABDOMINAL AND TRANSVAGINAL ULTRASOUND OF PELVIS  DOPPLER ULTRASOUND OF OVARIES  TECHNIQUE: Both transabdominal and transvaginal ultrasound examinations of the pelvis were performed. Transabdominal technique was performed for global imaging of the pelvis including uterus, ovaries,  adnexal regions, and pelvic cul-de-sac.  It was necessary to proceed with endovaginal exam following the transabdominal exam to better visualize the bilateral ovaries. Color and duplex Doppler ultrasound was utilized to evaluate blood flow to the ovaries.  COMPARISON: CT abdomen pelvis dated 05/02/2013  FINDINGS: Uterus  Surgically absent.  Right ovary  Measurements: 3.0 x 2.0 x 1.3 cm. Normal  appearance/no adnexal mass.  Left ovary  Measurements: 5.1 x 5.4 x 5.2 cm. 3.8 x 3.6 x 3.5 cm hemorrhagic cyst.  Pulsed Doppler evaluation of both ovaries demonstrates normal low-resistance arterial and venous waveforms.  Other findings  No free fluid.  IMPRESSION: 3.8 cm left ovarian hemorrhagic cyst, likely physiologic. If clinically warranted, a single follow-up pelvic ultrasound could be performed in 6-10 weeks.  No evidence of ovarian torsion.  Status post hysterectomy.   Electronically Signed By: Julian Hy M.D. On: 05/02/2013 18:30         MDM   Brooke Lam is a 33 y.o. female with a PMH of ectopic pregnancy and pre-term labor who presents to the ED for evaluation of abdominal pain.  Rechecks  1:40 PM = Pelvic exam performed at bedside with RN present. Mild amount of white thin discharge present in the vaginal vault. No bleeding. No vaginal tenderness or lesions. Pelvic exam unremarkable. Repeat exam revealed middle lower abdominal tenderness. L = R.   6:45 PM = Patient ambulating around the room. Anxious to leave to breast feed her baby.     Etiology of abdominal pain likely due to hemorrhagic ovarian cyst. Patient had CT which was concerning for ovarian cyst vs abscess vs mass. [place] was subsequently performed which revealed a left ovarian hemorrhagic cyst without evidence of torsion or abscess. Patient's pain was well controlled without intervention throughout her ED visit. Labs unremarkable. Patient afebrile and non-toxic in appearance. Patient instructed to follow-up with women's health and the [facility] wellness center. Return precautions, discharge instructions, and follow-up was discussed with the patient before discharge.     Discharge Medication List as of 05/02/2013  6:55 PM      Final impressions: 1. Hemorrhagic ovarian cyst       Mercy Moore PA-C   This patient was discussed with Dr. Shirlee Latch, PA-C 05/02/13 2028

## 2013-05-02 NOTE — ED Notes (Signed)
Pt. Is not noted in the room.

## 2013-05-02 NOTE — Progress Notes (Signed)
Patient ID: Brooke Lam, female   DOB: Feb 15, 1981, 33 y.o.   MRN: [id number]   CC:  HPI:  33 year old female here to establish care, she status post C-section and hysterectomy on 05/01/12 for placenta previa at [facility]. She started experiencing 10 on 10 bilateral lower quadrant pain yesterday. Pain in the right lower quadrant is worse than the left She also complains of nausea, had a fever off 102 at home, she took some Tylenol and currently is afebrile. She denies any dysuria, denies any abnormal vaginal discharge. She is currently breast-feeding every 2 hours. She also complains of chronic intermittent bloating with regular bowel movements. She is a nonsmoker nonalcoholic Family history reviewed and found to be negative   No Known Allergies Past Medical History  Diagnosis Date  . Ectopic pregnancy   . Preterm labor    Current Outpatient Prescriptions on File Prior to Visit  Medication Sig Dispense Refill  . ferrous sulfate 325 (65 FE) MG EC tablet Take 325 mg by mouth 3 (three) times daily with meals.      . Prenatal Vit-Fe Fumarate-FA (PRENATAL MULTIVITAMIN) TABS Take 1 tablet by mouth every morning.  30 tablet  8   No current facility-administered medications on file prior to visit.   Family History  Problem Relation Age of Onset  . Anesthesia problems Neg Hx   . Hearing loss Neg Hx   . Other Neg Hx   . Diabetes Father   . Diabetes Paternal Grandmother    History   Social History  . Marital Status: Single    Spouse Name: N/A    Number of Children: N/A  . Years of Education: N/A   Occupational History  . Not on file.   Social History Main Topics  . Smoking status: Former Smoker    Types: Cigarettes  . Smokeless tobacco: Never Used     Comment: quit 2005  . Alcohol Use: No  . Drug Use: No  . Sexual Activity: Yes    Birth Control/ Protection: None   Other Topics Concern  . Not on file   Social History Narrative  . No narrative on  file    Review of Systems  Constitutional: Negative for fever, chills, diaphoresis, activity change, appetite change and fatigue.  HENT: Negative for ear pain, nosebleeds, congestion, facial swelling, rhinorrhea, neck pain, neck stiffness and ear discharge.   Eyes: Negative for pain, discharge, redness, itching and visual disturbance.  Respiratory: Negative for cough, choking, chest tightness, shortness of breath, wheezing and stridor.   Cardiovascular: Negative for chest pain, palpitations and leg swelling.  Gastrointestinal: Negative for abdominal distention.  Genitourinary: As in history of present illness Musculoskeletal: Negative for back pain, joint swelling, arthralgias and gait problem.  Neurological: Negative for dizziness, tremors, seizures, syncope, facial asymmetry, speech difficulty, weakness, light-headedness, numbness and headaches.  Hematological: Negative for adenopathy. Does not bruise/bleed easily.  Psychiatric/Behavioral: Negative for hallucinations, behavioral problems, confusion, dysphoric mood, decreased concentration and agitation.    Objective:   Filed Vitals:   05/02/13 0936  BP: 113/72  Pulse: 80  Temp: 98.6 F (37 C)  Resp: 14    Physical Exam  Constitutional: Appears well-developed and well-nourished. No distress.  HENT: Normocephalic. External right and left ear normal. Oropharynx is clear and moist.  Eyes: Conjunctivae and EOM are normal. PERRLA, no scleral icterus.  Neck: Normal ROM. Neck supple. No JVD. No tracheal deviation. No thyromegaly.  CVS: RRR, S1/S2 +, no murmurs, no gallops, no carotid  bruit.  Pulmonary: Effort and breath sounds normal, no stridor, rhonchi, wheezes, rales.  Abdominal: Bilateral lower quadrant pain with significant abdominal distension, bilateral lower quadrant tenderness, negative for rebound or guarding.  abdomen soft  Musculoskeletal: Normal range of motion. No edema and no tenderness.  Lymphadenopathy: No  lymphadenopathy noted, cervical, inguinal. Neuro: Alert. Normal reflexes, muscle tone coordination. No cranial nerve deficit. Skin: Skin is warm and dry. No rash noted. Not diaphoretic. No erythema. No pallor.  Psychiatric: Normal mood and affect. Behavior, judgment, thought content normal.   Lab Results  Component Value Date   WBC 6.0 08/01/2012   HGB 12.4 08/01/2012   HCT 37.0 08/01/2012   MCV 78.1 08/01/2012   PLT 250 08/01/2012   Lab Results  Component Value Date   CREATININE 0.67 06/03/2011   BUN 15 06/03/2011   NA 134* 06/03/2011   K 3.7 06/03/2011   CL 101 06/03/2011   CO2 25 06/03/2011    No results found for this basename: HGBA1C   Lipid Panel  No results found for this basename: chol, trig, hdl, cholhdl, vldl, ldlcalc       Assessment and plan:   Patient Active Problem List   Diagnosis Date Noted  . S/P emergency cesarean hysterectomy 08/01/2012  . Dysuria 09/17/2011    Abdominal pain status post cesarean-hysterectomy   Given the severity of the abdominal pain as well as associated nausea and fever the patient probably needs to have stat abdominal imaging including a CT scan of the abdomen and pelvis, and a pelvic ultrasound to rule out ovarian torsion Nephrolithiasis Intra-abdominal abscess Bowel obstruction She will also need labs including CBC, CMP, UA, lipase Advised patient that we do not have the capability of obtaining this work up in the clinic here The patient is agreeable to go to the ED    Establish care Given the fact that the patient needs the above workup, no blood work is obtained in the clinic today        The patient was given clear instructions to go to ER or return to medical center if symptoms don't improve, worsen or new problems develop. The patient verbalized understanding. The patient was told to call to get any lab results if not heard anything in the next week.

## 2013-05-03 LAB — GC/CHLAMYDIA PROBE AMP
CT Probe RNA: NEGATIVE
GC PROBE AMP APTIMA: NEGATIVE

## 2013-05-22 ENCOUNTER — Other Ambulatory Visit: Payer: Self-pay | Admitting: *Deleted

## 2013-05-22 DIAGNOSIS — N83209 Unspecified ovarian cyst, unspecified side: Secondary | ICD-10-CM

## 2013-05-25 ENCOUNTER — Encounter: Payer: Self-pay | Admitting: Obstetrics and Gynecology

## 2013-06-12 ENCOUNTER — Ambulatory Visit (HOSPITAL_COMMUNITY)
Admission: RE | Admit: 2013-06-12 | Discharge: 2013-06-12 | Disposition: A | Discharge status: Home / Self Care | Payer: Medicaid Other | Source: Ambulatory Visit | Attending: Obstetrics and Gynecology | Admitting: Obstetrics and Gynecology

## 2013-06-12 DIAGNOSIS — N83209 Unspecified ovarian cyst, unspecified side: Secondary | ICD-10-CM | POA: Insufficient documentation

## 2013-06-12 DIAGNOSIS — Z90710 Acquired absence of both cervix and uterus: Secondary | ICD-10-CM | POA: Insufficient documentation

## 2013-06-14 ENCOUNTER — Telehealth: Payer: Self-pay | Admitting: General Practice

## 2013-06-14 NOTE — Telephone Encounter (Signed)
Message copied by Shelly Coss on Wed Jun 14, 2013  9:29 AM ------      Message from: CONSTANT, Vickii Chafe      Created: Tue Jun 13, 2013  8:41 PM       Please inform patient of resolution of cyst on her ovary. No need to follow up on 3/27 unless she has other issues to address.            Thanks            Clinical cytogeneticist ------

## 2013-06-14 NOTE — Telephone Encounter (Signed)
Called patient with [employer] for interpreter and informed patient of ultrasound results. Patient verbalized understanding. Told patient she doesn't need to come on Friday unless she is still having other issues. Patient stated she wanted to keep her appt for Friday because she still does have some pain in her lower abdomen. Patient had no further questions

## 2013-06-16 ENCOUNTER — Ambulatory Visit: Payer: Self-pay | Admitting: Obstetrics & Gynecology

## 2013-06-16 NOTE — Progress Notes (Signed)
Called pt and was unable to leave message due to voicemail box not being set up.

## 2013-06-22 ENCOUNTER — Ambulatory Visit: Payer: Self-pay

## 2013-07-11 ENCOUNTER — Ambulatory Visit: Payer: Self-pay

## 2014-01-22 ENCOUNTER — Encounter (HOSPITAL_COMMUNITY): Payer: Self-pay | Admitting: Emergency Medicine

## 2014-03-08 ENCOUNTER — Emergency Department (INDEPENDENT_AMBULATORY_CARE_PROVIDER_SITE_OTHER): Payer: Self-pay

## 2014-03-08 ENCOUNTER — Emergency Department (INDEPENDENT_AMBULATORY_CARE_PROVIDER_SITE_OTHER)
Admission: EM | Admit: 2014-03-08 | Discharge: 2014-03-08 | Disposition: A | Discharge status: Home / Self Care | Payer: Self-pay | Source: Home / Self Care | Attending: Family Medicine | Admitting: Family Medicine

## 2014-03-08 ENCOUNTER — Encounter (HOSPITAL_COMMUNITY): Payer: Self-pay | Admitting: Emergency Medicine

## 2014-03-08 DIAGNOSIS — S61419A Laceration without foreign body of unspecified hand, initial encounter: Secondary | ICD-10-CM

## 2014-03-08 DIAGNOSIS — M25529 Pain in unspecified elbow: Secondary | ICD-10-CM

## 2014-03-08 DIAGNOSIS — S61412A Laceration without foreign body of left hand, initial encounter: Secondary | ICD-10-CM

## 2014-03-08 MED ORDER — CEPHALEXIN 500 MG PO CAPS: 500.0000 mg | ORAL_CAPSULE | Freq: Three times a day (TID) | ORAL | Status: DC

## 2014-03-08 MED ORDER — LIDOCAINE HCL 2 % IJ SOLN
INTRAMUSCULAR | Status: AC
  Filled 2014-03-08: qty 20

## 2014-03-08 NOTE — ED Notes (Signed)
Pt states that she fell and broke her fall and cut her left hand on a shovel

## 2014-03-08 NOTE — Discharge Instructions (Signed)
Thank you for coming in today.  Cuidados de Risk manager - Adultos  (Laceration Care, Adult)  Una herida cortante es un corte o lesin que atraviesa todas las capas de la piel y el tejido que se encuentra debajo de la piel.  TRATAMIENTO  Algunas laceraciones no requieren sutura. Algunas no deben cerrarse debido a que puede aumentar el riesgo de infeccin. Es importante que consulte al mdico lo antes posible despus de recibir una lesin para minimizar el riesgo de infeccin y aumentar la posibilidad de que se cierre con xito.  Cuando se cierra adecuadamente, podrn indicarle analgsicos, si los necesita. La herida debe limpiarse para combatir la infeccin. El mdico usar puntos (suturas), [place], o tiras [place] para Equities trader. Estos elementos mantendrn unidos los bordes de la piel para que se cure ms rpidamente y para un mejor resultado cosmtico. Sin embargo, todas las heridas se curarn con una cicatriz. Una vez que la herida se haya curado, las cicatrices pueden minimizarse cubriendo la herida con pantalla solar durante el da por un lapso se 1 ao.  INSTRUCCIONES PARA EL CUIDADO EN EL HOGAR  Si tiene puntos o grapas:   Mantenga la herida limpia y [place].  Si tiene un (vendaje) cmbielo al menos una vez al da. Cmbielo si se moja o se ensucia, o segn las indicaciones del [place] veces por da con agua y [place]. Enjuguelo con agua para quitar todo el [place]. Seque dando palmaditas con una toalla limpia y seca.  Despus de limpiar, aplique una delgada capa de una crema con antibitico segn las indicaciones del mdico. Esto le ayudar a prevenir las infecciones y a Product/process development scientist que el vendaje se Designer, fashion/clothing.  Puede ducharse despus de las primeras 24 horas. No remoje la herida en agua hasta que le hayan quitado los puntos.  Solo tome medicamentos que se pueden comprar sin receta o recetados para Conservation officer, historic buildings, Tree surgeon o fiebre, como le indica el  mdico.  Concurra para que le retiren los puntos o las grapas cuando el mdico le indique. En caso que tenga tiras [id number]:   Mantenga la herida limpia y seca.  No deje que las tiras se mojen. Puede darse un bao cuidando de [employer] herida seca.  Si se moja, squela dando palmaditas con una toalla limpia.  Las tiras caern por s mismas. Puede recortar las tiras a medida que la herida se [place]. No quite las tiras que estn pegadas a la herida. Ellas se caern cuando sea el momento. En caso que le hayan [place].   Podr mojara momentneamente la herida en la ducha o el bao. No frote ni sumerja la herida. No practique natacin. Evite transpirar con abundancia hasta que el [place] se haya cado. Despus de ducharse o darse un bao, seque el corte dando palmaditas con una toalla limpia.  No aplique medicamentos lquidos, en crema o ungentos mientras el [employer] est en su lugar. Podr aflojarlo antes de que la herida se cure.  Si tiene un vendaje, tenga cuidado de no aplicar cinta adhesiva directamente [employer]. Esto puede hacer que el [place] se caiga antes de que la herida se haya curado.  Evite la exposicin prolongada a la luz del sol o a la Event organiser en [employer] se Network engineer. La exposicin a los Radiographer, therapeutic la Radio broadcast assistant.  El [employer] la piel durante 5 a 10 das y Loss adjuster, chartered. No  quite la pelcula de [place]. Deber aplicarse la vacuna contra el ttanos si:  No recuerda cundo se coloc la vacuna la ltima vez.  Nunca recibi esta vacuna. Si le han aplicado la vacuna contra el ttanos, el brazo podr hincharse, enrojecer y sentirse caliente al tacto. Esto es frecuente y no es un problema. Si usted necesita aplicarse la vacuna y se niega a recibirla, corre riesgo de contraer ttanos. sta es una enfermedad grave.  SOLICITE ATENCIN MDICA SI:   Presenta  enrojecimiento, hinchazn o aumento del dolor en la herida.  Hay rayas rojas que salen de la herida.  Observa un lquido blanco amarillento (pus) en la herida.  Tiene fiebre.  Advierte un olor ftido que proviene de la herida o del vendaje.  La herida se abre luego de que le han extrado las suturas.  Nota que en la herida hay algn cuerpo extrao como un trozo de [place] o vidrio.  La herida est en su mano o pie y observa que no puede mover correctamente los dedos. SOLICITE ATENCIN MDICA DE INMEDIATO SI:   El dolor no se alivia con los [employer].  Hay una zona muy hinchada alrededor de la herida que le causa dolor y adormecimiento, o advierte un cambio en el color en el brazo, la mano, la pierna o el pie.  La herida se abre y sangra nuevamente.  Siente que el adormecimiento, la debilidad o la prdida de la funcin de la articulacin que rodea la herida [place].  Palpa ndulos dolorosos cerca de la herida o bajo la piel en cualquier zona del cuerpo. ASEGRESE DE QUE:   Comprende estas instrucciones.  Controlar su enfermedad.  Solicitar ayuda de inmediato si no mejora o si empeora. Document Released: 03/09/2005 Document Revised: 06/01/2011 [facility] Patient Information 2015 [place]. This information is not intended to replace advice given to you by your health care provider. Make sure you discuss any questions you have with your health care provider.   Contusin (Contusion) Una contusin es un hematoma profundo. Las contusiones son el resultado de una lesin que causa sangrado debajo de la piel. La zona de la contusin puede ponerse [place], [place] o [place]. Las lesiones menores causarn contusiones sin Social research officer, government, Armed forces training and education officer las ms graves pueden presentar dolor e inflamacin durante un par de semanas.  CAUSAS  Generalmente, una contusin se debe a un golpe, un traumatismo o una fuerza directa en una zona del cuerpo. SNTOMAS   Hinchazn y enrojecimiento en la zona de  la lesin.  Hematomas en la zona de la lesin.  Dolor con la palpacin y sensibilidad en la zona de la lesin.  Dolor. DIAGNSTICO  Se puede establecer el diagnstico al hacer una historia clnica y un examen fsico. Letitia Caul vez sea necesario hacer una radiografa, una tomografa computarizada o una resonancia magntica para determinar si hay lesiones asociadas, como fracturas. TRATAMIENTO  El tratamiento especfico depender de la zona del cuerpo donde se produjo la lesin. En general, el mejor tratamiento para una contusin es el reposo, la aplicacin de hielo, la elevacin de la zona y la aplicacin de compresas fras en la zona de la lesin. Para calmar el dolor tambin podrn recomendarle medicamentos de venta libre. Pregntele al mdico cul es el mejor tratamiento para su contusin. INSTRUCCIONES PARA EL CUIDADO EN EL HOGAR   Aplique hielo sobre la zona lesionada.  Ponga el hielo en una bolsa plstica.  Colquese una toalla entre la piel y la bolsa de hielo.  Deje el hielo durante  15 a 24minutos, 3 a 4veces por da, o [place] los medicamentos de venta libre o recetados para Glass blower/designer, el malestar o la [place], segn se lo indique el mdico. El mdico podr indicarle que evite tomar antiinflamatorios (aspirina, ibuprofeno y naproxeno) durante 1 horas ya que estos medicamentos pueden aumentar los hematomas.  Mantenga la zona de la lesin en reposo.  Si es posible, eleve la zona de la lesin para reducir la hinchazn. SOLICITE ATENCIN MDICA DE INMEDIATO SI:   El hematoma o la hinchazn aumentan.  Siente dolor que [place].  La hinchazn o el dolor no se [employer]. ASEGRESE DE QUE:   Comprende estas instrucciones.  Controlar su afeccin.  Recibir ayuda de inmediato si no mejora o si empeora. Document Released: 12/17/2004 Document Revised: 03/14/2013 [facility] Patient Information 2015 [place]. This  information is not intended to replace advice given to you by your health care provider. Make sure you discuss any questions you have with your health care provider.

## 2014-03-08 NOTE — ED Provider Notes (Signed)
Brooke Lam is a 33 y.o. female who presents to Urgent Care today for left hand laceration. Patient slipped and fell today . She fell to the ground she attempted to grab a shovel and suffered a laceration to the palm of her left hand at the ulnar aspect. She additionally hit the lateral aspect of her arm on the railing and she fell. She denies any radiating pain weakness or numbness. She feels well otherwise. Her last tetanus vaccination was a year ago.   Past Medical History  Diagnosis Date  . Ectopic pregnancy   . Preterm labor    Past Surgical History  Procedure Laterality Date  . Cesarian section    . Cesarean section      C/S x 4  . Abdominal hysterectomy     History  Substance Use Topics  . Smoking status: Former Smoker    Types: Cigarettes  . Smokeless tobacco: Never Used     Comment: quit 2005  . Alcohol Use: No   ROS as above Medications: No current facility-administered medications for this encounter.   Current Outpatient Prescriptions  Medication Sig Dispense Refill  . acetaminophen (TYLENOL) 500 MG tablet Take 500 mg by mouth every 6 (six) hours as needed for mild pain.    . cephALEXin (KEFLEX) 500 MG capsule Take 1 capsule (500 mg total) by mouth 3 (three) times daily. spanish 21 capsule 0   No Known Allergies   Exam:  BP 121/83 mmHg  Pulse 94  Temp(Src) 98.6 F (37 C) (Oral)  Resp 16  SpO2 97%  LMP 10/08/2011 Gen: Well NAD Left shoulder: Normal-appearing nontender. Left elbow minimally tender at the radial head. Range of motion limited to full extension by 5 and supination by 5. Otherwise normal. Left hand: Deep laceration approximately 3 cm at the ulnar palmar hand. The laceration extends to the dermis but does not involve tendons. Flexion and extension strength is intact in the hand. Sensation and capillary refill are intact distally.  Left hand laceration repair:  Consent obtained and timeout performed. Area cleaned with clean water.   3 mL of lidocaine without epinephrine injected achieving good anesthesia Wound was copiously irrigated with sterile saline. The skin was prepped with Betadine and draped in the usual sterile fashion. The wound was closely inspected. 4 horizontal mattress sutures were used to close the wound using 4-0 Prolene.  Patient tolerated the procedure well A dressing was applied.   No results found for this or any previous visit (from the past 24 hour(s)). Dg Elbow Complete Left  03/08/2014   CLINICAL DATA:  [place] from porch  EXAM: LEFT ELBOW - COMPLETE 3+ VIEW  COMPARISON:  None.  FINDINGS: There is no evidence of fracture, dislocation, or joint effusion. There is no evidence of arthropathy or other focal bone abnormality. Soft tissues are unremarkable.  IMPRESSION: Negative.   Electronically Signed   By: Kerby Moors M.D.   On: 03/08/2014 15:51   Dg Hand Complete Left  03/08/2014   CLINICAL DATA:  Per pt: limited english. Patient fell off the porch earlier today, trying to break the fall, caught a shovel with the left hand. There is an open wound palmer. Patient pointed to the AP and medial/ulnar left hand. The wound extends to the medial aspect of the left hand. Patient is not a diabetic. No prior injury to the left hand.  EXAM: LEFT HAND - COMPLETE 3+ VIEW  COMPARISON:  None.  FINDINGS: Mild ulnar minus variance.  There is  no acute fracture dislocation.  IMPRESSION: No acute findings.   Electronically Signed   By: Marin Olp M.D.   On: 03/08/2014 15:48    Assessment and Plan: 33 y.o. female with left hand laceration and left elbow contusion. Patient was placed into a sling as needed for comfort and given Keflex antibiotics. To return to clinic for one week for suture removal.  Return sooner as needed. Tetanus up-to-date.  Discussed warning signs or symptoms. Please see discharge instructions. Patient expresses understanding.     Gregor Hams, MD 03/08/14 [phone]

## 2014-03-17 ENCOUNTER — Emergency Department (INDEPENDENT_AMBULATORY_CARE_PROVIDER_SITE_OTHER)
Admission: EM | Admit: 2014-03-17 | Discharge: 2014-03-17 | Disposition: A | Discharge status: Home / Self Care | Payer: Self-pay | Source: Home / Self Care | Attending: Family Medicine | Admitting: Family Medicine

## 2014-03-17 ENCOUNTER — Encounter (HOSPITAL_COMMUNITY): Payer: Self-pay | Admitting: *Deleted

## 2014-03-17 DIAGNOSIS — Z4802 Encounter for removal of sutures: Secondary | ICD-10-CM

## 2014-03-17 NOTE — Discharge Instructions (Signed)
Thank you for coming in today.   Minimizacin de las cicatrices  (Scar Minimization) Cada vez que se pase por una ciruga y se realice un corte en la piel, o le extirpan algo de la piel (lunares, cncer de piel, quiste), tendr Cari Caraway. Aunque las cicatrices son inevitables despus de la ciruga, hay maneras de minimizar su apariencia.  Es importante seguir todas las instrucciones que reciba del mdico sobre el cuidado de la herida. El modo en que se cura la herida influir en la apariencia de la Radio broadcast assistant. Si usted no sigue las instrucciones [employer] heridas, puede haber complicaciones, como infecciones. Las instrucciones pare el cuidado de las heridas incluyen la higiene de la herida y [place], para que no se forme Serita Grammes. En algunas personas se forman cicatrices que son elevadas y abultadas (hipertrficas)o ms grandes que la herida inicial (queloides).  INSTRUCCIONES PARA EL CUIDADO EN EL HOGAR   Siga las instrucciones de cuidado de las heridas, segn las indicaciones.  Mantenga la herida limpia, lavndola con agua y [place].  Mantenga la herida hmeda con crema con antibitico o vaselina hasta que est completamente curada. Humedezca dos veces al da durante aproximadamente 2 semanas.  Concurra para que [employer] puntos (suturas) en el momento programado.  Evite tocar o manipular la herida a menos que sea necesario. Lvese bien las manos antes y despus de tocar la herida.  Siga todas las restricciones, tales como lmites en el ejercicio o el [place]. Esto depende de dnde se encuentre la cicatriz.  Mantenga la cicatriz protegida de la exposicin al sol. Cubra la cicatriz con la proteccin solar / bloqueador solar con SPF 30 o superior.  Frote suavemente la herida con un movimiento circular para ayudar a Ship broker apariencia de la Radio broadcast assistant. Haga esto slo despus de que la herida se haya cerrado y todas las suturas se hayan eliminado.  Para las  cicatrices hipertrficas o queloides, hay varias maneras de tratar y minimizar su aparicin. Los mtodos incluyen la terapia de compresin, los corticoides intralesionales, la terapia con lser o la [place]. Estos mtodos los Cabin crew mdico. Recuerde que la cicatriz puede aparecer ms clara o ms oscura que el color de la piel normal. Esta diferencia en el color debe igualarse con el tiempo.  SOLICITE ATENCIN MDICA SI:   Jaclynn Guarneri.  Aparecen signos de infeccin como dolor, enrojecimiento, pus y calor.  Tiene preguntas o preocupaciones. Document Released: 02/26/2011 Document Revised: 06/01/2011 [facility] Patient Information 2015 [place]. This information is not intended to replace advice given to you by your health care provider. Make sure you discuss any questions you have with your health care provider.

## 2014-03-17 NOTE — ED Notes (Signed)
Presents for suture removal left hand.  No S/S infection.

## 2014-03-17 NOTE — ED Provider Notes (Signed)
Brooke Lam is a 33 y.o. female who presents to Urgent Care today for left hand laceration. Patient is here for suture removal. She was seen 10 days ago and had 4 horizontal mattress sutures placed. She feels well no fevers or chills or pain or discharge. Hand motion is intact.   Past Medical History  Diagnosis Date  . Ectopic pregnancy   . Preterm labor    Past Surgical History  Procedure Laterality Date  . Cesarian section    . Cesarean section      C/S x 4  . Abdominal hysterectomy     History  Substance Use Topics  . Smoking status: Former Smoker    Types: Cigarettes  . Smokeless tobacco: Never Used     Comment: quit 2005  . Alcohol Use: No   ROS as above Medications: No current facility-administered medications for this encounter.   Current Outpatient Prescriptions  Medication Sig Dispense Refill  . acetaminophen (TYLENOL) 500 MG tablet Take 500 mg by mouth every 6 (six) hours as needed for mild pain.    . cephALEXin (KEFLEX) 500 MG capsule Take 1 capsule (500 mg total) by mouth 3 (three) times daily. spanish 21 capsule 0   No Known Allergies   Exam:  BP 118/83 mmHg  Pulse 74  Temp(Src) 98.7 F (37.1 C) (Oral)  Resp 16  SpO2 97%  LMP 10/08/2011 Gen: Well NAD Left hand on her laceration well appearing. Nontender. Normal hand motion. No discharge.  No results found for this or any previous visit (from the past 24 hour(s)). No results found.  Assessment and Plan: 33 y.o. female with sutures removed. Return to clinic as needed. Scar minimization technique reviewed.  Discussed warning signs or symptoms. Please see discharge instructions. Patient expresses understanding.     Gregor Hams, MD 03/17/14 [phone]

## 2014-05-24 ENCOUNTER — Emergency Department (INDEPENDENT_AMBULATORY_CARE_PROVIDER_SITE_OTHER)
Admission: EM | Admit: 2014-05-24 | Discharge: 2014-05-24 | Disposition: A | Discharge status: Home / Self Care | Payer: Self-pay | Source: Home / Self Care | Attending: Emergency Medicine | Admitting: Emergency Medicine

## 2014-05-24 ENCOUNTER — Encounter (HOSPITAL_COMMUNITY): Payer: Self-pay | Admitting: Emergency Medicine

## 2014-05-24 DIAGNOSIS — N3001 Acute cystitis with hematuria: Secondary | ICD-10-CM

## 2014-05-24 LAB — POCT URINALYSIS DIP (DEVICE)
Bilirubin Urine: NEGATIVE
GLUCOSE, UA: NEGATIVE mg/dL
Ketones, ur: 40 mg/dL — AB
Nitrite: NEGATIVE
Protein, ur: 100 mg/dL — AB
Specific Gravity, Urine: 1.015 (ref 1.005–1.030)
Urobilinogen, UA: 0.2 mg/dL (ref 0.0–1.0)
pH: 6 (ref 5.0–8.0)

## 2014-05-24 LAB — POCT PREGNANCY, URINE: Preg Test, Ur: NEGATIVE

## 2014-05-24 MED ORDER — CEPHALEXIN 500 MG PO CAPS: 500.0000 mg | ORAL_CAPSULE | Freq: Three times a day (TID) | ORAL | Status: DC

## 2014-05-24 MED ORDER — PHENAZOPYRIDINE HCL 200 MG PO TABS: 200.0000 mg | ORAL_TABLET | Freq: Three times a day (TID) | ORAL | Status: DC | PRN

## 2014-05-24 NOTE — ED Notes (Signed)
C/o uti: pain with urination, low abdominal pain, feels need to urinate frequently

## 2014-05-24 NOTE — Discharge Instructions (Signed)
Infección urinaria  °(Urinary Tract Infection) ° La infección urinaria puede ocurrir en cualquier lugar del tracto urinario. El tracto urinario es un sistema de drenaje del cuerpo por el que se eliminan los desechos y el exceso de agua. El tracto urinario está formado por dos riñones, dos uréteres, la vejiga y la uretra. Los riñones son órganos que tienen forma de frijol. Cada riñón tiene aproximadamente el tamaño del puño. Están situados debajo de las costillas, uno a cada lado de la columna vertebral °CAUSAS  °La causa de la infección son los microbios, que son organismos microscópicos, que incluyen hongos, virus, y bacterias. Estos organismos son tan pequeños que sólo pueden verse a través del microscopio. Las bacterias son los microorganismos que más comúnmente causan infecciones urinarias.  °SÍNTOMAS  °Los síntomas pueden variar según la edad y el sexo del paciente y por la ubicación de la infección. Los síntomas en las mujeres jóvenes incluyen la necesidad frecuente e intensa de orinar y una sensación dolorosa de ardor en la vejiga o en la uretra durante la micción. Las mujeres y los hombres mayores podrán sentir cansancio, temblores y debilidad y sentir dolores musculares y dolor abdominal. Si tiene fiebre, puede significar que la infección está en los riñones. Otros síntomas son dolor en la espalda o en los lados debajo de las costillas, náuseas y vómitos.  °DIAGNÓSTICO  °Para diagnosticar una infección urinaria, el médico le preguntará acerca de sus síntomas. También le solicitará una muestra de orina. La muestra de orina se analiza para detectar bacterias y glóbulos blancos de la sangre. Los glóbulos blancos se forman en el organismo para ayudar a combatir las infecciones.  °TRATAMIENTO  °Por lo general, las infecciones urinarias pueden tratarse con medicamentos. Debido a que la mayoría de las infecciones son causadas por bacterias, por lo general pueden tratarse con antibióticos. La elección del  antibiótico y la duración del tratamiento dependerá de sus síntomas y el tipo de bacteria causante de la infección.  °INSTRUCCIONES PARA EL CUIDADO EN EL HOGAR  °· Si le recetaron antibióticos, tómelos exactamente como su médico le indique. Termine el medicamento aunque se sienta mejor después de haber tomado sólo algunos. °· Beba gran cantidad de líquido para mantener la orina de tono claro o color amarillo pálido. °· Evite la cafeína, el té y las bebidas gaseosas. Estas sustancias irritan la vejiga. °· Vaciar la vejiga con frecuencia. Evite retener la orina durante largos períodos. °· Vacíe la vejiga antes y después de tener relaciones sexuales. °· Después de mover el intestino, las mujeres deben higienizarse la región perineal desde adelante hacia atrás. Use sólo un papel tissue por vez. °SOLICITE ATENCIÓN MÉDICA SI:  °· Siente dolor en la espalda. °· Le sube la fiebre. °· Los síntomas no mejoran luego de 3 días. °SOLICITE ATENCIÓN MÉDICA DE INMEDIATO SI:  °· Siente dolor intenso en la espalda o en la zona inferior del abdomen. °· Comienza a sentir escalofríos. °· Tiene náuseas o vómitos. °· Tiene una sensación continua de quemazón o molestias al orinar. °ASEGÚRESE DE QUE:  °· Comprende estas instrucciones. °· Controlará su enfermedad. °· Solicitará ayuda de inmediato si no mejora o empeora. °Document Released: 12/17/2004 Document Revised: 12/02/2011 °ExitCare® Patient Information ©2015 [employer]. This information is not intended to replace advice given to you by your health care provider. Make sure you discuss any questions you have with your health care provider. ° °

## 2014-05-24 NOTE — ED Provider Notes (Signed)
Chief Complaint   Urinary Tract Infection   History of Present Illness   Brooke Lam is a 34 year old female who's had a history since this morning of urinary frequency, urgency, dysuria, hematuria, and suprapubic pain. She denies any back pain, nausea, vomiting, fever, chills, or GYN complaints. She has something similar about 2 years ago when she was pregnant. Subsequently she's had a hysterectomy.  Review of Systems   Other than as noted above, the patient denies any of the following symptoms: General:  No fevers or chills. GI:  No abdominal pain, back pain, nausea, or vomiting. GU:  No hematuria or incontinence. GYN:  No discharge, itching, vulvar pain or lesions, pelvic pain, or abnormal vaginal bleeding.  [place]   Past medical history, family history, social history, meds, and allergies were reviewed.    Physical Examination     Vital signs:  BP 116/80 mmHg  Pulse 107  Temp(Src) 99.1 F (37.3 C) (Oral)  Resp 16  SpO2 97%  LMP 10/08/2011 Gen:  Alert, oriented, in no distress. Lungs:  Clear to auscultation, no wheezes, rales or rhonchi. Heart:  Regular rhythm, no gallop or murmer. Abdomen:  Flat and soft. There was slight suprapubic pain to palpation.  No guarding, or rebound.  No hepato-splenomegaly or mass.  Bowel sounds were normally active.  No hernia. Back:  No CVA tenderness.  Skin:  Clear, warm and dry.  Labs   Results for orders placed or performed during the hospital encounter of 05/24/14  POCT urinalysis dip (device)  Result Value Ref Range   Glucose, UA NEGATIVE NEGATIVE mg/dL   Bilirubin Urine NEGATIVE NEGATIVE   Ketones, ur 40 (A) NEGATIVE mg/dL   Specific Gravity, Urine 1.015 1.005 - 1.030   Hgb urine dipstick LARGE (A) NEGATIVE   pH 6.0 5.0 - 8.0   Protein, ur 100 (A) NEGATIVE mg/dL   Urobilinogen, UA 0.2 0.0 - 1.0 mg/dL   Nitrite NEGATIVE NEGATIVE   Leukocytes, UA SMALL (A) NEGATIVE  Pregnancy, urine POC  Result Value Ref Range     Preg Test, Ur NEGATIVE NEGATIVE     A urine culture was obtained.  Results are pending at this time and we will call about any positive results.  Assessment   The encounter diagnosis was Acute cystitis with hematuria.   No evidence of pyelonephritis.   Plan   1.  Meds:  The following meds were prescribed:   Discharge Medication List as of 05/24/2014  6:11 PM    START taking these medications   Details  !! cephALEXin (KEFLEX) 500 MG capsule Take 1 capsule (500 mg total) by mouth 3 (three) times daily., Starting 05/24/2014, Until Discontinued, Normal    phenazopyridine (PYRIDIUM) 200 MG tablet Take 1 tablet (200 mg total) by mouth 3 (three) times daily as needed for pain., Starting 05/24/2014, Until Discontinued, Normal     !! - Potential duplicate medications found. Please discuss with provider.      2.  Patient Education/Counseling:  The patient was given appropriate handouts, self care instructions, and instructed in symptomatic relief. The patient was told to avoid intercourse for 10 days, get extra fluids, and return for a follow up with her primary care doctor at the completion of treatment for a repeat UA and culture.    3.  Follow up:  The patient was told to follow up here if no better in 3 to 4 days, or sooner if becoming worse in any way, and given some red flag  symptoms such as fever, persistent vomiting, or severe flank or abdominal pain which would prompt immediate return.  She was also referred to the women's clinic in [place] for evaluation of painful intercourse and bleeding with intercourse.     Harden Mo, MD 05/24/14 [phone]

## 2014-05-27 LAB — URINE CULTURE: Colony Count: 15000

## 2014-05-28 NOTE — Progress Notes (Signed)
Quick Note:  Results are abnormal as noted, but have been adequately treated. No further action necessary. The patient was adequately treated with cephalexin. ______

## 2014-05-28 NOTE — ED Notes (Signed)
Urine culture: 15,000 colonies E. Coli.  Pt. adequately treated with Keflex. Brooke Lam 05/28/2014

## 2014-07-04 ENCOUNTER — Ambulatory Visit: Payer: Self-pay | Admitting: Obstetrics and Gynecology

## 2014-07-06 ENCOUNTER — Ambulatory Visit: Payer: Self-pay | Attending: Internal Medicine | Admitting: Internal Medicine

## 2014-07-06 ENCOUNTER — Encounter: Payer: Self-pay | Admitting: Internal Medicine

## 2014-07-06 VITALS — BP 121/82 | HR 83 | Temp 98.7°F | Resp 16 | Ht 63.0 in | Wt 173.0 lb

## 2014-07-06 DIAGNOSIS — Z87891 Personal history of nicotine dependence: Secondary | ICD-10-CM | POA: Insufficient documentation

## 2014-07-06 DIAGNOSIS — M791 Myalgia, unspecified site: Secondary | ICD-10-CM

## 2014-07-06 DIAGNOSIS — M79622 Pain in left upper arm: Secondary | ICD-10-CM | POA: Insufficient documentation

## 2014-07-06 DIAGNOSIS — R5383 Other fatigue: Secondary | ICD-10-CM

## 2014-07-06 DIAGNOSIS — Z833 Family history of diabetes mellitus: Secondary | ICD-10-CM

## 2014-07-06 DIAGNOSIS — Z90710 Acquired absence of both cervix and uterus: Secondary | ICD-10-CM | POA: Insufficient documentation

## 2014-07-06 LAB — CBC WITH DIFFERENTIAL/PLATELET
BASOS ABS: 0 10*3/uL (ref 0.0–0.1)
Basophils Relative: 0 % (ref 0–1)
Eosinophils Absolute: 0.1 10*3/uL (ref 0.0–0.7)
Eosinophils Relative: 1 % (ref 0–5)
HEMATOCRIT: 37.8 % (ref 36.0–46.0)
HEMOGLOBIN: 12.4 g/dL (ref 12.0–15.0)
LYMPHS ABS: 1.6 10*3/uL (ref 0.7–4.0)
Lymphocytes Relative: 26 % (ref 12–46)
MCH: 26.8 pg (ref 26.0–34.0)
MCHC: 32.8 g/dL (ref 30.0–36.0)
MCV: 81.6 fL (ref 78.0–100.0)
MONOS PCT: 3 % (ref 3–12)
MPV: 10.7 fL (ref 8.6–12.4)
Monocytes Absolute: 0.2 10*3/uL (ref 0.1–1.0)
NEUTROS PCT: 70 % (ref 43–77)
Neutro Abs: 4.3 10*3/uL (ref 1.7–7.7)
Platelets: 244 10*3/uL (ref 150–400)
RBC: 4.63 MIL/uL (ref 3.87–5.11)
RDW: 14.3 % (ref 11.5–15.5)
WBC: 6.2 10*3/uL (ref 4.0–10.5)

## 2014-07-06 LAB — COMPLETE METABOLIC PANEL WITH GFR
ALBUMIN: 4.4 g/dL (ref 3.5–5.2)
ALT: 9 U/L (ref 0–35)
AST: 16 U/L (ref 0–37)
Alkaline Phosphatase: 69 U/L (ref 39–117)
BUN: 15 mg/dL (ref 6–23)
CO2: 24 mEq/L (ref 19–32)
Calcium: 9.6 mg/dL (ref 8.4–10.5)
Chloride: 105 mEq/L (ref 96–112)
Creat: 0.71 mg/dL (ref 0.50–1.10)
GFR, Est African American: >89 mL/min
GFR, Est Non African American: >89 mL/min
Glucose, Bld: 103 mg/dL — ABNORMAL HIGH (ref 70–99)
POTASSIUM: 4.9 meq/L (ref 3.5–5.3)
SODIUM: 139 meq/L (ref 135–145)
TOTAL PROTEIN: 7.3 g/dL (ref 6.0–8.3)
Total Bilirubin: 0.3 mg/dL (ref 0.2–1.2)

## 2014-07-06 LAB — HEMOGLOBIN A1C
Hgb A1c MFr Bld: 6 % — ABNORMAL HIGH (ref <5.7)
MEAN PLASMA GLUCOSE: 126 mg/dL — AB (ref <117)

## 2014-07-06 LAB — VITAMIN B12: VITAMIN B 12: 979 pg/mL — AB (ref 211–911)

## 2014-07-06 LAB — T4, FREE: Free T4: 1.05 ng/dL (ref 0.80–1.80)

## 2014-07-06 LAB — RHEUMATOID FACTOR: Rhuematoid fact SerPl-aCnc: <10 IU/mL (ref <=14)

## 2014-07-06 LAB — TSH: TSH: 1.069 u[IU]/mL (ref 0.350–4.500)

## 2014-07-06 NOTE — Progress Notes (Signed)
Pt is here to establish care. For 2 months pt has pain in both arms. Pt gets pain in her head and back. Pt states that she get very tired. Interpreter [phone]

## 2014-07-06 NOTE — Patient Instructions (Signed)
Fatiga (Fatigue) Fatiga es la sensacin de cansancio, falta de energa, falta de motivacin, o sensacin permanente de Health and safety inspector. Si descansa lo suficiente, se alimenta bien y reduce las situaciones de estrs, Warehouse manager. Consulte con su mdico si esto persiste. La naturaleza de su fatiga indicar a su mdico cul es la causa. El tratamiento se implementa segn cul sea la causa.  CAUSAS Hay muchas causas de fatiga. La mayora de las veces puede hallarse en uno o ms de sus hbitos o rutinas. [place] causas de fatiga pueden incluirse en una o ms de tres reas generales: Ellas son: Problemas en el estilo de vida  Trastornos del sueo.  Trabajar demasiado.  Agotamiento fsico.  Hbitos no saludables.  Malos hbitos alimenticios o trastornos de alimentacin.  Uso de alcohol y/o droga.  Falta de nutricin adecuada (desnutricin). Problemas psiclogos  Problemas de estrs y/o ansiedad.  Depresin.  Duelo.  Aburrimiento. Trastornos o problemas mdicos  Anemia.  Embarazo.  Problemas en la glndula tiroides.  Recuperacin de [employer].  Dolores continuos.  Enfisema o asma no controladas adecuadamente.  Problemas alrgicos.  Diabetes.  Infecciones (como la mononucleosis).  Obesidad.  Trastornos del sueo, como apnea del sueo.  Insuficiencia cardiaca u otros problemas relacionados con el corazn.  Cncer.  Enfermedad del rin.  Enfermedad heptica.  Efectos de ciertos [employer] antihistamnicos, medicamentos para la tos y el resfro, analgsicos prescriptos, medicamentos para la hipertensin arterial y Film/video editor, medicamentos utilizados para el tratamiento de cncer y algunos antidepresivos. SNTOMAS Los sntomas de fatiga son:   Hollace Kinnier de Teacher, early years/pre.  Falta de motivacin.  Somnolencia.  Sensacin de indiferencia Psychologist, occupational. DIAGNSTICO Los detalles de cmo usted siente guan a su mdico para Sales promotion account executive. Le preguntar sobre su estado de salud presente y pasado. Esto es importante para revisar todos los medicamentos que usted toma, incluyendo los recetados y los no recetados. Le realizar un examen fsico exhaustivo. Le preguntar acerca de sus sentimientos, hbitos y estilo de vida normal. Su mdico puede indicar anlisis de [place], de [place] u otras pruebas para buscar las causas ms comunes de la fatiga.  TRATAMIENTO La fatiga se trata corrigiendo la causa subyacente. Por ejemplo, si usted tiene dolor continuo o depresin, el tratamiento de estas causas mejorar el problema. De mismo modo, al ajustar la dosis de ciertos medicamentos ayudar a Sports coach.  INSTRUCCIONES PARA EL CUIDADO DOMICILIARIO  Trate de dormir lo suficiente todas las noches.  Mantenga una dieta sana y [place], y beba suficiente agua [place].  Practique modos de relajarse (como el yoga o la meditacin).  Haga ejercicios regularmente.  Haga planes para cambiar las situaciones que causan estrs. Haga esos planes de [employer] las tensiones disminuyan a lo largo del [place]. Mantenga su rutina personal y laboral dentro de lmites razonables.  Evite las drogas de la calle y minimice el consumo de alcohol.  Comience a tomar un multivitamnico diario tras consultar a su mdico. SOLICITE ATENCIN MDICA SI:  Sufre un cansancio persistente, que no puede describir.  Tiene fiebre.  Pierde peso de [place] involuntaria.  Sufre dolores de [place].  Sufre trastornos de [employer] noche.  Se siente triste.  Sufre constipacin.  Tiene la piel seca.  USAA.  Toma algn medicamento nuevo o diferente y sospecha que le causa fatiga.  No puede dormir por la noche.  Observa hinchazn inusual en sus piernas u otras partes del cuerpo. SOLICITE  ATENCIN MDICA SI:  Se siente confundido.  Su visin es borrosa.  Sufre mareos o se desmaya.  Sufre un dolor de cabeza intenso.  Sufre  un dolor abdominal, plvico o de espalda intensos.  Siente dolor en el pecho, le falta el aire o tiene un ritmo cardaco irregular o rpido.  No puede orinar normalmente.  Tiene una hemorragia anormal, como sangrado del recto o vomita sangre.  Tiene ideas de suicidio o de Marketing executive.  Est preocupado porque podra perjudicar a alguien ms. ASEGRESE QUE:   Comprende estas instrucciones.  Controlar su enfermedad.  Solicitar ayuda de inmediato si no mejora o si empeora. Document Released: 06/25/2008 Document Revised: 06/01/2011 [facility] Patient Information 2015 [place]. This information is not intended to replace advice given to you by your health care provider. Make sure you discuss any questions you have with your health care provider.

## 2014-07-06 NOTE — Progress Notes (Signed)
Patient ID: Brooke Lam, female   DOB: 11-20-1980, 34 y.o.   MRN: [id number]  TIW:[id number]  SNK:[id number]  DOB - 05/29/1980  CC:  Chief Complaint  Patient presents with  . Establish Care       HPI: Brooke Lam is a 34 y.o. female here today to establish medical care. Patient presents to clinic today for pain in BUE for 2 months. Pain is worse in the morning, BUE feel sore as if she has been beat up. She states that the pain is worse with touching her arms. She has pain in her head and lower back and feels fatigued daily. Been having headaches twice per week that are not strong, usually resolved with tylenol .  Hands and feet feel cold often. She reports a hysterectomy after a retained placenta that caused her severe complications. She notes a family history of thyroid disorder and diabetes.   No Known Allergies Past Medical History  Diagnosis Date  . Ectopic pregnancy   . Preterm labor    Current Outpatient Prescriptions on File Prior to Visit  Medication Sig Dispense Refill  . acetaminophen (TYLENOL) 500 MG tablet Take 500 mg by mouth every 6 (six) hours as needed for mild pain.    . cephALEXin (KEFLEX) 500 MG capsule Take 1 capsule (500 mg total) by mouth 3 (three) times daily. spanish (Patient not taking: Reported on 05/24/2014) 21 capsule 0  . cephALEXin (KEFLEX) 500 MG capsule Take 1 capsule (500 mg total) by mouth 3 (three) times daily. (Patient not taking: Reported on 07/06/2014) 30 capsule 0  . phenazopyridine (PYRIDIUM) 200 MG tablet Take 1 tablet (200 mg total) by mouth 3 (three) times daily as needed for pain. (Patient not taking: Reported on 07/06/2014) 15 tablet 0   No current facility-administered medications on file prior to visit.   Family History  Problem Relation Age of Onset  . Anesthesia problems Neg Hx   . Hearing loss Neg Hx   . Other Neg Hx   . Diabetes Father   . Diabetes Paternal Grandmother    History   Social History  . Marital  Status: Single    Spouse Name: N/A  . Number of Children: N/A  . Years of Education: N/A   Occupational History  . Not on file.   Social History Main Topics  . Smoking status: Former Smoker    Types: Cigarettes  . Smokeless tobacco: Never Used     Comment: quit 2005  . Alcohol Use: No  . Drug Use: No  . Sexual Activity: Yes    Birth Control/ Protection: None   Other Topics Concern  . Not on file   Social History Narrative    Review of Systems  Constitutional: Positive for malaise/fatigue. Negative for fever, chills and weight loss.  Eyes: Negative for blurred vision.  Genitourinary: Negative for frequency.  Neurological: Positive for weakness and headaches. Negative for dizziness and tingling.  Endo/Heme/Allergies: Negative for polydipsia.  All other systems reviewed and are negative.      Objective:   Filed Vitals:   07/06/14 1112  BP: 121/82  Pulse: 83  Temp: 98.7 F (37.1 C)  Resp: 16    Physical Exam: Constitutional: Patient appears well-developed and well-nourished. No distress. HENT: Normocephalic, atraumatic, External right and left ear normal. Oropharynx is clear and moist.  Eyes: Conjunctivae and EOM are normal. PERRLA, no scleral icterus. Neck: Normal ROM. Neck supple. No JVD. No tracheal deviation. No thyromegaly. CVS: RRR, S1/S2 +, no  murmurs, no gallops, no carotid bruit.  Pulmonary: Effort and breath sounds normal, no stridor, rhonchi, wheezes, rales.  Abdominal: Soft. BS +, no distension, tenderness, rebound or guarding.  Musculoskeletal: Normal range of motion. No edema and no tenderness.  Lymphadenopathy: No lymphadenopathy noted, cervical, inguinal or axillary Neuro: Alert. Normal reflexes, muscle tone coordination. No cranial nerve deficit. Skin: Skin is warm and dry. No rash noted. Not diaphoretic. No erythema. No pallor. Psychiatric: Normal mood and affect. Behavior, judgment, thought content normal.  Lab Results  Component Value Date     WBC 6.0 05/02/2013   HGB 11.8* 05/02/2013   HCT 35.1* 05/02/2013   MCV 80.7 05/02/2013   PLT 211 05/02/2013   Lab Results  Component Value Date   CREATININE 0.59 05/02/2013   BUN 14 05/02/2013   NA 138 05/02/2013   K 3.9 05/02/2013   CL 103 05/02/2013   CO2 24 05/02/2013    No results found for: HGBA1C Lipid Panel  No results found for: CHOL, TRIG, HDL, CHOLHDL, VLDL, LDLCALC     Assessment and plan:   Jessie was seen today for establish care.  Diagnoses and all orders for this visit:  Other fatigue Orders: -     CBC with Differential -     TSH -     T4, Free -     Vitamin B12 -     Vitamin D, 25-hydroxy I have advised patient to take a daily multi-vitamin  Myalgia Orders: -     COMPLETE METABOLIC PANEL WITH GFR -     ANA -     Rheumatoid factor  Family history of diabetes mellitus Orders: -     Hemoglobin A1c---I have very low suspicion that patient is diabetic.   Return if symptoms worsen or fail to improve.    Chari Manning, NP-C [facility] [phone] 07/06/2014, 11:26 AM

## 2014-07-07 LAB — VITAMIN D 25 HYDROXY (VIT D DEFICIENCY, FRACTURES): Vit D, 25-Hydroxy: 30 ng/mL (ref 30–100)

## 2014-07-09 LAB — ANA: ANA: NEGATIVE

## 2014-07-11 ENCOUNTER — Encounter: Payer: Self-pay | Admitting: *Deleted

## 2014-07-11 NOTE — Progress Notes (Signed)
Pt is aware of her lab results.  Interpreter [place].

## 2014-07-12 ENCOUNTER — Other Ambulatory Visit: Payer: Self-pay | Admitting: Obstetrics & Gynecology

## 2014-07-12 ENCOUNTER — Ambulatory Visit (INDEPENDENT_AMBULATORY_CARE_PROVIDER_SITE_OTHER): Payer: Self-pay | Admitting: Obstetrics & Gynecology

## 2014-07-12 ENCOUNTER — Encounter: Payer: Self-pay | Admitting: Obstetrics & Gynecology

## 2014-07-12 VITALS — BP 124/78 | HR 111 | Temp 99.0°F | Ht 63.0 in | Wt 168.0 lb

## 2014-07-12 DIAGNOSIS — Z90710 Acquired absence of both cervix and uterus: Secondary | ICD-10-CM

## 2014-07-12 DIAGNOSIS — N930 Postcoital and contact bleeding: Secondary | ICD-10-CM

## 2014-07-12 NOTE — Patient Instructions (Signed)
Informacin sobre Training and development officer (Hysterectomy Information)  La histerectoma es una ciruga que se realiza para extirpar el tero. Esta ciruga se puede realizar para tratar varios problemas mdicos. Despus de la ciruga, no volver a Personal assistant. Adems, debido a esta ciruga no podr quedar embarazada (estril). Asimismo, durante esta ciruga se podrn extirpar las trompas de falopio y los ovarios (salpingooforectoma bilateral).  RAZONES PARA REALIZAR UNA HISTERECTOMA  Sangrado persistente, anormal.  Dolor o infeccin persistente (crnico) en la pelvis.  El endometrio comienza a desarrollarse fuera del tero (endometriosis).  El endometrio comienza a desarrollarse en el msculo del tero (adenomiosis).  El tero desciende hacia la vagina (prolapso de los rganos de la pelvis).  Neoplasias benignas en el tero (fibromas uterinos) que provocan sntomas.  Clulas precancerosas.  Cncer cervical o cncer uterino. TIPOS DE HISTERECTOMA  Histerectoma supracervical: en este tipo de intervencin, se extirpa la parte superior del tero, pero no el cuello del tero.  Histerectoma total: se extirpan el tero y el cuello del tero.  Histerectoma radical: se extirpan el tero, el cuello del tero y los tejidos fibrosos que sostienen al tero en su lugar en la pelvis (parametrio). FORMAS EN QUE SE PUEDE REALIZAR UNA HISTERECTOMA  Histerectoma abdominal: se realiza un corte quirrgico grande (incisin) en el abdomen. Se extirpa el tero a travs de esta incisin.  Histerectoma vaginal: se realiza una incisin en la vagina. Se extirpa el tero a travs de esta incisin. No hay incisiones abdominales.  Histerectoma laparoscpica convencional: se realizan tres o cuatro incisiones pequeas en el abdomen. Se inserta un tubo delgado y luminoso con una cmara (laparoscopio) en una de las incisiones. A travs del resto de las incisiones se insertan otros instrumentos  quirrgicos. El tero se corta en trozos pequeos. Las piezas pequeas se eliminan a travs de las incisiones, o se retiran a travs de la vagina.  Histerectoma vaginal asistida por laparoscopia (HVAL): se realizan tres o cuatro incisiones pequeas en el abdomen. Parte de la ciruga se realiza por va laparoscpica y parte por va vaginal. El tero se extirpa a travs de la vagina.  Histerectoma laparoscpica asistida por robot: se insertan un laparoscopio y otros instrumentos quirrgicos en 3 o 4 incisiones pequeas en el abdomen. Se utiliza un dispositivo controlado por computadora para que el cirujano vea una imagen en 3D que lo ayuda a Chief Technology Officer los instrumentos quirrgicos. Esto permite movimientos ms precisos de los instrumentos quirrgicos. El tero se corta en trozos pequeos y se retira a travs de las incisiones o se elimina a travs de la vagina. RIESGOS Y [place] complicaciones asociadas con este procedimiento incluyen las siguientes:  Hemorragia y riesgos de Designer, industrial/product una transfusin sangunea. Infrmele al mdico si no quiere recibir ningn tipo de hemoderivados.  Cogulos de [employer] piernas o los pulmones.  Infeccin.  Lesin en los rganos circundantes.  Problemas o efectos secundarios relacionados con la anestesia.  Transformacin de cualquiera de las otras tcnicas en una histerectoma abdominal. QU ESPERAR DESPUES DE UNA HISTERECTOMA  Le darn medicamentos para el dolor.  Necesitar que alguien Nature conservation officer con usted durante los primeros 3 a 5 das despus de que regrese a Medical illustrator.  Tendr que ver al cirujano para un seguimiento 2 a 4 semanas despus de la ciruga para evaluar su progreso.  Posiblemente tenga sntomas tempranos de menopausia, como sofocos, sudoracin nocturna e insomnio.  Si le han realizado una histerectoma debido a un problema que no era cncer ni Ardelia Mems  afeccin que poda causar cncer, ya no necesitar una prueba de  Papanicolaou. Sin embargo, si ya no necesita hacerse un Papanicolau, es una buena idea hacerse un examen regularmente para asegurarse de que no hay otros problemas. Document Released: 03/09/2005 Document Revised: 12/28/2012 [facility] Patient Information 2015 [place], [place]. This information is not intended to replace advice given to you by your health care provider. Make sure you discuss any questions you have with your health care provider.

## 2014-07-12 NOTE — Progress Notes (Signed)
Patient ID: Brooke Lam, female   DOB: Dec 12, 1980, 34 y.o.   MRN: [id number] Zenda Alpers Spanish Interpreter  Pt has bright bleeding when having sexual relations depending on sexual position.

## 2014-07-12 NOTE — Progress Notes (Signed)
Patient ID: Brooke Lam, female   DOB: August 30, 1980, 34 y.o.   MRN: [id number]  Chief Complaint  Patient presents with  . Gynecologic Exam    HPI Brooke Lam is a 34 y.o. female.  Patient's last menstrual period was 10/08/2011. C hyst done with delivery for bleeding increta at [facility]. Occasionally has postcoital spotting. No pain or discharge. Cervix not completely excised per path report. Last pap and HPV neg HPI  Past Medical History  Diagnosis Date  . Ectopic pregnancy   . Preterm labor     Past Surgical History  Procedure Laterality Date  . Cesarian section    . Cesarean section      C/S x 4  . Abdominal hysterectomy      Family History  Problem Relation Age of Onset  . Anesthesia problems Neg Hx   . Hearing loss Neg Hx   . Other Neg Hx   . Diabetes Father   . Diabetes Paternal Grandmother     Social History History  Substance Use Topics  . Smoking status: Former Smoker    Types: Cigarettes  . Smokeless tobacco: Never Used     Comment: quit 2005  . Alcohol Use: No    No Known Allergies  No current outpatient prescriptions on file.   No current facility-administered medications for this visit.    Review of Systems Review of Systems  Constitutional: Negative.   Genitourinary: Positive for vaginal bleeding. Negative for vaginal discharge and pelvic pain.    Blood pressure 124/78, pulse 111, temperature 99 F (37.2 C), height 5\' 3"  (1.6 m), weight 168 lb (76.204 kg), last menstrual period 10/08/2011.  Physical Exam Physical Exam  Constitutional: She is oriented to person, place, and time. She appears well-developed. No distress.  Genitourinary: Vagina normal. No vaginal discharge found.  Parous cervix, no lesion  Neurological: She is alert and oriented to person, place, and time.  Skin: Skin is warm and dry.  Psychiatric: She has a normal mood and affect. Her behavior is normal.    Data Reviewed Path report  hysterectomy  Assessment    STD and wet prep     Plan    Discussed pap f/u and RTC 2 mo to review        ARNOLD,JAMES 07/12/2014, 2:49 PM

## 2014-07-13 LAB — WET PREP, GENITAL
TRICH WET PREP: NONE SEEN
Yeast Wet Prep HPF POC: NONE SEEN

## 2014-07-13 LAB — GC/CHLAMYDIA PROBE AMP
CT Probe RNA: NEGATIVE
GC PROBE AMP APTIMA: NEGATIVE

## 2014-07-17 ENCOUNTER — Ambulatory Visit: Payer: Self-pay | Attending: Internal Medicine | Admitting: Physician Assistant

## 2014-07-17 VITALS — BP 121/74 | HR 102 | Temp 99.4°F | Wt 169.0 lb

## 2014-07-17 DIAGNOSIS — R350 Frequency of micturition: Secondary | ICD-10-CM

## 2014-07-17 DIAGNOSIS — N3001 Acute cystitis with hematuria: Secondary | ICD-10-CM

## 2014-07-17 LAB — POCT URINALYSIS DIPSTICK
BILIRUBIN UA: NEGATIVE
Glucose, UA: NEGATIVE
KETONES UA: NEGATIVE
NITRITE UA: NEGATIVE
PH UA: 8.5
Protein, UA: 300
SPEC GRAV UA: 1.015
UROBILINOGEN UA: 0.2

## 2014-07-17 MED ORDER — METRONIDAZOLE 0.75 % VA GEL: 1.0000 | Freq: Every day | VAGINAL | Status: AC

## 2014-07-17 MED ORDER — CIPROFLOXACIN HCL 500 MG PO TABS: 500.0000 mg | ORAL_TABLET | Freq: Two times a day (BID) | ORAL | Status: DC

## 2014-07-17 NOTE — Progress Notes (Signed)
Chief Complaint: Urinary frequency and back pain  Subjective: This is a 34 year old Hispanic female who is presenting with 1-2 days of pressure in the lower abdominal area as well as urinary frequency. She also has some blood noted in her urine this morning. She burns when she PEs. She also has had some lower back pain. She recently saw her OB/GYN and was doing relatively well at that time. She had been having some vaginal bleeding that was addressed. She also had STD screening which was negative and a wet prep that showed a few clue cells left untreated. She has minimal vaginal discharge.  ROS:  GEN: denies fever or chills, denies change in weight Skin: denies lesions or rashes ABD: denies abd pain, N or V Pelvis-+discharge +blood   Objective:  Filed Vitals:   07/17/14 1436  BP: 121/74  Pulse: 102  Temp: 99.4 F (37.4 C)  Weight: 169 lb (76.658 kg)  SpO2: 98%    Physical Exam:  General: in no acute distress. HEENT: no pallor, no icterus, moist oral mucosa, no JVD, no lymphadenopathy Abdomen: Soft, nontender, nondistended, positive bowel sounds. No pelvic exam  Pertinent Lab Results:+blood, mod leukocytes   Medications: Prior to Admission medications   Medication Sig Start Date End Date Taking? Authorizing Provider  ciprofloxacin (CIPRO) 500 MG tablet Take 1 tablet (500 mg total) by mouth 2 (two) times daily. 07/17/14   Peter Keyworth Daneil Dan, PA-C  metroNIDAZOLE (METROGEL VAGINAL) 0.75 % vaginal gel Place 1 Applicatorful vaginally at bedtime. 07/17/14 07/21/14  Brayton Caves, PA-C    Assessment: 1. Acute Cystitis 2. Bacterial Vaginosis  Plan: Cipro 500 mg BID for 5 days Metrogel Vaginal prep at bedtime for 5 days   Follow up:as scheduled  The patient was given clear instructions to go to ER or return to medical center if symptoms don't improve, worsen or new problems develop. The patient verbalized understanding. The patient was told to call to get lab results if they  haven't heard anything in the next week.   This note has been created with Surveyor, quantity. Any transcriptional errors are unintentional.   Zettie Pho, PA-C 07/17/2014, 3:06 PM

## 2014-07-18 ENCOUNTER — Encounter: Payer: Self-pay | Admitting: Internal Medicine

## 2014-07-18 ENCOUNTER — Ambulatory Visit: Payer: Self-pay | Attending: Internal Medicine | Admitting: Internal Medicine

## 2014-07-18 DIAGNOSIS — N3000 Acute cystitis without hematuria: Secondary | ICD-10-CM

## 2014-07-18 DIAGNOSIS — M79603 Pain in arm, unspecified: Secondary | ICD-10-CM | POA: Insufficient documentation

## 2014-07-18 DIAGNOSIS — N3090 Cystitis, unspecified without hematuria: Secondary | ICD-10-CM | POA: Insufficient documentation

## 2014-07-18 LAB — POCT URINALYSIS DIPSTICK
BILIRUBIN UA: NEGATIVE
Glucose, UA: NEGATIVE
Ketones, UA: NEGATIVE
Leukocytes, UA: NEGATIVE
NITRITE UA: NEGATIVE
PH UA: 6.5
Protein, UA: NEGATIVE
Spec Grav, UA: <=1.005
Urobilinogen, UA: 0.2

## 2014-07-18 NOTE — Progress Notes (Signed)
Patient ID: Brooke Lam, female   DOB: 02-02-1981, 34 y.o.   MRN: [id number]  CC: arm pain, flank pain   HPI: Brooke Lam is a 34 y.o. female here today for a follow up visit for arm pain and flank pain.  [name]  She reports that she has begun the medication but has noticed some flank pain. She denies fever, chills, nausea, vomiting. She reports that she continues to have arm pain but does not know the cause. She feels lie her arms are achy. She would like to know the results of her lab test.  Patient has No headache, No chest pain, No abdominal pain - No Nausea, No new weakness tingling or numbness, No Cough - SOB.  No Known Allergies Past Medical History  Diagnosis Date  . Ectopic pregnancy   . Preterm labor    Current Outpatient Prescriptions on File Prior to Visit  Medication Sig Dispense Refill  . ciprofloxacin (CIPRO) 500 MG tablet Take 1 tablet (500 mg total) by mouth 2 (two) times daily. 10 tablet 0  . metroNIDAZOLE (METROGEL VAGINAL) 0.75 % vaginal gel Place 1 Applicatorful vaginally at bedtime. 70 g 0   No current facility-administered medications on file prior to visit.   Family History  Problem Relation Age of Onset  . Anesthesia problems Neg Hx   . Hearing loss Neg Hx   . Other Neg Hx   . Diabetes Father   . Diabetes Paternal Grandmother    History   Social History  . Marital Status: Single    Spouse Name: N/A  . Number of Children: N/A  . Years of Education: N/A   Occupational History  . Not on file.   Social History Main Topics  . Smoking status: Former Smoker    Types: Cigarettes  . Smokeless tobacco: Never Used     Comment: quit 2005  . Alcohol Use: No  . Drug Use: No  . Sexual Activity: Yes    Birth Control/ Protection: None   Other Topics Concern  . Not on file   Social History Narrative    Review of Systems: See HPI.    Objective:    Filed Vitals:   07/18/14 1534  BP: 113/79  Pulse: 103  Temp: 98.6 F (37 C)  Resp: 16    Physical Exam  Cardiovascular: Normal rate, regular rhythm and normal heart sounds.   Pulmonary/Chest: Effort normal and breath sounds normal.  Abdominal: Soft. She exhibits no distension. There is no tenderness.  NO CVA tenderness   Musculoskeletal: Normal range of motion. She exhibits no edema or tenderness.     Lab Results  Component Value Date   WBC 6.2 07/06/2014   HGB 12.4 07/06/2014   HCT 37.8 07/06/2014   MCV 81.6 07/06/2014   PLT 244 07/06/2014   Lab Results  Component Value Date   CREATININE 0.71 07/06/2014   BUN 15 07/06/2014   NA 139 07/06/2014   K 4.9 07/06/2014   CL 105 07/06/2014   CO2 24 07/06/2014    Lab Results  Component Value Date   HGBA1C 6.0* 07/06/2014   Lipid Panel  No results found for: CHOL, TRIG, HDL, CHOLHDL, VLDL, LDLCALC     Assessment and plan:   Brooke Lam was seen today for follow-up.  Diagnoses and all orders for this visit:  Pain of upper extremity, unspecified laterality I have advised patient that all blood work  is normal. She may try tylenol for pain.  I cannot find a clear etiology to patients achy arms  Acute cystitis without hematuria Orders: -     Urinalysis Dipstick I do not believe patient has pyelonephritis at this time. I have went over signs and symptoms that should warrant immediate return to clinic or ER.  Due to language barrier, an interpreter was present during the history-taking and subsequent discussion (and for part of the physical exam) with this patient.  Return if symptoms worsen or fail to improve.       Chari Manning, NP-C [facility] and [facility]ness [phone] 07/18/2014, 3:58 PM

## 2014-07-18 NOTE — Progress Notes (Signed)
Pt is here b/c she has pain in her arms. Pt states that yesterday she started having pain in her left flank. Interpreter [place].

## 2014-07-18 NOTE — Patient Instructions (Signed)
Vaginitis atrfica (Atrophic Vaginitis) La vaginitis atrfica es un problema de bajos niveles de estrgeno en la [place]. Este problema puede ocurrir a Hotel manager. Es ms frecuente en mujeres que han pasado la menopausia ("el cambio").  CMO SE SI TENGO ESTE PROBLEMA? Puede tener:  Problemas al [place] como:  Ir al bao con frecuencia.  Mucho tiempo reteniendo Fish farm manager un bao.  Prdidas de pis.  Dolor al [place].  Sensacin de picazn o ardor.  Hemorragia o goteo vaginal.  [place].  Sequedad de la vagina.  Una prdida vaginal amarillenta con olor ftido. [place] PROBLEMA?  Durante el examen, el mdico probablemente encuentre el problema.  Si hay una prdida vaginal, se analizar para ver si hay infeccin. CMO SE TRATAR ESTE PROBLEMA? Mantenga la piel de la vulva tan limpia como sea posible. Los humectantes y lubricantes sern de utilidad para este problema. Un reemplazo de Photographer. Hay 2 modos de tomar estrgenos:  Estrgeno sistmico el estrgeno ingresa a todo el cuerpo. Toma semanas a meses para que los sntomas mejoren.  Puede tomar pldoras de estrgeno.  Puede utilizar un parche para la piel. Es un parche que se coloca en la piel.  Si tiene tero, el [employer] recomendar que tome hormonas. Hable con el mdico sobre la medicina correcta para usted.  Estrgenos en crema. El estrgeno se Risk manager en la parte del cuerpo que la aplique. La crema se coloca en la vagina o en la piel de la vulva. Para algunas mujeres, las cremas de estrgeno trabajan ms rpido que las pldoras o los parches. PUEDEN TODAS LAS MUJERES CON ESTE PROBLEMA UTILIZAR ESTRGENOS? No. las mujeres con ciertos tipos de cncer, problemas del hgado o con cogulos sanguneos no debern tomar estrgenos. El profesional que lo asiste le indicar cules son los mejores tratamientos para sus sntomas. Document Released:  06/05/2008 Document Revised: 03/14/2013 [facility] Patient Information 2015 [place]. This information is not intended to replace advice given to you by your health care provider. Make sure you discuss any questions you have with your health care provider.

## 2014-09-12 ENCOUNTER — Ambulatory Visit: Payer: Self-pay | Admitting: Obstetrics & Gynecology

## 2014-09-20 ENCOUNTER — Ambulatory Visit: Payer: Self-pay | Admitting: Obstetrics & Gynecology

## 2014-10-17 ENCOUNTER — Ambulatory Visit (INDEPENDENT_AMBULATORY_CARE_PROVIDER_SITE_OTHER): Payer: Self-pay | Admitting: Obstetrics & Gynecology

## 2014-10-17 ENCOUNTER — Encounter: Payer: Self-pay | Admitting: Obstetrics & Gynecology

## 2014-10-17 VITALS — BP 120/85 | HR 108 | Temp 98.9°F | Resp 20 | Ht 63.0 in | Wt 157.6 lb

## 2014-10-17 DIAGNOSIS — Z90710 Acquired absence of both cervix and uterus: Secondary | ICD-10-CM

## 2014-10-17 NOTE — Progress Notes (Signed)
Patient ID: Brooke Lam, female   DOB: May 21, 1980, 34 y.o.   MRN: [id number]  Chief Complaint  Patient presents with  . Follow-up  spotting and pain with coitus for 2 months  HPI Brooke Lam is a 34 y.o. female.  [id number] Patient's last menstrual period was 10/08/2011. S/p c/hyst 04/2012 at [facility], above complaint.     HPI  Past Medical History  Diagnosis Date  . Ectopic pregnancy   . Preterm labor     Past Surgical History  Procedure Laterality Date  . Cesarian section    . Cesarean section      C/S x 4  . Abdominal hysterectomy      Family History  Problem Relation Age of Onset  . Anesthesia problems Neg Hx   . Hearing loss Neg Hx   . Other Neg Hx   . Diabetes Father   . Diabetes Paternal Grandmother     Social History History  Substance Use Topics  . Smoking status: Current Some Day Smoker    Types: Cigarettes  . Smokeless tobacco: Never Used     Comment: quit 2005  . Alcohol Use: No    No Known Allergies  No current outpatient prescriptions on file.   No current facility-administered medications for this visit.    Review of Systems Review of Systems  Constitutional: Negative.   Gastrointestinal: Negative.   Genitourinary: Positive for vaginal bleeding, pelvic pain and dyspareunia. Negative for vaginal discharge and menstrual problem.    Blood pressure 120/85, pulse 108, temperature 98.9 F (37.2 C), temperature source Oral, resp. rate 20, height 5\' 3"  (1.6 m), weight 157 lb 9.6 oz (71.487 kg), last menstrual period 10/08/2011.  Physical Exam Physical Exam  Constitutional: She appears well-developed. No distress.  Pulmonary/Chest: Effort normal. No respiratory distress.  Abdominal: Soft. There is no tenderness.  Genitourinary: Vagina normal. No vaginal discharge found.  Cervix anterior, pap done, no mass not tender  Skin: Skin is warm and dry.  Psychiatric: She has a normal mood and affect. Her behavior is normal.     Data Reviewed Pap result 2013 nl  Assessment    Dyspareunia and spotting, s/p supracervical hysterectomy    R/O abnl at cervical stump Plan    Pap done and ordered pelvic US, RTC to review       Brooke Lam 10/17/2014, 2:13 PM

## 2014-10-17 NOTE — Progress Notes (Signed)
Interpreter [employer] present for encounter. Pt reports that since her last visit, she has continued to have bleeding after intercourse. This bleeding lasts for a couple hours and is as heavy as a period. She also has abdominal/pelvic pain after some episodes of intercourse as well as sometimes not related to intercourse. She takes advil for the pain with some relief.  Pt states that she was not clear in her understanding at the time of her last visit whether or not she still has a cervix - unsure if she needs Pap test today.

## 2014-10-17 NOTE — Patient Instructions (Signed)
Dispareunia  (Dyspareunia) La dispareunia es el dolor [place]. Es ms comn en las mujeres, pero tambin ocurre [employer]  [place] dolor se siente cuando algo penetra en la vagina, pero cualquier parte de los genitales pueden causar dolor durante las relaciones sexuales. Puede sentir dolor incluso al sentarse o usar pantalones. En algunos casos no se conoce la causa. Algunas causas:   Infecciones de la piel que rodea la vagina.  Las infecciones vaginales, tales como hongos, bacterias o infeccin viral.  Vaginismo. Es la imposibilidad de Best boy algo en la vagina, incluso queriendo [place]. Hay una contraccin muscular automtica y Social research officer, government. El dolor de la contraccin muscular puede ser tan intenso que el coito es imposible.  Reaccin alrgica a los espermicidas, semen, preservativos, tampones perfumados, jabones, duchas y [employer].  Un saco lleno de lquido (quiste) en las glndulas de [place] o de [place], que se encuentra en la apertura de la vagina.  Tejido cicatrizal en la vagina por un agrandamiento quirrgico de la abertura (episiotoma) o desgarro despus de Best boy un beb.  Sequedad vaginal. Esto es ms frecuente en la menopausia. Las secreciones normales de la vagina estn disminuidas. Los [place] niveles de estrgeno y la mayor dificultad para excitarse pueden causar dolor durante el sexo. La sequedad vaginal tambin puede ocurrir cuando se toman pldoras anticonceptivas.  Adelgazamiento del tejido (atrofia) de la vulva y la vagina. Esto hace que la zona sea ms delgada, ms pequea, incapaz de estirarse para acomodar el pene, y propensa a infecciones y Tree surgeon.  Vestibulitis vulvar o vestibulodinia. Esta es una enfermedad que causa dolor y afecta el rea alrededor de la entrada de la vagina. La causa ms comn en las mujeres jvenes son las pldoras anticonceptivas. Las mujeres con niveles bajos de estrgenos (mujeres  posmenopusicas) tambin pueden experimentarlo.Otras causas incluyen reacciones alrgicas, gran cantidad de terminaciones nerviosas, afecciones de la piel y msculos plvicos que no pueden relajarse.  Dermatosis vulvar. Esto incluye enfermedades de la piel como el liquen escleroso y liquen plano.  Falta de juegos previos para la lubricacin de la vagina. Esto puede causar sequedad vaginal.  Los tumores no cancerosos (fibromas) en el tero.  El tejido que reviste internamente al tero se desarrolla fuera del mismo (endometriosis).  Embarazo que comienza en la trompa de [place] (embarazo tubrico).  El embarazo o estar amamantando al beb. Esto puede causar sequedad vaginal.  Inclinacin o prolapso del tero. El prolapso se produce cuando los msculos dbiles y estirados alrededor del tero dejan que caiga en la vagina.  Problemas en los ovarios, quistes o cicatrices. Esto puede ser peor con ciertas posiciones sexuales.  Cirugas previas causando adherencias o tejido cicatrizal en la vagina o la pelvis.  Problemas en la vejiga e intestinales.  Problemas psicolgicos (como depresin o ansiedad). Esto puede hacer que el dolor empeore.  Actitudes negativas con respecto al sexo, haber sufrido una violacin, agresin sexual, y Technical brewer. Estos problemas suelen estar relacionados con algunos tipos de dolor.  Infeccin plvica previa, causando un tejido cicatrizal en la pelvis y en los rganos femeninos.  Quiste o tumor en el ovario.  Cncer en los rganos femeninos.  Ciertos medicamentos.  Enfermedades como diabetes, artritis, o enfermedad de la tiroides. Masculino En los hombres, hay muchas causas fsicas del malestar sexual. Algunas causas:   Infecciones de la prstata, la vejiga, o las vesculas seminales. Pueden causar dolor despus de Insurance risk surveyor.  Inflamacin de la vejiga (cistitis  intersticial). Puede causar dolor durante la eyaculacin.  Infecciones  por gonorrea. Puede causar dolor durante la eyaculacin.  Inflamacin de la uretra (uretritis) o inflamacin de la prstata (prostatitis) . Puede hacer que la estimulacin genital sea dolorosa o incmoda.  Deformidades del pene como la enfermedad de [place].  Un prepucio estrecho.  Cncer en los rganos reproductivos masculinos.  Problemas psiclogicos. Esto puede hacer que el dolor empeore. DIAGNSTICO   El mdico har la historia clnica y tendr que describir el lugar donde se Midwife (fuera de la vagina, en la vagina, en la pelvis). Le preguntar en qu momento experimenta el dolor, durante la penetracin o con los choques.  Luego el [employer]om har un examen fsico. Hgale saber si sufre dolor durante el examen.  Durante la parte final del examen femenino, su mdico le palpar el tero y los ovarios con la mano sobre el abdomen y un dedo en la vagina. Es un examen plvico.  Le pedir anlisis de sangre, un Papanicolaou, cultivos en busca de infecciones, una ecografa y radiografas. Es posible que necesite ver a un especialista por problemas femeninos (gineclogo).  Su mdico indicar que se haga una tomografa computada, una resonancia magntica o una laparoscopia. La laparoscopia es un procedimiento para examinar la pelvis con un tubo con luz, a travs de un corte (incisin) en el abdomen. TRATAMIENTO  Su mdico determinar el mejor curso de [place]. En algunos casos se solicitan ms pruebas. Contine con los estudios indicados hasta que el mdico est seguro acerca del diagnstico y [place]. A veces, es difcil de Animator causa del dolor. La bsqueda de la causa y el tratamiento pueden ser frustrantes. El tratamiento generalmente demora varias semanas o meses antes de notar [place]. Tambin puede ser necesario evitar la actividad sexual hasta que los sntomas mejoren. Si sigue teniendo Armed forces operational officer cuando tiene dolor puede Lexicographer la curacin y  Patent examiner.  El tratamiento depende de la causa del dolor. Puede incluir:   Medicamentos como antibiticos, cremas vaginales, para la piel, hormonas, o antidepresivos.  Ciruga mayor o menor.  El asesoramiento psicolgico o terapia de [place].  Ejercicios de Kegel y dilatadores vaginales para ayudar en ciertos casos de vaginismo (espasmos). Hgalos slo si se lo recomienda su mdico. Los ejercicios Kegel pueden hacer que algunos problemas empeoren.  La aplicacin de un lubricante segn las indicaciones del mdico si tiene sequedad.  Terapia sexual para usted y su compaero. Es comn que el dolor contine despus de tratar la causa. Puede ser por Ardelia Mems respuesta condicionada. Esto significa la persona se vuelve tan familiar con el dolor que persiste como [place], aunque se elimine la causa. La terapia sexual puede ayudar con este problema.  INSTRUCCIONES PARA EL CUIDADO EN EL HOGAR   Siga las instrucciones de su mdico acerca de como Mattel, las [place], terapias y visitas de control.  No use tampones, duchas vaginales , aerosoles vaginales o jabones perfumados.  Use lubricantes a base de agua para la sequedad. Los lubricantes con aceites pueden causar irritacin.  No utilice espermicidas o condones que Lobbyist.  Hable abiertamente con su pareja sobre su experiencia sexual , sus deseos, el juego previo y las diferentes posiciones sexuales para Ardelia Mems relacin sexual ms cmoda y Materials engineer.  nase a sesiones de [place], si es necesario.  Practique sexo seguro en todo momento.  Vace su vejiga antes de Clinical biochemist.  Pruebe diferentes posiciones durante el coito.  [employer]idental Petroleum  medicamentos de venta libre para Conservation officer, historic buildings segn las indicaciones del mdico, antes de Clinical biochemist.  No use panties. Use las medias que llegan a la altura de la rodilla o del muslo.  Evite frotar la vulva con un pao. Lave suavemente la zona y squela con  una toalla dando golpecitos. SOLICITE ATENCIN MDICA SI:   Tiene hemorragias [place].  Observa un bulto en la abertura de la vagina, aunque no sea doloroso.  Tiene flujo vaginal anormal.  Tiene sequedad vaginal.  Siente tiene picazn o irritacin de la vulva o la vagina.  Aparece una erupcin o tiene una reaccin a los medicamentos. [place] DE INMEDIATO SI:   Siente dolor abdominal intenso durante o poco despus [place]. Usted podra haber sufrido la ruptura de un quiste ovrico o un embarazo ectpico.  Tiene fiebre.  Comienza a Education officer, environmental al orinar u [employer].  Tiene relaciones sexuales dolorosas, y Psychiatrist ocurri antes.  Se desmaya despus de Clinical biochemist. Document Released: 11/19/2010 Document Revised: 06/01/2011 [facility] Patient Information 2015 [place]. This information is not intended to replace advice given to you by your health care provider. Make sure you discuss any questions you have with your health care provider.

## 2014-10-22 LAB — CYTOLOGY - PAP

## 2014-10-31 ENCOUNTER — Ambulatory Visit (HOSPITAL_COMMUNITY)
Admission: RE | Admit: 2014-10-31 | Discharge: 2014-10-31 | Disposition: A | Discharge status: Home / Self Care | Payer: Self-pay | Source: Ambulatory Visit | Attending: Obstetrics & Gynecology | Admitting: Obstetrics & Gynecology

## 2014-10-31 DIAGNOSIS — Z90710 Acquired absence of both cervix and uterus: Secondary | ICD-10-CM

## 2014-10-31 DIAGNOSIS — N939 Abnormal uterine and vaginal bleeding, unspecified: Secondary | ICD-10-CM | POA: Insufficient documentation

## 2014-11-12 ENCOUNTER — Ambulatory Visit (INDEPENDENT_AMBULATORY_CARE_PROVIDER_SITE_OTHER): Payer: Self-pay | Admitting: Obstetrics & Gynecology

## 2014-11-12 VITALS — BP 117/77 | HR 103 | Temp 98.7°F | Ht 63.0 in | Wt 158.0 lb

## 2014-11-12 DIAGNOSIS — IMO0002 Reserved for concepts with insufficient information to code with codable children: Secondary | ICD-10-CM | POA: Insufficient documentation

## 2014-11-12 DIAGNOSIS — N941 Dyspareunia: Secondary | ICD-10-CM

## 2014-11-12 LAB — TSH: TSH: 1.432 u[IU]/mL (ref 0.350–4.500)

## 2014-11-12 MED ORDER — MEDROXYPROGESTERONE ACETATE 10 MG PO TABS: 20.0000 mg | ORAL_TABLET | Freq: Every day | ORAL | Status: DC

## 2014-11-12 NOTE — Progress Notes (Signed)
Patient ID: Jentri Aye, female   DOB: 07-01-1980, 34 y.o.   MRN: [id number]  Chief Complaint  Patient presents with  . Follow-up  spotting and painful intercourse and decreased libido  HPI Genetta Fiero is a 34 y.o. female.  [id number] [place] was normal s/p hysterectomy. Still has pain and spotting with intercourse, vaginal dryness and VMS noted as well  HPI  Past Medical History  Diagnosis Date  . Ectopic pregnancy   . Preterm labor     Past Surgical History  Procedure Laterality Date  . Cesarian section    . Cesarean section      C/S x 4  . Abdominal hysterectomy      Family History  Problem Relation Age of Onset  . Anesthesia problems Neg Hx   . Hearing loss Neg Hx   . Other Neg Hx   . Diabetes Father   . Diabetes Paternal Grandmother     Social History Social History  Substance Use Topics  . Smoking status: Current Some Day Smoker    Types: Cigarettes  . Smokeless tobacco: Never Used     Comment: quit 2005  . Alcohol Use: No    No Known Allergies  Current Outpatient Prescriptions  Medication Sig Dispense Refill  . medroxyPROGESTERone (PROVERA) 10 MG tablet Take 2 tablets (20 mg total) by mouth daily. 30 tablet 2   No current facility-administered medications for this visit.    Review of Systems Review of Systems  Endocrine:       VMS, hair loss  Genitourinary: Positive for vaginal bleeding. Negative for vaginal discharge and menstrual problem.    Blood pressure 117/77, pulse 103, temperature 98.7 F (37.1 C), height 5\' 3"  (1.6 m), weight 158 lb (71.668 kg), last menstrual period 10/08/2011.  Physical Exam Physical Exam  Constitutional: She is oriented to person, place, and time. She appears well-developed. No distress.  Pulmonary/Chest: Effort normal.  Neurological: She is alert and oriented to person, place, and time.  Psychiatric: She has a normal mood and affect.    Data Reviewed   CLINICAL DATA: Spotting and pain status  post colitis x2 months, status post hysterectomy in 2014  EXAM: [place]: Both transabdominal and transvaginal ultrasound examinations of the pelvis were performed. Transabdominal technique was performed for global imaging of the pelvis including uterus, ovaries, adnexal regions, and pelvic cul-de-sac. It was necessary to proceed with endovaginal exam following the transabdominal exam to visualize the right ovary.  COMPARISON: 06/12/2013  FINDINGS: Uterus  Surgically absent.  Right ovary  Measurements: 3.4 x 2.8 x 1.2 cm. Normal appearance/no adnexal mass.  Left ovary  Measurements: 3.4 x 2.6 x 2.0 cm. Normal appearance/no adnexal mass.  Other findings  No free fluid.  IMPRESSION: Status post hysterectomy.  Normal sonographic appearance of the bilateral ovaries.   Electronically Signed  By: Julian Hy M.D.  On: 10/31/2014 12:57     Assessment    Dyspareunia, spotting Vaginal dryness and VMS, r/o ovarian failure   possible residual endometrium in retained LUS(SCH)  Plan    TSH FSH Provera 20 mg daily RTC 2 months       Yoel Kaufhold 11/12/2014, 1:31 PM

## 2014-11-12 NOTE — Patient Instructions (Signed)
Dispareunia  (Dyspareunia) La dispareunia es el dolor [place]. Es ms comn en las mujeres, pero tambin ocurre [employer]  [place] dolor se siente cuando algo penetra en la vagina, pero cualquier parte de los genitales pueden causar dolor durante las relaciones sexuales. Puede sentir dolor incluso al sentarse o usar pantalones. En algunos casos no se conoce la causa. Algunas causas:   Infecciones de la piel que rodea la vagina.  Las infecciones vaginales, tales como hongos, bacterias o infeccin viral.  Vaginismo. Es la imposibilidad de Best boy algo en la vagina, incluso queriendo [place]. Hay una contraccin muscular automtica y Social research officer, government. El dolor de la contraccin muscular puede ser tan intenso que el coito es imposible.  Reaccin alrgica a los espermicidas, semen, preservativos, tampones perfumados, jabones, duchas y [employer].  Un saco lleno de lquido (quiste) en las glndulas de [place] o de [place], que se encuentra en la apertura de la vagina.  Tejido cicatrizal en la vagina por un agrandamiento quirrgico de la abertura (episiotoma) o desgarro despus de Best boy un beb.  Sequedad vaginal. Esto es ms frecuente en la menopausia. Las secreciones normales de la vagina estn disminuidas. Los [place] niveles de estrgeno y la mayor dificultad para excitarse pueden causar dolor durante el sexo. La sequedad vaginal tambin puede ocurrir cuando se toman pldoras anticonceptivas.  Adelgazamiento del tejido (atrofia) de la vulva y la vagina. Esto hace que la zona sea ms delgada, ms pequea, incapaz de estirarse para acomodar el pene, y propensa a infecciones y Tree surgeon.  Vestibulitis vulvar o vestibulodinia. Esta es una enfermedad que causa dolor y afecta el rea alrededor de la entrada de la vagina. La causa ms comn en las mujeres jvenes son las pldoras anticonceptivas. Las mujeres con niveles bajos de estrgenos (mujeres  posmenopusicas) tambin pueden experimentarlo.Otras causas incluyen reacciones alrgicas, gran cantidad de terminaciones nerviosas, afecciones de la piel y msculos plvicos que no pueden relajarse.  Dermatosis vulvar. Esto incluye enfermedades de la piel como el liquen escleroso y liquen plano.  Falta de juegos previos para la lubricacin de la vagina. Esto puede causar sequedad vaginal.  Los tumores no cancerosos (fibromas) en el tero.  El tejido que reviste internamente al tero se desarrolla fuera del mismo (endometriosis).  Embarazo que comienza en la trompa de [place] (embarazo tubrico).  El embarazo o estar amamantando al beb. Esto puede causar sequedad vaginal.  Inclinacin o prolapso del tero. El prolapso se produce cuando los msculos dbiles y estirados alrededor del tero dejan que caiga en la vagina.  Problemas en los ovarios, quistes o cicatrices. Esto puede ser peor con ciertas posiciones sexuales.  Cirugas previas causando adherencias o tejido cicatrizal en la vagina o la pelvis.  Problemas en la vejiga e intestinales.  Problemas psicolgicos (como depresin o ansiedad). Esto puede hacer que el dolor empeore.  Actitudes negativas con respecto al sexo, haber sufrido una violacin, agresin sexual, y Technical brewer. Estos problemas suelen estar relacionados con algunos tipos de dolor.  Infeccin plvica previa, causando un tejido cicatrizal en la pelvis y en los rganos femeninos.  Quiste o tumor en el ovario.  Cncer en los rganos femeninos.  Ciertos medicamentos.  Enfermedades como diabetes, artritis, o enfermedad de la tiroides. Masculino En los hombres, hay muchas causas fsicas del malestar sexual. Algunas causas:   Infecciones de la prstata, la vejiga, o las vesculas seminales. Pueden causar dolor despus de Insurance risk surveyor.  Inflamacin de la vejiga (cistitis  intersticial). Puede causar dolor durante la eyaculacin.  Infecciones  por gonorrea. Puede causar dolor durante la eyaculacin.  Inflamacin de la uretra (uretritis) o inflamacin de la prstata (prostatitis) . Puede hacer que la estimulacin genital sea dolorosa o incmoda.  Deformidades del pene como la enfermedad de [place].  Un prepucio estrecho.  Cncer en los rganos reproductivos masculinos.  Problemas psiclogicos. Esto puede hacer que el dolor empeore. DIAGNSTICO   El mdico har la historia clnica y tendr que describir el lugar donde se Midwife (fuera de la vagina, en la vagina, en la pelvis). Le preguntar en qu momento experimenta el dolor, durante la penetracin o con los choques.  Luego el [employer] har un examen fsico. Hgale saber si sufre dolor durante el examen.  Durante la parte final del examen femenino, su mdico le palpar el tero y los ovarios con la mano sobre el abdomen y un dedo en la vagina. Es un examen plvico.  Le pedir anlisis de sangre, un [place]apan[place], cultivos en busca de infecciones, una ecografa y radiografas. Es posible que necesite ver a un especialista por problemas femeninos (gineclogo).  Su mdico indicar que se haga una tomografa computada, una resonancia magntica o una laparoscopia. La laparoscopia es un procedimiento para examinar la pelvis con un tubo con luz, a travs de un corte (incisin) en el abdomen. TRATAMIENTO  Su mdico determinar el mejor curso de [place]. En algunos casos se solicitan ms pruebas. Contine con los estudios indicados hasta que el mdico est seguro acerca del diagnstico y [place]. A veces, es difcil de Animator causa del dolor. La bsqueda de la causa y el tratamiento pueden ser frustrantes. El tratamiento generalmente demora varias semanas o meses antes de notar [place]. Tambin puede ser necesario evitar la actividad sexual hasta que los sntomas mejoren. Si sigue teniendo Armed forces operational officer cuando tiene dolor puede Lexicographer la curacin y  Patent examiner.  El tratamiento depende de la causa del dolor. Puede incluir:   Medicamentos como antibiticos, cremas vaginales, para la piel, hormonas, o antidepresivos.  Ciruga mayor o menor.  El asesoramiento psicolgico o terapia de [place].  Ejercicios de [place] y dilatadores vaginales para ayudar en ciertos casos de vaginismo (espasmos). Hgalos slo si se lo recomienda su mdico. Los ejercicios Kegel pueden hacer que algunos problemas empeoren.  La aplicacin de un lubricante segn las indicaciones del mdico si tiene sequedad.  Terapia sexual para usted y su compaero. Es comn que el dolor contine despus de tratar la causa. Puede ser por Ardelia Mems respuesta condicionada. Esto significa la persona se vuelve tan familiar con el dolor que persiste como [place], aunque se elimine la causa. La terapia sexual puede ayudar con este problema.  INSTRUCCIONES PARA EL CUIDADO EN EL HOGAR   Siga las instrucciones de su mdico acerca de como [employer]el, las [place], terapias y visitas de control.  No use tampones, duchas vaginales , aerosoles vaginales o jabones perfumados.  Use lubricantes a base de agua para la sequedad. Los lubricantes con aceites pueden causar irritacin.  No utilice espermicidas o condones que Lobbyist.  Hable abiertamente con su pareja sobre su experiencia sexual , sus deseos, el juego previo y las diferentes posiciones sexuales para Ardelia Mems relacin sexual ms cmoda y Materials engineer.  nase a sesiones de [place] de [place], si es necesario.  Practique sexo seguro en todo momento.  Vace su vejiga antes de Clinical biochemist.  Pruebe diferentes posiciones durante el coito.  [employer]  medicamentos de venta libre para Conservation officer, historic buildings segn las indicaciones del mdico, antes de Clinical biochemist.  No use panties. Use las medias que llegan a la altura de la rodilla o del muslo.  Evite frotar la vulva con un pao. Lave suavemente la zona y squela con  una toalla dando golpecitos. SOLICITE ATENCIN MDICA SI:   Tiene hemorragias [place].  Observa un bulto en la abertura de la vagina, aunque no sea doloroso.  Tiene flujo vaginal anormal.  Tiene sequedad vaginal.  Siente tiene picazn o irritacin de la vulva o la vagina.  Aparece una erupcin o tiene una reaccin a los medicamentos. [place] DE INMEDIATO SI:   Siente dolor abdominal intenso durante o poco despus [place]. Usted podra haber sufrido la ruptura de un quiste ovrico o un embarazo ectpico.  Tiene fiebre.  Comienza a Education officer, environmental al orinar u [employer].  Tiene relaciones sexuales dolorosas, y Psychiatrist ocurri antes.  Se desmaya despus de Clinical biochemist. Document Released: 11/19/2010 Document Revised: 06/01/2011 [facility] Patient Information 2015 [place]. This information is not intended to replace advice given to you by your health care provider. Make sure you discuss any questions you have with your health care provider.

## 2014-11-12 NOTE — Progress Notes (Signed)
Here for results. Used interpreterRaquel Mora. C/o for a week has been having " kind of throbbing or stabbing pain mid abdomen =4.

## 2014-11-13 LAB — FOLLICLE STIMULATING HORMONE: FSH: 3.1 m[IU]/mL

## 2014-11-21 ENCOUNTER — Telehealth: Payer: Self-pay | Admitting: *Deleted

## 2014-11-21 NOTE — Telephone Encounter (Signed)
Attempted to contact patient with lab results for TSH, [place] with Spanish Interpreter, Ladoris Gene No answer, left message for patient to return call to clinic.

## 2014-11-21 NOTE — Telephone Encounter (Signed)
Pt returned call to clinic, results for TSH/FSH given with interpreter, pt verbalizes understanding and has no further questions;

## 2015-01-14 ENCOUNTER — Ambulatory Visit: Payer: Self-pay | Admitting: Obstetrics & Gynecology

## 2015-08-26 ENCOUNTER — Encounter: Payer: Self-pay | Admitting: Interventional Cardiology

## 2015-08-26 ENCOUNTER — Ambulatory Visit (INDEPENDENT_AMBULATORY_CARE_PROVIDER_SITE_OTHER): Payer: Self-pay | Admitting: Interventional Cardiology

## 2015-08-26 VITALS — BP 110/80 | HR 72 | Ht 63.0 in | Wt 172.4 lb

## 2015-08-26 DIAGNOSIS — R079 Chest pain, unspecified: Secondary | ICD-10-CM

## 2015-08-26 NOTE — Patient Instructions (Signed)
Medication Instructions:  Your physician recommends that you continue on your current medications as directed. Please refer to the Current Medication list given to you today.   Labwork: None ordered  Testing/Procedures: Your physician has requested that you have an echocardiogram. Echocardiography is a painless test that uses sound waves to create images of your heart. It provides your doctor with information about the size and shape of your heart and how well your heart's chambers and valves are working. This procedure takes approximately one hour. There are no restrictions for this procedure.   Follow-Up: Your physician recommends that you schedule a follow-up appointment pending test results   Any Other Special Instructions Will Be Listed Below (If Applicable).     If you need a refill on your cardiac medications before your next appointment, please call your pharmacy.

## 2015-08-26 NOTE — Progress Notes (Signed)
Cardiology Office Note    Date:  08/26/2015   ID:  Kenyona, Litterio [birth year], MRN [id number]  PCP:  Lance Bosch, NP  Cardiologist: Sinclair Grooms, MD   Chief Complaint  Patient presents with  . Chest Pain    History of Present Illness:  Brooke Lam is a 35 y.o. female for evaluation of chest pain. Interpreter is present.  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 Over the past 8 months she has been bothered by intermittent focal left chest discomfort. When the discomfort occurs if she take slow deep breaths, the discomfort is deceased. More recently the discomfort has become more severe taking on a sharp quality and also leaving the sensation as if someone hit her in the chest. Over the past 2 months she states that she is unable to lie flat on her stomach if the discomfort is present. This makes the discomfort much worse. Episodes can last up to several minutes before resolving. There is no exertional component.    Past Medical History  Diagnosis Date  . Ectopic pregnancy   . Preterm labor     Past Surgical History  Procedure Laterality Date  . Cesarian section    . Cesarean section      C/S x 4  . Abdominal hysterectomy      Current Medications: Outpatient Prescriptions Prior to Visit  Medication Sig Dispense Refill  . medroxyPROGESTERone (PROVERA) 10 MG tablet Take 2 tablets (20 mg total) by mouth daily. (Patient not taking: Reported on 08/26/2015) 30 tablet 2   No facility-administered medications prior to visit.     Allergies:   Review of patient's allergies indicates no known allergies.   Social History   Social History  . Marital Status: Single    Spouse Name: N/A  . Number of Children: N/A  . Years of Education: N/A   Social History Main Topics  . Smoking status: Former Smoker    Types: Cigarettes  . Smokeless tobacco: Never Used     Comment: quit 2005    . Alcohol Use: No  . Drug Use: No  . Sexual Activity: Yes    Birth Control/ Protection: None, Surgical   Other Topics Concern  . None   Social History Narrative     Family History:  The patient's family history includes Diabetes in her father and paternal grandmother. There is no history of Anesthesia problems, Hearing loss, or Other.   ROS:   Please see the history of present illness.    Intermittent left hand numbness. Excessive fatigue, wakes up from sleep short of breath, occasional dizziness and headaches.  All other systems reviewed and are negative.   PHYSICAL EXAM:   VS:  BP 110/80 mmHg  Pulse 72  Ht 5\' 3"  (1.6 m)  Wt 172 lb 6.4 oz (78.2 kg)  BMI 30.55 kg/m2  LMP 10/08/2011   GEN: Well nourished, well developed, in no acute distress HEENT: normal Neck: no JVD, carotid bruits, or masses Cardiac: RRR; no murmurs, rubs, or gallops,no edema  Respiratory:  clear to auscultation bilaterally, normal work of breathing GI: soft, nontender, nondistended, + BS MS: no deformity or atrophy Skin: warm and dry, no rash Neuro:  Alert and Oriented x 3, Strength and sensation are intact Psych: euthymic mood, full affect  Wt Readings from Last 3 Encounters:  08/26/15 172 lb 6.4 oz (78.2  kg)  11/12/14 158 lb (71.668 kg)  10/17/14 157 lb 9.6 oz (71.487 kg)      Studies/Labs Reviewed:   EKG:  EKG  Normal sinus rhythm with normal appearance.  Recent Labs: 11/12/2014: TSH 1.432   Lipid Panel No results found for: CHOL, TRIG, HDL, CHOLHDL, VLDL, LDLCALC, LDLDIRECT  Additional studies/ records that were reviewed today include:  Reviewed and picked/Health Link data which is not helpful.    ASSESSMENT:    1. Chest pain, unspecified chest pain type      PLAN:  In order of problems listed above:  1. The chest discomfort has a musculoskeletal and/or pleuritic nature. There is no clinical suspicion for coronary disease. In her age group if coronary ischemia is a  consideration, perhaps coronary anomaly would be a consideration. At this point we will perform a 2-D Doppler echocardiogram to assess the pericardium, right ventricle, and overall valvular and ventricular function. I reassured her as I believe the discomfort is noncardiac in origin. We will see what the workup shows. If the discomfort continues and no source is found, perhaps a coronary CT angioma would be a consideration.    Medication Adjustments/Labs and Tests Ordered: Current medicines are reviewed at length with the patient today.  Concerns regarding medicines are outlined above.  Medication changes, Labs and Tests ordered today are listed in the Patient Instructions below. Patient Instructions  Medication Instructions:  Your physician recommends that you continue on your current medications as directed. Please refer to the Current Medication list given to you today.   Labwork: None ordered  Testing/Procedures: Your physician has requested that you have an echocardiogram. Echocardiography is a painless test that uses sound waves to create images of your heart. It provides your doctor with information about the size and shape of your heart and how well your heart's chambers and valves are working. This procedure takes approximately one hour. There are no restrictions for this procedure.   Follow-Up: Your physician recommends that you schedule a follow-up appointment pending test results   Any Other Special Instructions Will Be Listed Below (If Applicable).     If you need a refill on your cardiac medications before your next appointment, please call your pharmacy.      Signed, Sinclair Grooms, MD  08/26/2015 10:50 AM    [facility] [place], [place], [place]  [address] Phone: [phone]; Fax: [phone]

## 2015-09-10 ENCOUNTER — Other Ambulatory Visit: Payer: Self-pay

## 2015-09-10 ENCOUNTER — Ambulatory Visit (HOSPITAL_COMMUNITY): Payer: Self-pay | Attending: Cardiology

## 2015-09-10 DIAGNOSIS — R079 Chest pain, unspecified: Secondary | ICD-10-CM | POA: Insufficient documentation

## 2015-09-10 LAB — ECHOCARDIOGRAM COMPLETE
AOASC: 28 cm
CHL CUP DOP CALC LVOT VTI: 20.7 cm
CHL CUP MV DEC (S): 275
CHL CUP RV SYS PRESS: 23 mmHg
CHL CUP STROKE VOLUME: 46 mL
CHL CUP TV REG PEAK VELOCITY: 224 cm/s
E decel time: 275 msec
E/e' ratio: 5.22
FS: 34 % (ref 28–44)
IVS/LV PW RATIO, ED: 0.88
LA ID, A-P, ES: 36 mm
LA diam end sys: 36 mm
LA vol: 46 mL
LADIAMINDEX: 1.9 cm/m2
LAVOLA4C: 45 mL
LAVOLIN: 24.3 mL/m2
LV E/e'average: 5.22
LV PW d: 9.2 mm — AB (ref 0.6–1.1)
LV TDI E'MEDIAL: 10.7
LV dias vol: 76 mL (ref 46–106)
LV sys vol: 30 mL (ref 14–42)
LVDIAVOLIN: 40 mL/m2
LVEEMED: 5.22
LVELAT: 17.2 cm/s
LVOT area: 2.84 cm2
LVOT peak grad rest: 5 mmHg
LVOTD: 19 mm
LVOTPV: 112 cm/s
LVOTSV: 59 mL
LVSYSVOLIN: 16 mL/m2
Lateral S' vel: 13.1 cm/s
MV pk A vel: 68.6 m/s
MV pk E vel: 89.8 m/s
MVPG: 3 mmHg
Simpson's disk: 61
TDI e' lateral: 17.2
TRMAXVEL: 224 cm/s

## 2015-12-19 ENCOUNTER — Other Ambulatory Visit (HOSPITAL_COMMUNITY): Admission: RE | Admit: 2015-12-19 | Payer: Self-pay | Source: Ambulatory Visit | Admitting: Obstetrics & Gynecology

## 2015-12-19 ENCOUNTER — Ambulatory Visit (INDEPENDENT_AMBULATORY_CARE_PROVIDER_SITE_OTHER): Payer: Self-pay | Admitting: Family Medicine

## 2015-12-19 DIAGNOSIS — N390 Urinary tract infection, site not specified: Secondary | ICD-10-CM

## 2015-12-19 DIAGNOSIS — Z113 Encounter for screening for infections with a predominantly sexual mode of transmission: Secondary | ICD-10-CM

## 2015-12-19 LAB — POCT URINALYSIS DIP (DEVICE)
BILIRUBIN URINE: NEGATIVE
GLUCOSE, UA: NEGATIVE mg/dL
KETONES UR: NEGATIVE mg/dL
NITRITE: NEGATIVE
PH: 6.5 (ref 5.0–8.0)
Protein, ur: NEGATIVE mg/dL
Specific Gravity, Urine: 1.02 (ref 1.005–1.030)
Urobilinogen, UA: 0.2 mg/dL (ref 0.0–1.0)

## 2015-12-19 MED ORDER — CEPHALEXIN 500 MG PO CAPS: 500.0000 mg | ORAL_CAPSULE | Freq: Three times a day (TID) | ORAL | 0 refills | Status: DC

## 2015-12-19 MED ORDER — PHENAZOPYRIDINE HCL 200 MG PO TABS: 200.0000 mg | ORAL_TABLET | Freq: Three times a day (TID) | ORAL | 0 refills | Status: DC

## 2015-12-19 MED ORDER — SULFAMETHOXAZOLE-TRIMETHOPRIM 800-160 MG PO TABS: 1.0000 | ORAL_TABLET | Freq: Two times a day (BID) | ORAL | 0 refills | Status: DC

## 2015-12-19 NOTE — Progress Notes (Signed)
C/o urgency with urination but denies burning or pain with urination.  Has has uti in the past.  Would also like chlamydia /gonorrhea checked on urine due to some issues. Given eprescriptions . Patient requested keflex instead of septra because she states this works good for her in past. Order approved by Dr. Harolyn Rutherford.

## 2015-12-20 LAB — GC/CHLAMYDIA PROBE AMP (~~LOC~~) NOT AT ARMC
CHLAMYDIA, DNA PROBE: NEGATIVE
NEISSERIA GONORRHEA: NEGATIVE

## 2015-12-22 LAB — URINE CULTURE

## 2015-12-24 ENCOUNTER — Telehealth: Payer: Self-pay | Admitting: *Deleted

## 2015-12-24 NOTE — Telephone Encounter (Signed)
Spanish-speaking patient phoned to get urine results. Please return her call.

## 2015-12-27 NOTE — Telephone Encounter (Signed)
Called pt with [place] # [phone] and informed her of urine test results showing negative bladder infection and negative for GC/Chlamydia. Pt questioned why she was having the sx if she had no infection and also because now after taking the medication prescribed, all sx are gone. I explained that she could have had the beginning of a bladder infection which did not show up yet in the urine (pt had colony count of 50,000-100,000 Staph saprophyticus). Also she could have had irritation of that area which responded to the prescription. Pt then stated that she wanted to make appt to have her kidneys tested because she keeps having this problem. I advised that there are no tests to be done at this time because all of her sx have stopped. If she has problems in the future, she may call and schedule appt when she is having sx. Pt voiced understanding.

## 2016-02-27 ENCOUNTER — Ambulatory Visit: Payer: Self-pay | Admitting: General Practice

## 2016-02-27 DIAGNOSIS — N898 Other specified noninflammatory disorders of vagina: Secondary | ICD-10-CM

## 2016-02-27 NOTE — Progress Notes (Signed)
Patient reports white itching discharge and an occasional smell. Instructed patient on self wet prep.

## 2016-02-28 LAB — WET PREP, GENITAL
Trich, Wet Prep: NONE SEEN
Yeast Wet Prep HPF POC: NONE SEEN

## 2016-03-09 ENCOUNTER — Telehealth: Payer: Self-pay | Admitting: General Practice

## 2016-03-09 DIAGNOSIS — B9689 Other specified bacterial agents as the cause of diseases classified elsewhere: Secondary | ICD-10-CM

## 2016-03-09 DIAGNOSIS — N760 Acute vaginitis: Principal | ICD-10-CM

## 2016-03-09 MED ORDER — METRONIDAZOLE 500 MG PO TABS
500.0000 mg | ORAL_TABLET | Freq: Two times a day (BID) | ORAL | 0 refills | Status: DC
Start: 2016-03-09 — End: 2016-03-26

## 2016-03-09 NOTE — Telephone Encounter (Signed)
Patient's wet prep + for BV. Called and informed patient of results, what BV was, & medication waiting at pharmacy. Patient verbalized understanding to all & declines interpreter.

## 2016-03-26 ENCOUNTER — Encounter (HOSPITAL_COMMUNITY): Payer: Self-pay | Admitting: Nurse Practitioner

## 2016-03-26 ENCOUNTER — Ambulatory Visit (HOSPITAL_COMMUNITY)
Admission: EM | Admit: 2016-03-26 | Discharge: 2016-03-26 | Disposition: A | Discharge status: Nursing Home (Includes ICF) | Payer: Self-pay | Attending: Emergency Medicine | Admitting: Emergency Medicine

## 2016-03-26 ENCOUNTER — Encounter (HOSPITAL_COMMUNITY): Payer: Self-pay | Admitting: Emergency Medicine

## 2016-03-26 ENCOUNTER — Emergency Department (HOSPITAL_COMMUNITY): Payer: Self-pay

## 2016-03-26 ENCOUNTER — Emergency Department (HOSPITAL_COMMUNITY)
Admission: EM | Admit: 2016-03-26 | Discharge: 2016-03-27 | Disposition: A | Discharge status: Home / Self Care | Payer: Self-pay | Attending: Physician Assistant | Admitting: Physician Assistant

## 2016-03-26 DIAGNOSIS — Z87891 Personal history of nicotine dependence: Secondary | ICD-10-CM | POA: Insufficient documentation

## 2016-03-26 DIAGNOSIS — R519 Headache, unspecified: Secondary | ICD-10-CM

## 2016-03-26 DIAGNOSIS — R51 Headache: Secondary | ICD-10-CM | POA: Insufficient documentation

## 2016-03-26 NOTE — ED Provider Notes (Signed)
[facility] DEPT Provider Note   CSN: [id number] Arrival date & time: 03/26/16  1746     History   Chief Complaint Chief Complaint  Patient presents with  . Headache    HPI Brooke Lam is a 36 y.o. female.  Patient presents with complaint of headache. Patient states that the pain started early this morning around 5:00 when she turned over. She c/o acute pain in the back of her head described as stabbing. This was associated with 3 minutes of vision loss. She has subsequently had blurry vision. Patient denies signs of stroke including: facial droop, slurred speech, aphasia, weakness/numbness in extremities, imbalance/trouble walking. Patient denies any head injuries or accidents. She denies neck pain or pain with movement of her head. No photophobia or phonophobia. She denies any exposures. No one around her has also had a headache. No history of blood clots or estrogen use. She has not taken anything at home. She was at Urgent Care earlier today and was referred to ED for further evaluation.        Past Medical History:  Diagnosis Date  . Ectopic pregnancy   . Preterm labor     Patient Active Problem List   Diagnosis Date Noted  . Dyspareunia 11/12/2014  . S/P emergency cesarean hysterectomy 08/01/2012  . Dysuria 09/17/2011    Past Surgical History:  Procedure Laterality Date  . ABDOMINAL HYSTERECTOMY    . CESAREAN SECTION     C/S x 4  . cesarian section      OB History    Gravida Para Term Preterm AB Living   7 5 3 2 2 4    SAB TAB Ectopic Multiple Live Births   1 0 1 0 4       Home Medications    Prior to Admission medications   Not on File    Family History Family History  Problem Relation Age of Onset  . Diabetes Father   . Diabetes Paternal Grandmother   . Anesthesia problems Neg Hx   . Hearing loss Neg Hx   . Other Neg Hx     Social History Social History  Substance Use Topics  . Smoking status: Former Smoker    Types:  Cigarettes  . Smokeless tobacco: Never Used     Comment: quit 2005  . Alcohol use No     Allergies   Patient has no known allergies.   Review of Systems Review of Systems  Constitutional: Negative for fever.  HENT: Negative for congestion, dental problem, rhinorrhea and sinus pressure.   Eyes: Positive for visual disturbance. Negative for photophobia, discharge and redness.  Respiratory: Negative for shortness of breath.   Cardiovascular: Negative for chest pain.  Gastrointestinal: Positive for nausea. Negative for vomiting.  Musculoskeletal: Negative for gait problem, neck pain and neck stiffness.  Skin: Negative for rash.  Neurological: Positive for headaches. Negative for syncope, speech difficulty, weakness, light-headedness and numbness.  Psychiatric/Behavioral: Negative for confusion.     Physical Exam Updated Vital Signs BP 116/65   Pulse 70   Temp 98.3 F (36.8 C)   Resp 16   LMP 10/08/2011   SpO2 100%   Physical Exam  Constitutional: She is oriented to person, place, and time. She appears well-developed and well-nourished.  HENT:  Head: Normocephalic and atraumatic.  Right Ear: Tympanic membrane, external ear and ear canal normal.  Left Ear: Tympanic membrane, external ear and ear canal normal.  Nose: Nose normal.  Mouth/Throat: Uvula is midline, oropharynx is  clear and moist and mucous membranes are normal.  Eyes: Conjunctivae, EOM and lids are normal. Pupils are equal, round, and reactive to light. Right eye exhibits no nystagmus. Left eye exhibits no nystagmus.  Neck: Normal range of motion. Neck supple.  Cardiovascular: Normal rate and regular rhythm.   Pulmonary/Chest: Effort normal and breath sounds normal.  Abdominal: Soft. There is no tenderness.  Musculoskeletal:       Cervical back: She exhibits normal range of motion, no tenderness and no bony tenderness.  Neurological: She is alert and oriented to person, place, and time. She has normal strength  and normal reflexes. No cranial nerve deficit or sensory deficit. She displays a negative Romberg sign. Coordination and gait normal. GCS eye subscore is 4. GCS verbal subscore is 5. GCS motor subscore is 6.  Skin: Skin is warm and dry.  Psychiatric: She has a normal mood and affect.  Nursing note and vitals reviewed.    ED Treatments / Results   Radiology Ct Head Wo Contrast  Result Date: 03/26/2016 CLINICAL DATA:  Initial evaluation for acute headache. EXAM: CT HEAD WITHOUT CONTRAST TECHNIQUE: Contiguous axial images were obtained from the base of the skull through the vertex without intravenous contrast. COMPARISON:  None available. FINDINGS: Brain: Cerebral volume within normal limits. Gray-white matter differentiation well maintained. No acute intracranial hemorrhage. No evidence for acute large vessel territory infarct. No mass lesion, midline shift, or mass effect. No hydrocephalus. No extra-axial fluid collection. Vascular: No hyperdense vessel. Skull: Scalp soft tissues within normal limits.  Calvarium intact. Sinuses/Orbits: Globes and orbital soft tissues within normal limits. Paranasal sinuses are largely clear. No mastoid effusion. IMPRESSION: Negative head CT.  No acute intracranial process identified. Electronically Signed   By: Jeannine Boga M.D.   On: 03/26/2016 23:08    Procedures Procedures (including critical care time)  Medications Ordered in ED Medications - No data to display   Initial Impression / Assessment and Plan / ED Course  I have reviewed the triage vital signs and the nursing notes.  Pertinent labs & imaging results that were available during my care of the patient were reviewed by me and considered in my medical decision making (see chart for details).  Clinical Course    Patient seen and examined. Patient has a completely normal neuro exam. She is laughing and joking during the exam and is in no apparent distress. Offered medication for pain, she  declines. Will proceed with CT given patient's description and reported vision change.   Vital signs reviewed and are as follows: BP 116/65   Pulse 70   Temp 98.3 F (36.8 C)   Resp 16   LMP 10/08/2011   SpO2 100%   Patient informed of normal CT results. We discussed advanced testing, including lumbar puncture, that would be required to rule out occult bleeding. We discussed that based on her exam, I have exceedingly low suspicion for occult bleed, however there is always a chance. Patient agrees that she feels well and would like to avoid further testing at this time. Discussed that she should return immediately with worsening or changing symptoms. Patient counseled to return if they have recurring worsening severe headache, vision loss, weakness in their arms or legs, slurred speech, trouble walking or talking, confusion, trouble with their balance, or if they have any other concerns. Patient verbalizes understanding and agrees with plan.    Final Clinical Impressions(s) / ED Diagnoses   Final diagnoses:  Acute nonintractable headache, unspecified headache type  Patient with acute headache. Sent to the emergency department for further evaluation. She does c/o severe acute onset HA. However her exam is very reassuring. She has absolutely no neurological deficits. She is laughing and joking with her friend during interview. She jumps up out of bed when testing gait. No pain with movement of head or neck.   At this time, given her exam, feel that risk of bleeding is very low. Discussed risks and benefits of further testing including lumbar puncture with patient and friend. We decided to avoid invasive testing at this time in light of her feeling well with a normal exam.    She seems reliable to return with worsening symptoms.   Patient has a normal complete neurological exam, normal vital signs, normal level of consciousness, no signs of meningismus, is well-appearing/non-toxic appearing, no  signs of trauma.   No dangerous or life-threatening conditions suspected or identified by history, physical exam, and by work-up. No indications for hospitalization identified.     New Prescriptions New Prescriptions   No medications on file     Carlisle Cater, PA-C 03/27/16 0004    Carlisle Cater, PA-C 03/27/16 0004    Courteney Julio Alm, MD 03/27/16 [phone]

## 2016-03-26 NOTE — Discharge Instructions (Signed)
Please read and follow all provided instructions.  Your diagnoses today include:  1. Acute nonintractable headache, unspecified headache type     Tests performed today include:  CT of your head which was normal and did not show any serious cause of your headache  Vital signs. See below for your results today.   Medications:   None  Take any prescribed medications only as directed.  Additional information:  Follow any educational materials contained in this packet.  You are having a headache. No specific cause was found today for your headache. It may have been a migraine or other cause of headache. Stress, anxiety, fatigue, and depression are common triggers for headaches.   Your headache today does not appear to be life-threatening or require hospitalization, but often the exact cause of headaches is not determined in the emergency department. Therefore, follow-up with your doctor is very important to find out what may have caused your headache and whether or not you need any further diagnostic testing or treatment.   Sometimes headaches can appear benign (not harmful), but then more serious symptoms can develop which should prompt an immediate re-evaluation by your doctor or the emergency department.  BE VERY CAREFUL not to take multiple medicines containing Tylenol (also called acetaminophen). Doing so can lead to an overdose which can damage your liver and cause liver failure and possibly death.   Follow-up instructions: Please follow-up with your primary care provider in the next 3 days for further evaluation of your symptoms.   Return instructions:   Please return to the Emergency Department if you experience worsening symptoms.  Return if the medications do not resolve your headache, if it recurs, or if you have multiple episodes of vomiting or cannot keep down fluids.  Return if you have a change from the usual headache.  RETURN IMMEDIATELY IF you:  Develop a sudden,  severe headache  Develop confusion or become poorly responsive or faint  Develop a fever above 100.80F or problem breathing  Have a change in speech, vision, swallowing, or understanding  Develop new weakness, numbness, tingling, incoordination in your arms or legs  Have a seizure  Please return if you have any other emergent concerns.  Additional Information:  Your vital signs today were: BP 116/65    Pulse 70    Temp 98.3 F (36.8 C)    Resp 16    LMP 10/08/2011    SpO2 100%  If your blood pressure (BP) was elevated above 135/85 this visit, please have this repeated by your doctor within one month. --------------

## 2016-03-26 NOTE — ED Provider Notes (Signed)
HPI  SUBJECTIVE:  Brooke Lam is a 36 y.o. female who ordered to be acute onset of a posterior, constant headache described as  the "worst headache of my life". She states that it the pain was maximal at onset. She states that she was unable to see for approximately 3 minutes, but that her vision is improving. She reports persistent blurry vision. She reports dizziness described as lightheadedness which is worse with bending forward. She states that she is eating and drinking well. She reports nausea, but no vomiting. There are no other aggravating or alleviating factors. She has not tried anything for this. No fevers, photophobia, phonophobia, visual changes, nasal congestion, sinus pain/pressure, ear pain, jaw pain, purulent nasal d/c, dental pain,  dysarthria, focal weakness, facial droop, discoordination. No body aches, neck stiffness, rash. No  seizures, syncope. Denies eye strain.  Pt is not pregnant. No h/o HIV.  did not occur during exertion.   PMH neg for migraines,sinus infections aneurysm, HIV, glaucoma, stroke, atrial fibrillation or temporal arteritis. Pt is not on any antiplatelets/anticoagulants. FH: Cousin with a stroke.    Past Medical History:  Diagnosis Date  . Ectopic pregnancy   . Preterm labor     Past Surgical History:  Procedure Laterality Date  . ABDOMINAL HYSTERECTOMY    . CESAREAN SECTION     C/S x 4  . cesarian section      Family History  Problem Relation Age of Onset  . Diabetes Father   . Diabetes Paternal Grandmother   . Anesthesia problems Neg Hx   . Hearing loss Neg Hx   . Other Neg Hx     Social History  Substance Use Topics  . Smoking status: Former Smoker    Types: Cigarettes  . Smokeless tobacco: Never Used     Comment: quit 2005  . Alcohol use No    No current facility-administered medications for this encounter.  No current outpatient prescriptions on file.  No Known Allergies   ROS  As noted in HPI.   Physical  Exam  BP 116/72 (BP Location: Left Arm)   Pulse 84   Temp 98.6 F (37 C) (Oral)   Resp 18   LMP 10/08/2011   SpO2 97%   Constitutional: Well developed, well nourished, no acute distress Eyes: PERRL, EOMI, conjunctiva normal bilaterally. Fundoscopic normal b/l. HENT: Normocephalic, atraumatic,mucus membranes moist, normal dentition.  TM normal b/l. No TMJ tenderness. Normal dentition. No nasal congestion, no sinus tenderness. No temporal artery tenderness.  Neck: no cervical LN - trapezial muscle tenderness. No meningismus Respiratory: normal inspiratory effort Cardiovascular: Normal rate GI:  nondistended skin: No rash, skin intact Musculoskeletal: No edema, no tenderness, no deformities Neurologic: Alert & oriented x 3, CN II-XII intact, romberg neg, finger-> nose, heel-> shin equal b/l, Romberg neg, tandem gait steady Psychiatric: Speech and behavior appropriate   ED Course   Medications - No data to display  No orders of the defined types were placed in this encounter.  No results found for this or any previous visit (from the past 24 hour(s)). No results found.   ED Clinical Impression  Acute nonintractable headache, unspecified headache type  ED Assessment/Plan  Patient has several concerning features of her headache including acute onset, pain worse at its onset and visual changes. This could be a migraine with aura, however, she has never had a headache like this before. Vitals are normal. No evidence of glaucoma, temporal arteritis. She is completely neurologically intact, but think that  she warrants further evaluation in the ED. Feel that patient is stable go via shuttle. Discussed rationale for transfer with patient. She agrees with plan.  No orders of the defined types were placed in this encounter.   *This clinic note was created using Dragon dictation software. Therefore, there may be occasional mistakes despite careful proofreading.  ?   Melynda Ripple, MD 03/26/16 [phone]

## 2016-03-26 NOTE — ED Triage Notes (Signed)
Here for intermittent HA onset 0500 w/intermittent sharp pain  Sx also include: dizziness  States sx increases when she bends over or moves head.   Denies inj/trauma  A&O x4... NAD

## 2016-03-26 NOTE — ED Triage Notes (Signed)
Pt presents with c/o headache. The headache began around 0500 this morning. She describes as a posterior stabbing pain. She reports cough, blurred vision, nausea. She denies head injury, LOC, vomiting. The headache has been persistent since onset but increases and decreases in severity intermittently. She has not tried anything at home for the pain. She has not had headaches in the past. She went ot [facility] for the headache and they referred her to ED for further evaluation.

## 2016-03-26 NOTE — Discharge Instructions (Signed)
You have several concerning features of your headache, so I am sending you to the ED to make sure that it is not an emergency. Let them know if her headache changes, gets worse, if you have trouble talking, arm or leg weakness.

## 2016-03-27 ENCOUNTER — Ambulatory Visit: Payer: Self-pay

## 2016-04-20 IMAGING — CR DG HAND COMPLETE 3+V*L*
3 series · 3 of 3 positions shown · non-contrast
Comparison: None.

CLINICAL DATA: Per pt: limited english. Patient fell off the porch
earlier today, trying to break the fall, caught a shovel with the
left hand. There is an open wound Ramalabasov. Patient pointed to the AP
and medial/ulnar left hand. The wound extends to the medial aspect
of the left hand. Patient is not a diabetic. No prior injury to the
left hand.

EXAM:
LEFT HAND - COMPLETE 3+ VIEW

[hand ap]
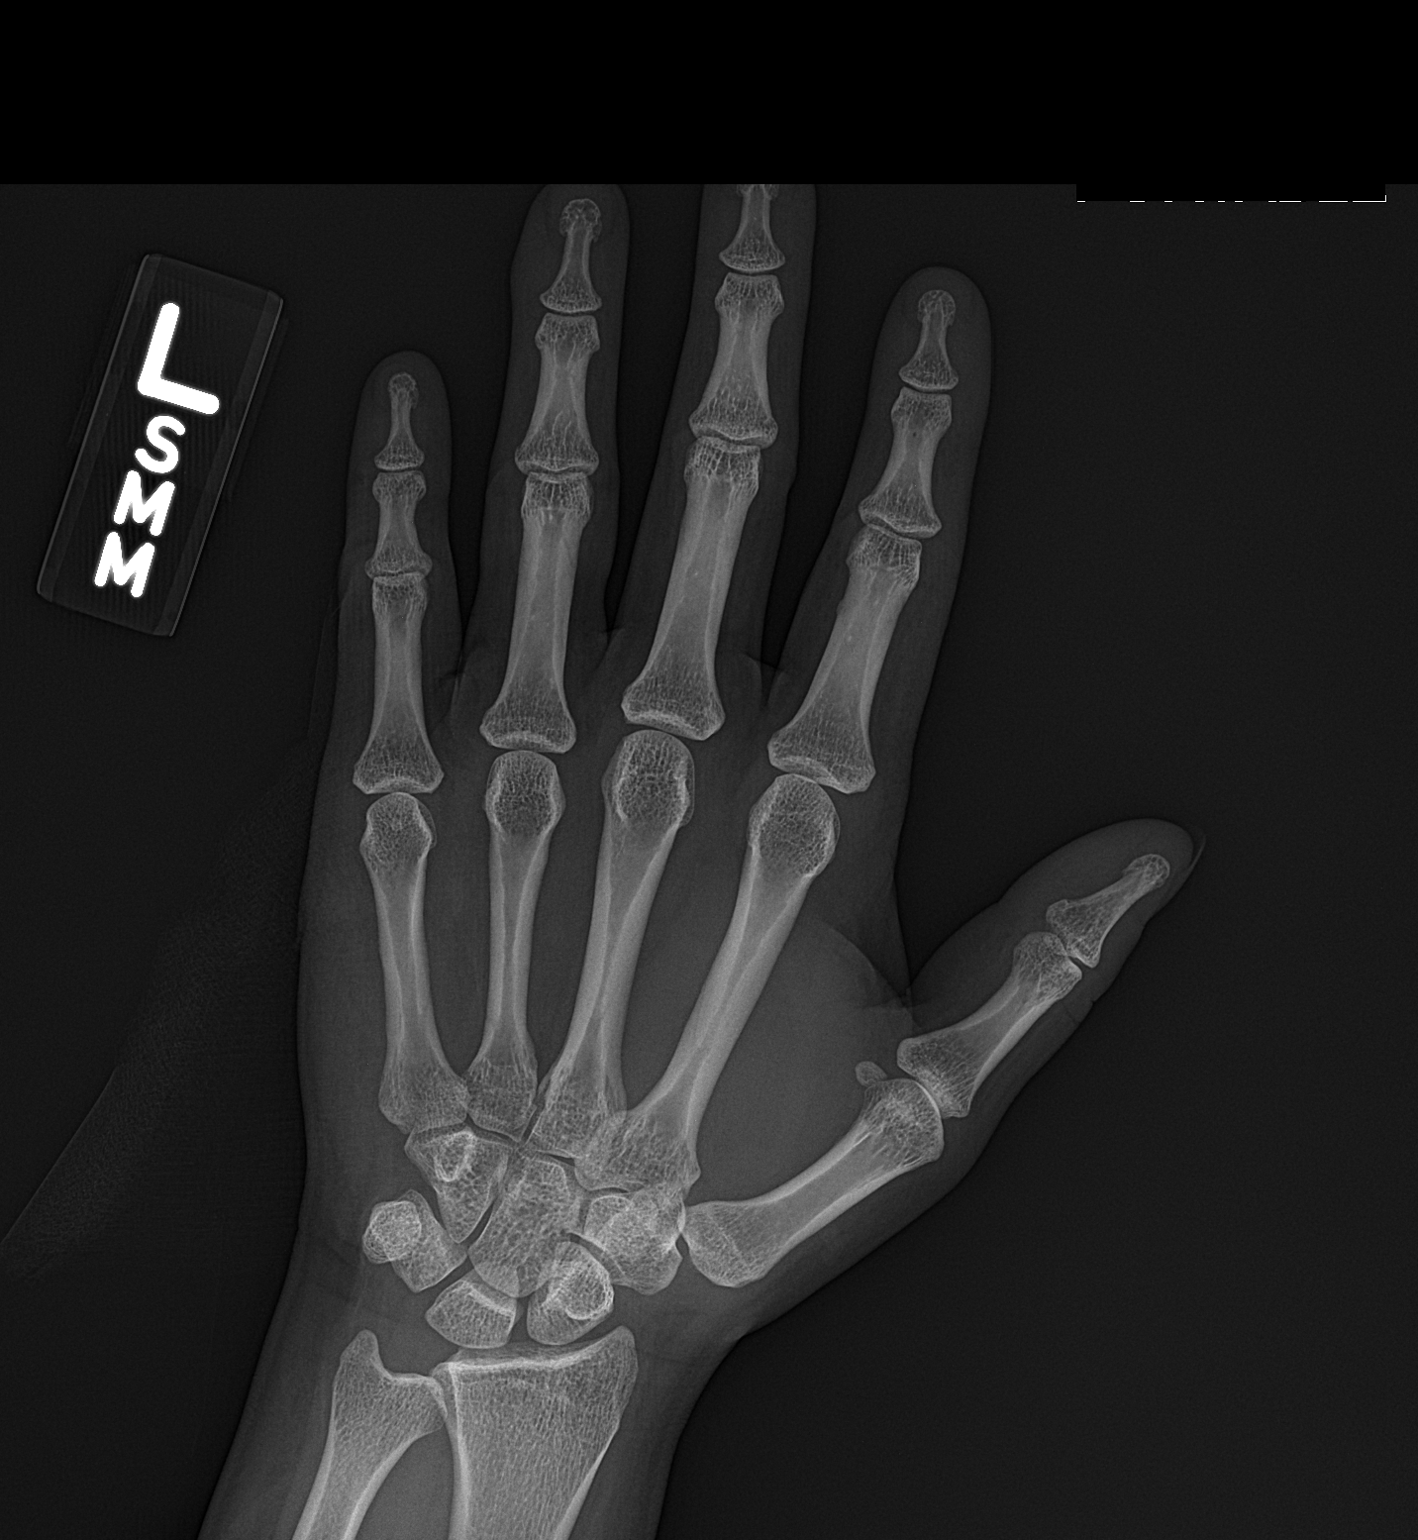

[hand lat]
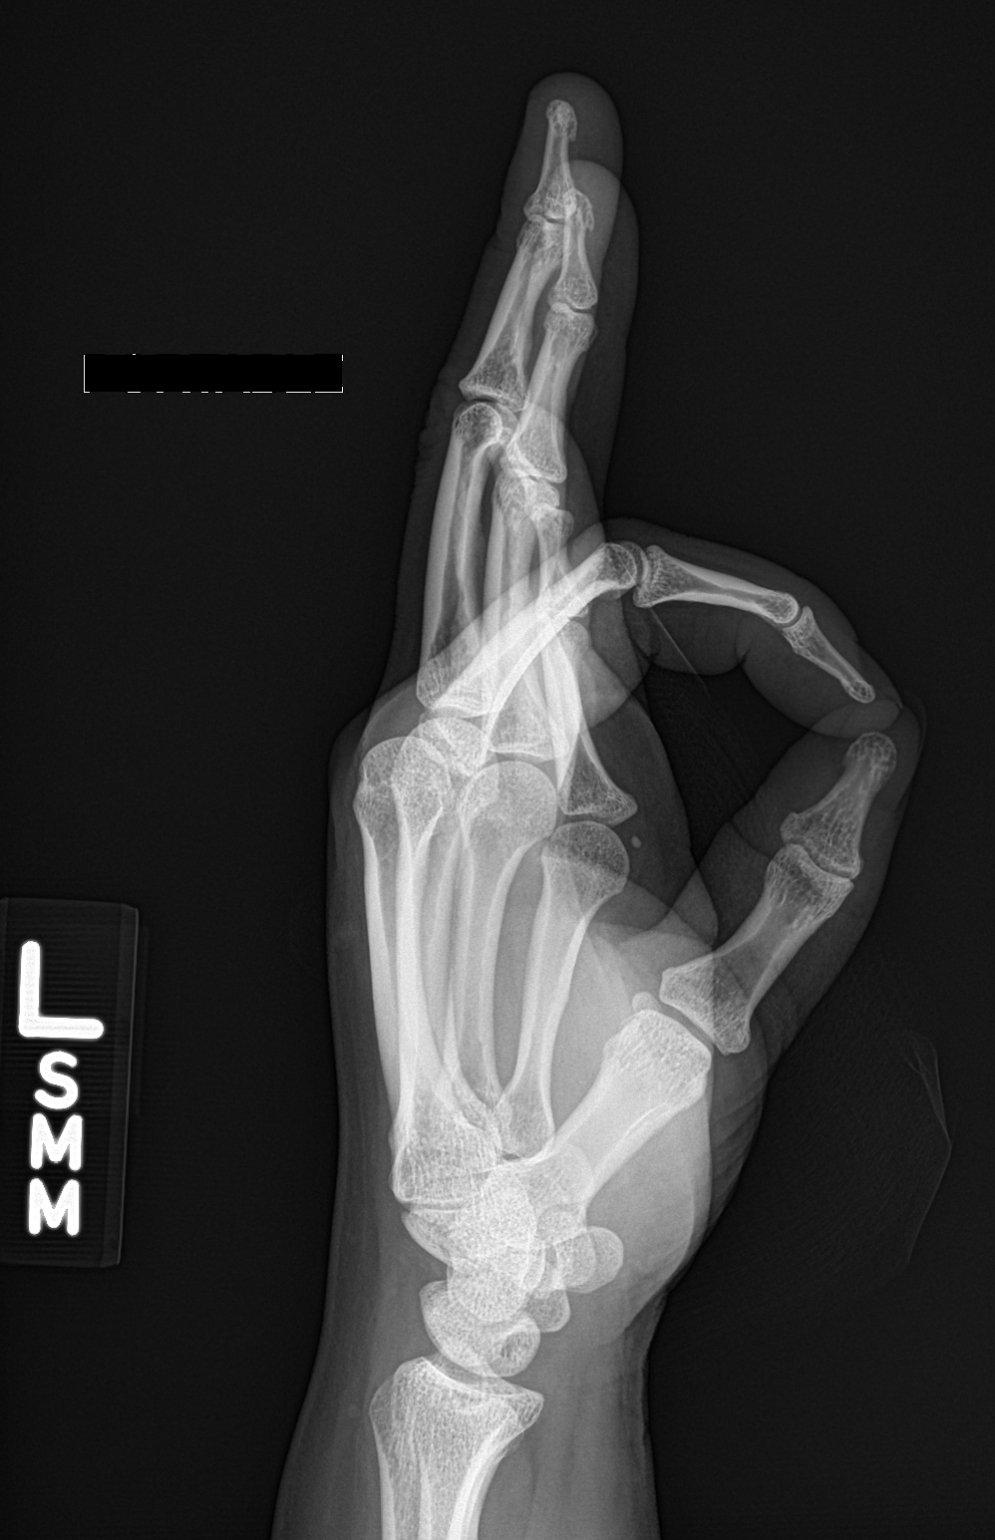

[hand obl]
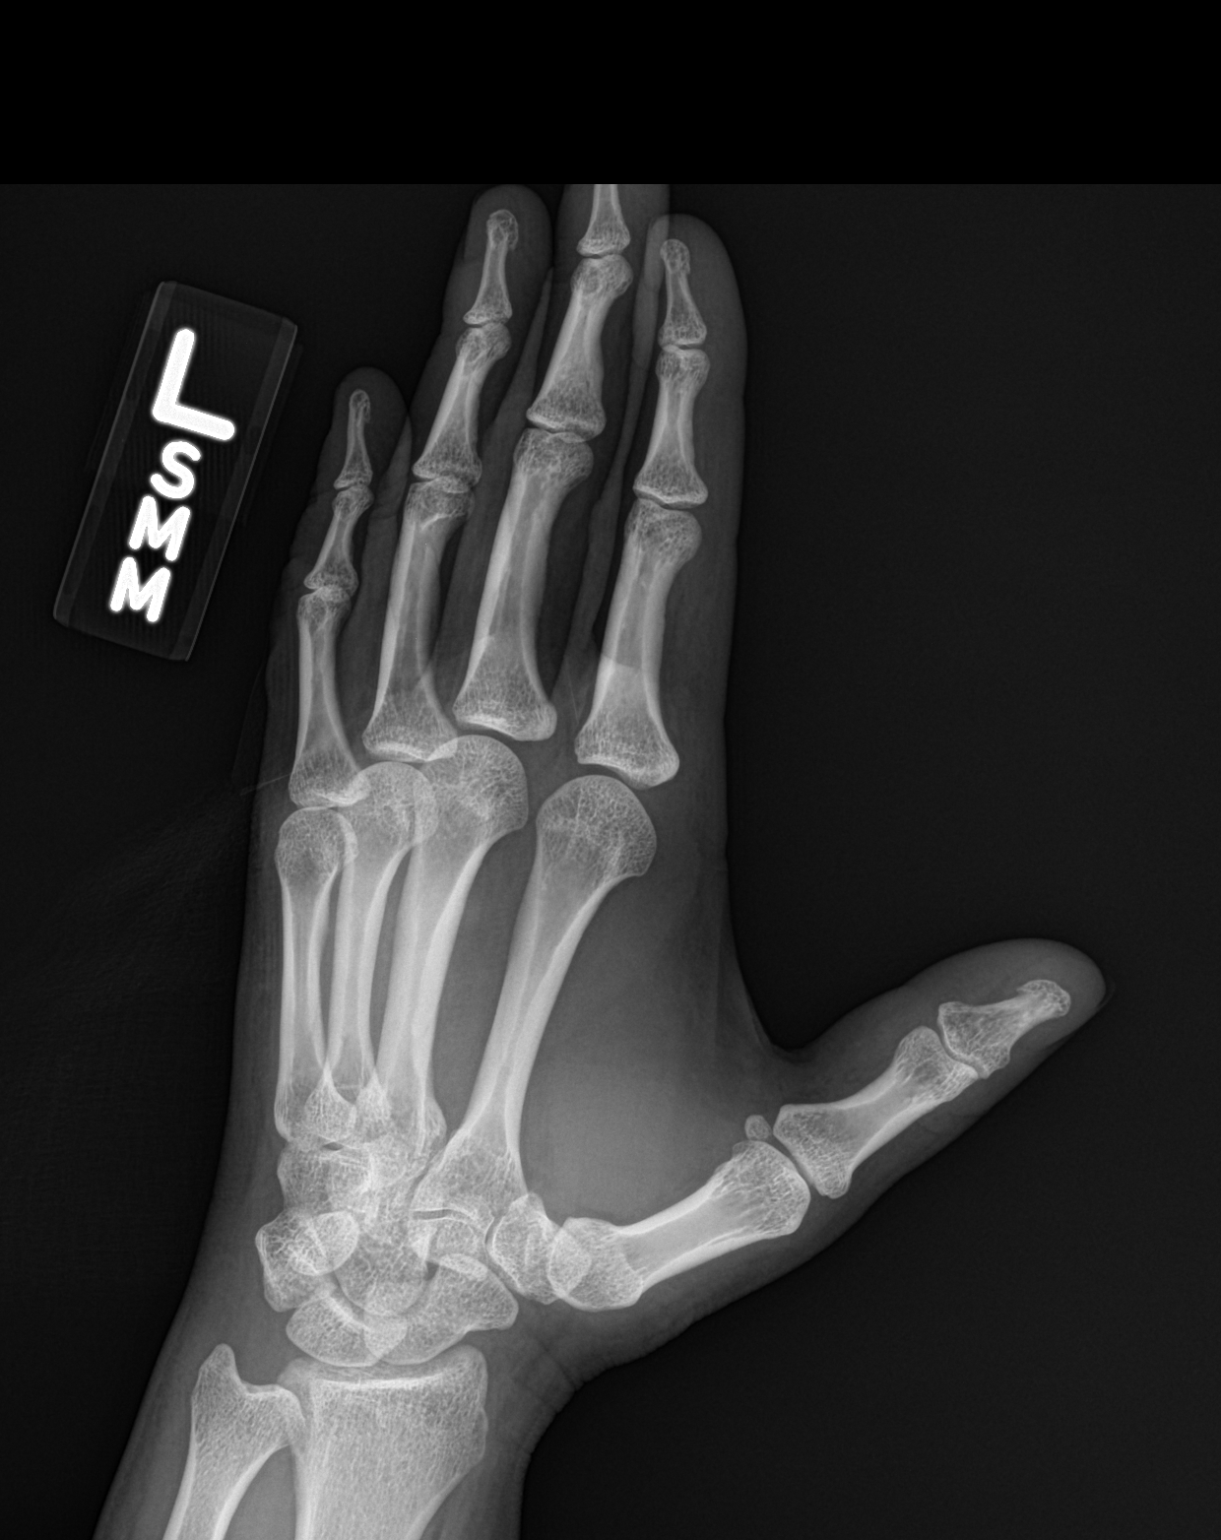

[3 of 3 positions shown; findings below may reference images not displayed]

FINDINGS: Mild ulnar minus variance.  There is no acute fracture dislocation.
IMPRESSION: No acute findings.

## 2016-04-20 IMAGING — CR DG ELBOW COMPLETE 3+V*L*
4 series · 4 of 4 positions shown · non-contrast
Comparison: None.

CLINICAL DATA: Fell from porch

EXAM:
LEFT ELBOW - COMPLETE 3+ VIEW

[elbow ap]
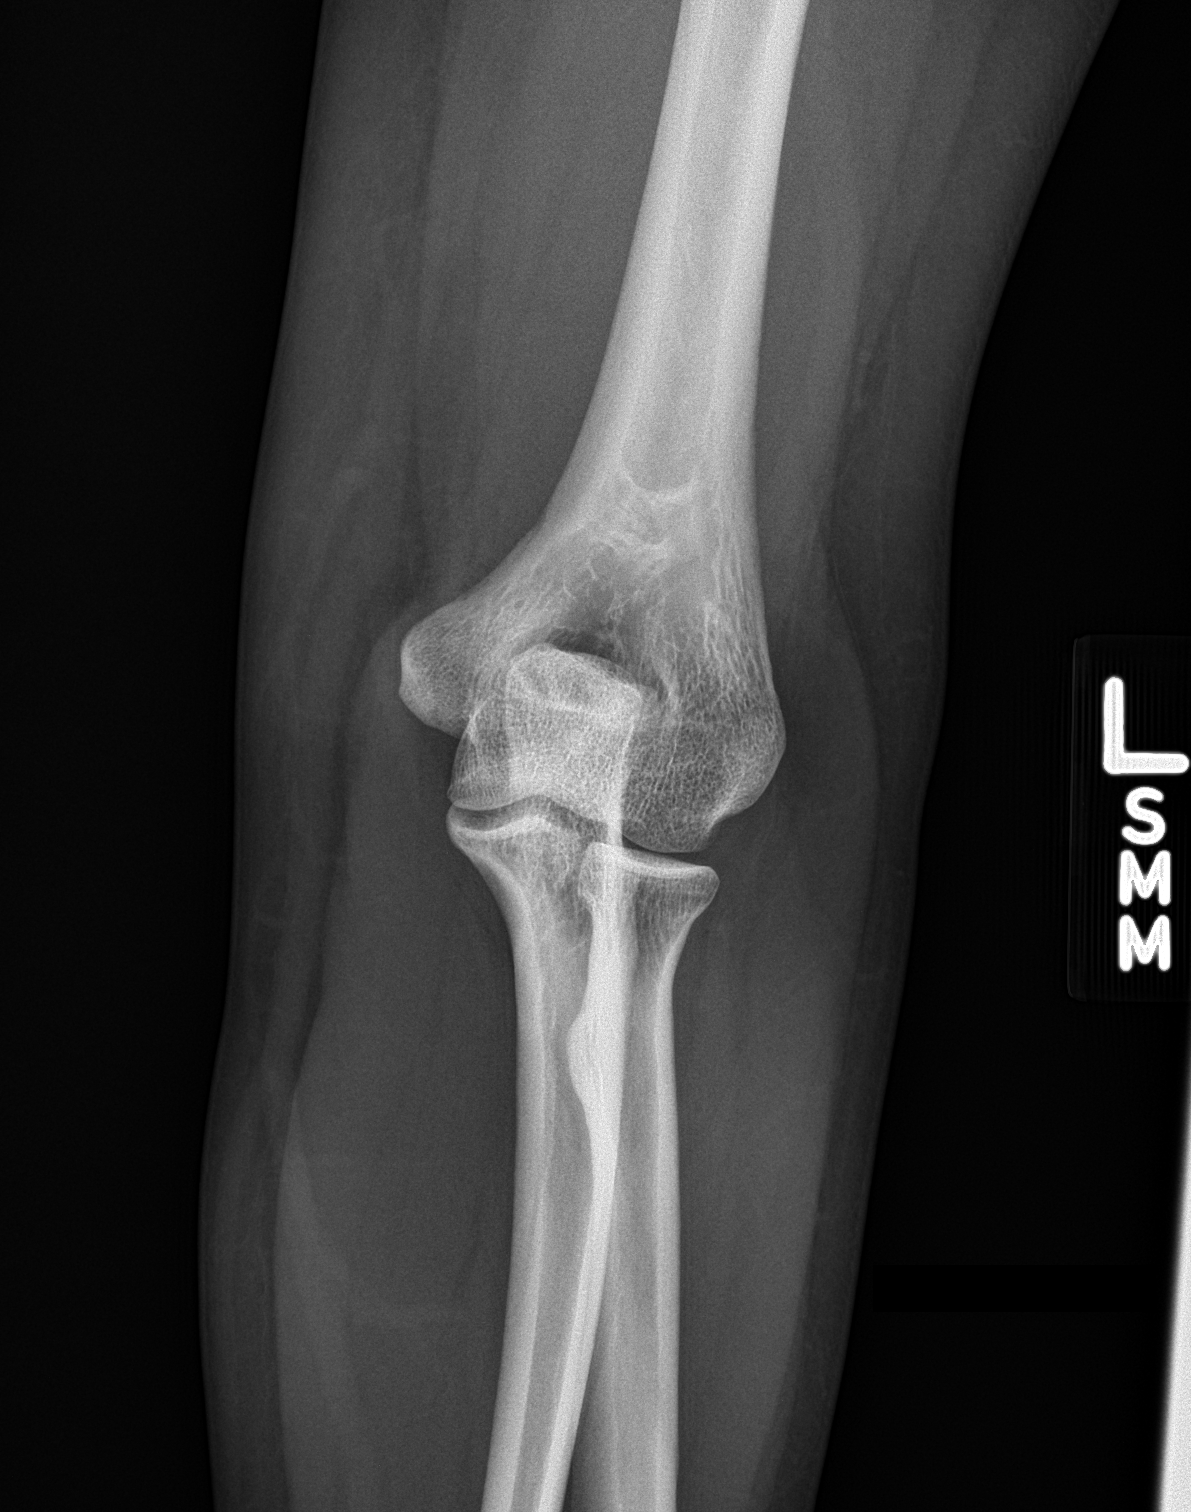

[elbow lat]
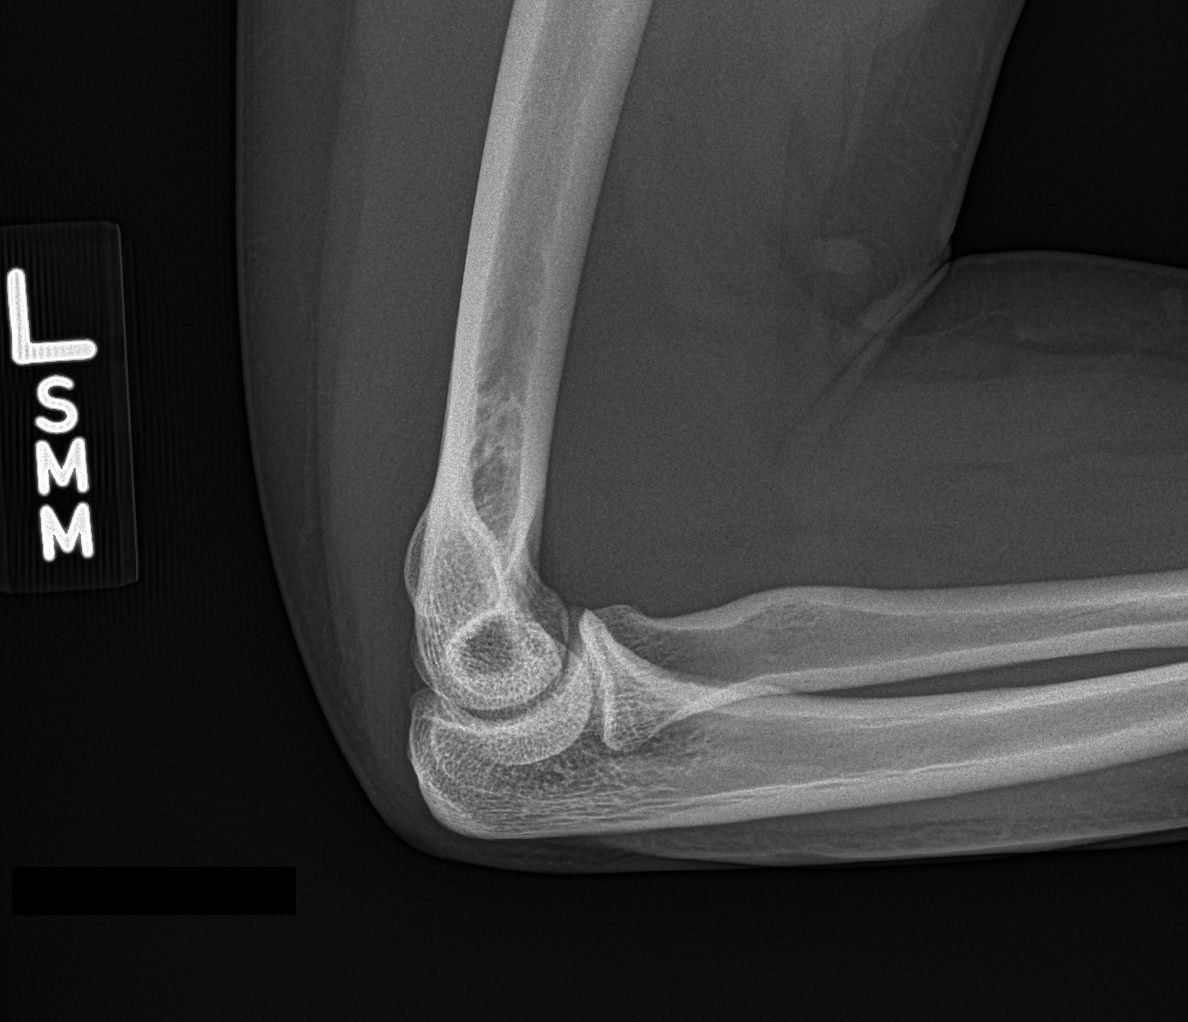

[elbow obl (1 of 2)]
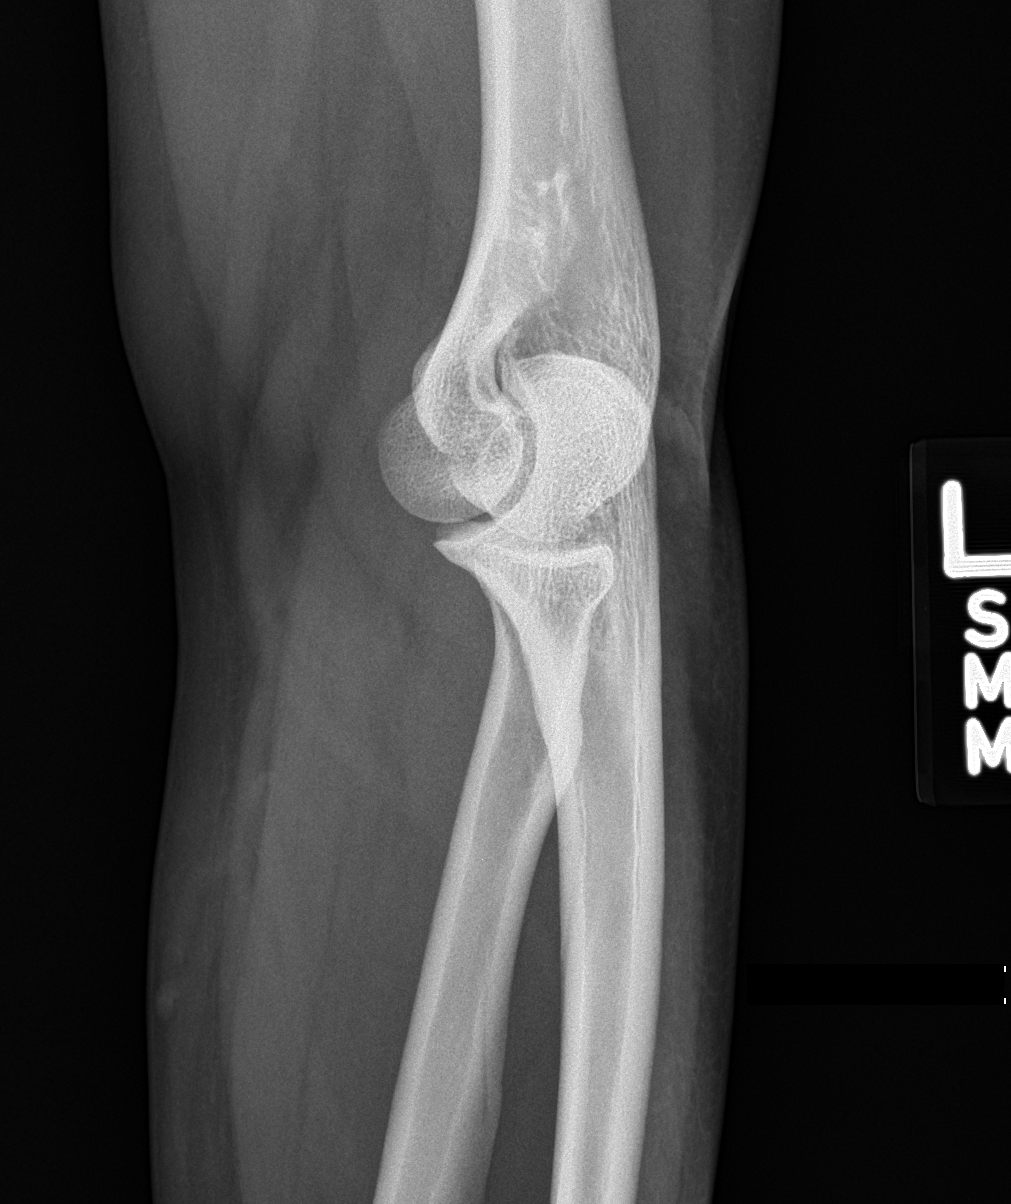

[elbow obl (2 of 2)]
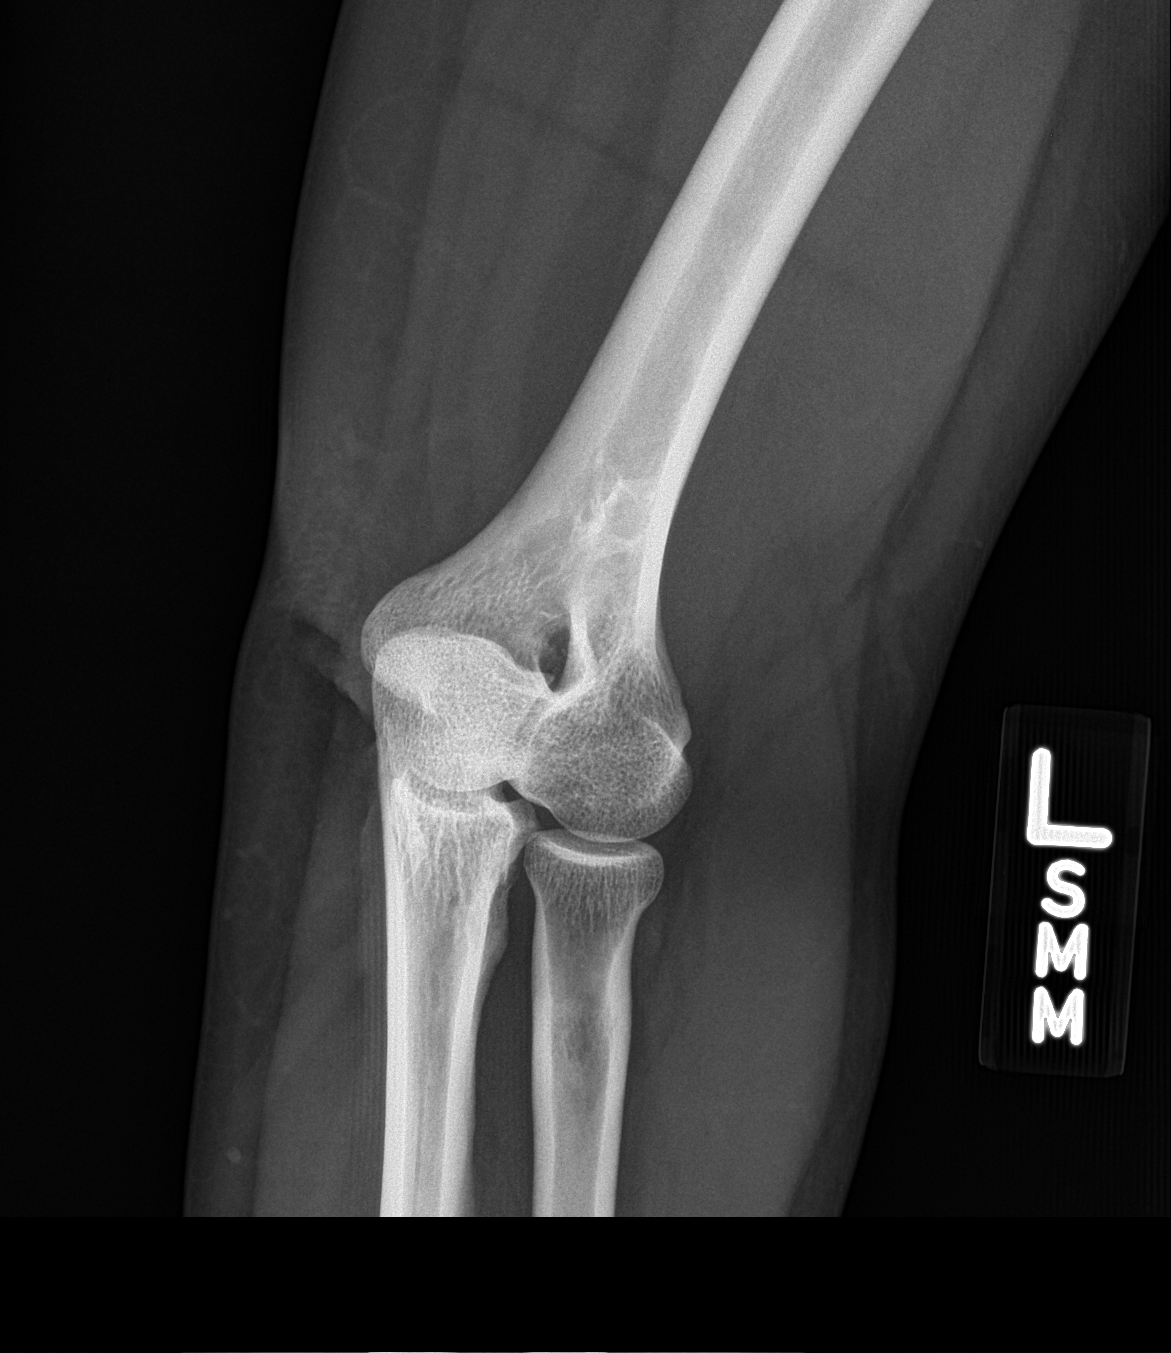

[4 of 4 positions shown; findings below may reference images not displayed]

FINDINGS: There is no evidence of fracture, dislocation, or joint effusion.
There is no evidence of arthropathy or other focal bone abnormality.
Soft tissues are unremarkable.
IMPRESSION: Negative.

## 2016-08-05 ENCOUNTER — Ambulatory Visit (HOSPITAL_COMMUNITY)
Admission: EM | Admit: 2016-08-05 | Discharge: 2016-08-05 | Disposition: A | Discharge status: Home / Self Care | Payer: Self-pay | Attending: Family Medicine | Admitting: Family Medicine

## 2016-08-05 ENCOUNTER — Encounter (HOSPITAL_COMMUNITY): Payer: Self-pay | Admitting: Emergency Medicine

## 2016-08-05 DIAGNOSIS — Z87891 Personal history of nicotine dependence: Secondary | ICD-10-CM | POA: Insufficient documentation

## 2016-08-05 DIAGNOSIS — Z833 Family history of diabetes mellitus: Secondary | ICD-10-CM | POA: Insufficient documentation

## 2016-08-05 DIAGNOSIS — N760 Acute vaginitis: Secondary | ICD-10-CM | POA: Insufficient documentation

## 2016-08-05 DIAGNOSIS — S8002XA Contusion of left knee, initial encounter: Secondary | ICD-10-CM

## 2016-08-05 DIAGNOSIS — B9689 Other specified bacterial agents as the cause of diseases classified elsewhere: Secondary | ICD-10-CM | POA: Insufficient documentation

## 2016-08-05 LAB — POCT URINALYSIS DIP (DEVICE)
BILIRUBIN URINE: NEGATIVE
Glucose, UA: NEGATIVE mg/dL
Ketones, ur: NEGATIVE mg/dL
LEUKOCYTES UA: NEGATIVE
Nitrite: NEGATIVE
Protein, ur: NEGATIVE mg/dL
SPECIFIC GRAVITY, URINE: 1.01 (ref 1.005–1.030)
UROBILINOGEN UA: 0.2 mg/dL (ref 0.0–1.0)
pH: 5.5 (ref 5.0–8.0)

## 2016-08-05 MED ORDER — METRONIDAZOLE 500 MG PO TABS: 500.0000 mg | ORAL_TABLET | Freq: Two times a day (BID) | ORAL | 0 refills | Status: DC

## 2016-08-05 MED ORDER — NAPROXEN 500 MG PO TABS: 500.0000 mg | ORAL_TABLET | Freq: Two times a day (BID) | ORAL | 0 refills | Status: DC

## 2016-08-05 NOTE — ED Triage Notes (Signed)
Pt c/o left knee pani onset 15 days .... Reports she tripped and fell onto concrete flooring  Pain increases when she bends knee  Also c/o foul vaginal smell onset yest  Denies vaginal itching/discharge, urinary sx  Sexually active and does not use condoms.

## 2016-08-05 NOTE — ED Provider Notes (Signed)
CSN: [id number]     Arrival date & time 08/05/16  1403 History   None    Chief Complaint  Patient presents with  . Leg Pain  . Vaginitis   (Consider location/radiation/quality/duration/timing/severity/associated sxs/prior Treatment) Patient c/o leg pain and Urinary sx's.  She states she is having some fishy odor in her vaginal area and not really having any discharge.  Denies any pelvic pain.  She c/o left knee discomfort after falling and hitting left knee on concrete.   The history is provided by the patient.  Leg Pain  Location:  Knee Injury: yes   Mechanism of injury: amputation and fall   Fall:    Impact surface:  Concrete   Point of impact:  Knees   Entrapped after fall: no   Knee location:  L knee Pain details:    Quality:  Aching   Radiates to:  Does not radiate   Onset quality:  Sudden Tetanus status:  Unknown Urinary Tract Infection    Past Medical History:  Diagnosis Date  . Ectopic pregnancy   . Preterm labor    Past Surgical History:  Procedure Laterality Date  . ABDOMINAL HYSTERECTOMY    . CESAREAN SECTION     C/S x 4  . cesarian section     Family History  Problem Relation Age of Onset  . Diabetes Father   . Diabetes Paternal Grandmother   . Anesthesia problems Neg Hx   . Hearing loss Neg Hx   . Other Neg Hx    Social History  Substance Use Topics  . Smoking status: Former Smoker    Types: Cigarettes  . Smokeless tobacco: Never Used     Comment: quit 2005  . Alcohol use No   OB History    Gravida Para Term Preterm AB Living   7 5 3 2 2 4    SAB TAB Ectopic Multiple Live Births   1 0 1 0 4     Review of Systems  Constitutional: Negative.   HENT: Negative.   Eyes: Negative.   Cardiovascular: Negative.   Gastrointestinal: Negative.   Endocrine: Negative.   Musculoskeletal: Positive for arthralgias.  Allergic/Immunologic: Negative.   Neurological: Negative.   Hematological: Negative.   Psychiatric/Behavioral: Negative.      Allergies  Patient has no known allergies.  Home Medications   Prior to Admission medications   Medication Sig Start Date End Date Taking? Authorizing Provider  metroNIDAZOLE (FLAGYL) 500 MG tablet Take 1 tablet (500 mg total) by mouth 2 (two) times daily. 08/05/16   Lysbeth Penner, FNP  naproxen (NAPROSYN) 500 MG tablet Take 1 tablet (500 mg total) by mouth 2 (two) times daily with a meal. 08/05/16   [place], Orson Ape, FNP   Meds Ordered and Administered this Visit  Medications - No data to display  BP 112/78 (BP Location: Left Arm)   Pulse 71   Temp 98.6 F (37 C) (Oral)   Resp 20   LMP 10/08/2011   SpO2 100%  No data found.   Physical Exam  Constitutional: She appears well-developed and well-nourished.  HENT:  Head: Normocephalic and atraumatic.  Eyes: Conjunctivae and EOM are normal. Pupils are equal, round, and reactive to light.  Neck: Normal range of motion. Neck supple.  Cardiovascular: Normal rate, regular rhythm and normal heart sounds.   Pulmonary/Chest: Effort normal and breath sounds normal.  Abdominal: Soft. Bowel sounds are normal.  Musculoskeletal: She exhibits tenderness.  TTP left knee cap  Skin: Rash  noted.  TTP left knee cap.  Nursing note and vitals reviewed.   Urgent Care Course     Procedures (including critical care time)  Labs Review Labs Reviewed  POCT URINALYSIS DIP (DEVICE) - Abnormal; Notable for the following:       Result Value   Hgb urine dipstick TRACE (*)    All other components within normal limits  CERVICOVAGINAL ANCILLARY ONLY    Imaging Review No results found.   Visual Acuity Review  Right Eye Distance:   Left Eye Distance:   Bilateral Distance:    Right Eye Near:   Left Eye Near:    Bilateral Near:         MDM   1. Contusion of left knee, initial encounter   2. BV (bacterial vaginosis)    Naprosyn 500mg  one po bid x 7 days #14 Flagyl 500mg  one po bid x 7 days     Lysbeth Penner,  [place] 08/05/16 2022

## 2016-08-06 LAB — CERVICOVAGINAL ANCILLARY ONLY
Bacterial vaginitis: POSITIVE — AB
Candida vaginitis: NEGATIVE
Chlamydia: NEGATIVE
Neisseria Gonorrhea: NEGATIVE
Trichomonas: NEGATIVE

## 2018-03-02 ENCOUNTER — Other Ambulatory Visit: Payer: Self-pay

## 2018-03-02 ENCOUNTER — Encounter (HOSPITAL_COMMUNITY): Payer: Self-pay | Admitting: *Deleted

## 2018-03-02 ENCOUNTER — Emergency Department (HOSPITAL_COMMUNITY): Payer: Self-pay

## 2018-03-02 ENCOUNTER — Emergency Department (HOSPITAL_COMMUNITY)
Admission: EM | Admit: 2018-03-02 | Discharge: 2018-03-02 | Disposition: A | Discharge status: Home / Self Care | Payer: Self-pay | Attending: Emergency Medicine | Admitting: Emergency Medicine

## 2018-03-02 DIAGNOSIS — Z87891 Personal history of nicotine dependence: Secondary | ICD-10-CM | POA: Insufficient documentation

## 2018-03-02 DIAGNOSIS — R0789 Other chest pain: Secondary | ICD-10-CM | POA: Insufficient documentation

## 2018-03-02 DIAGNOSIS — Z79899 Other long term (current) drug therapy: Secondary | ICD-10-CM | POA: Insufficient documentation

## 2018-03-02 LAB — BASIC METABOLIC PANEL
ANION GAP: 10 (ref 5–15)
BUN: 15 mg/dL (ref 6–20)
CALCIUM: 9.2 mg/dL (ref 8.9–10.3)
CO2: 26 mmol/L (ref 22–32)
Chloride: 103 mmol/L (ref 98–111)
Creatinine, Ser: 0.64 mg/dL (ref 0.44–1.00)
GLUCOSE: 127 mg/dL — AB (ref 70–99)
Potassium: 3.9 mmol/L (ref 3.5–5.1)
SODIUM: 139 mmol/L (ref 135–145)

## 2018-03-02 LAB — CBC
HCT: 39.1 % (ref 36.0–46.0)
HEMOGLOBIN: 12.5 g/dL (ref 12.0–15.0)
MCH: 26.9 pg (ref 26.0–34.0)
MCHC: 32 g/dL (ref 30.0–36.0)
MCV: 84.1 fL (ref 80.0–100.0)
NRBC: 0 % (ref 0.0–0.2)
Platelets: 268 10*3/uL (ref 150–400)
RBC: 4.65 MIL/uL (ref 3.87–5.11)
RDW: 13.2 % (ref 11.5–15.5)
WBC: 6.9 10*3/uL (ref 4.0–10.5)

## 2018-03-02 LAB — I-STAT TROPONIN, ED
TROPONIN I, POC: 0 ng/mL (ref 0.00–0.08)
Troponin i, poc: 0 ng/mL (ref 0.00–0.08)

## 2018-03-02 LAB — D-DIMER, QUANTITATIVE: D-Dimer, Quant: 0.62 ug/mL-FEU — ABNORMAL HIGH (ref 0.00–0.50)

## 2018-03-02 MED ORDER — IOPAMIDOL (ISOVUE-370) INJECTION 76%
100.0000 mL | Freq: Once | INTRAVENOUS | Status: AC | PRN
  Administered 2018-03-02: 74 mL via INTRAVENOUS

## 2018-03-02 MED ORDER — IOPAMIDOL (ISOVUE-370) INJECTION 76%
INTRAVENOUS | Status: AC
  Filled 2018-03-02: qty 100

## 2018-03-02 NOTE — ED Notes (Signed)
Lab notified this RN that they did not have a blue top.  EMT stated she sent blue top 15 min prior.  Lab then stated blue top could be stuck in tube system and to call back in 15 min.

## 2018-03-02 NOTE — ED Notes (Signed)
Patient transported to CT 

## 2018-03-02 NOTE — ED Triage Notes (Signed)
Pt in c/o chest pain that started this morning, she has had this happen before but it has not lasted this long, feels tight when she takes a deep breath, no distress noted

## 2018-03-02 NOTE — Discharge Instructions (Addendum)
The findings on the CT of your chest today showed incidental findings mentioned below. Hepatic steatosis. Focus of hyperenhancement in the right hepatic lobe, possibly hemangioma or transient hepatic attenuation difference (THAD). Nonemergent hepatic ultrasound may be helpful for further Characterization. These are findings that you will have to discuss with your doctor at your next appointment but are not emergent at this time.  You will need to establish care with a primary care provider listed below. Return to ED for worsening symptoms, coughing up blood, severe chest pain or trouble breathing, lightheadedness or loss of consciousness.    Los hallazgos en la tomografa computarizada de su pecho hoy mostraron hallazgos incidentales mencionados a continuacin. Esteatosis heptica. Enfoque de la hipermejora en el lbulo heptico derecho, posiblemente hemangioma o diferencia de atenuacin heptica transitoria (THAD). La ecografa heptica no emergente puede ser til para Product/process development scientist. Estos son los [employer] tendr que discutir con su mdico en su prxima cita, pero no son Futures trader.  Usted Financial planner atencin con un proveedor de atencin primaria que se enumera a continuacin. Regrese a la disfuncin real para empeorar los sntomas, toser sangre, Social research officer, government torcico intenso o dificultad para Ambulance person, aturdimiento o prdida del conocimiento.

## 2018-03-02 NOTE — ED Provider Notes (Signed)
[facility] Provider Note   CSN: [id number] Arrival date & time: 03/02/18  1403     History   Chief Complaint Chief Complaint  Patient presents with  . Chest Pain    HPI Brooke Lam is a 37 y.o. female who presents to ED for 9-hour history of waxing and waning left-sided chest pain.  Describes the pain as sharp, will radiate to her left arm and back, worse with inspiration and associated with shortness of breath.  She has had similar symptoms in the past but not as severe and did not last this long.  States that she also had an episode where she got diaphoretic.  Pain woke her up from her sleep this morning.  She has not tried any medications to help with her symptoms.  Denies any abdominal pain, vomiting, fever, cough or URI symptoms, recent immobilization, history of cancer, tobacco, drug use.  She reports occasional alcohol use.  Denies any family history of CAD. States that she has seen a cardiologist in the past, she believes in 2017, due to this chest pain.  HPI  Past Medical History:  Diagnosis Date  . Ectopic pregnancy   . Preterm labor     Patient Active Problem List   Diagnosis Date Noted  . Dyspareunia 11/12/2014  . S/P emergency cesarean hysterectomy 08/01/2012  . Dysuria 09/17/2011    Past Surgical History:  Procedure Laterality Date  . ABDOMINAL HYSTERECTOMY    . CESAREAN SECTION     C/S x 4  . cesarian section       OB History    Gravida  7   Para  5   Term  3   Preterm  2   AB  2   Living  4     SAB  1   TAB  0   Ectopic  1   Multiple  0   Live Births  4            Home Medications    Prior to Admission medications   Medication Sig Start Date End Date Taking? Authorizing Provider  Multiple Vitamin (MULTIVITAMIN WITH MINERALS) TABS tablet Take 1 tablet by mouth daily.   Yes [provider]  naproxen (NAPROSYN) 500 MG tablet Take 1 tablet (500 mg total) by mouth 2  (two) times daily with a meal. Patient not taking: Reported on 03/02/2018 08/05/16   Lysbeth Penner, FNP    Family History Family History  Problem Relation Age of Onset  . Diabetes Father   . Diabetes Paternal Grandmother   . Anesthesia problems Neg Hx   . Hearing loss Neg Hx   . Other Neg Hx     Social History Social History   Tobacco Use  . Smoking status: Former Smoker    Types: Cigarettes  . Smokeless tobacco: Never Used  . Tobacco comment: quit 2005  Substance Use Topics  . Alcohol use: No    Alcohol/week: 0.0 standard drinks  . Drug use: No     Allergies   Patient has no known allergies.   Review of Systems Review of Systems  Constitutional: Negative for appetite change, chills and fever.  HENT: Negative for ear pain, rhinorrhea, sneezing and sore throat.   Eyes: Negative for photophobia and visual disturbance.  Respiratory: Positive for shortness of breath. Negative for cough, chest tightness and wheezing.   Cardiovascular: Positive for chest pain. Negative for palpitations.  Gastrointestinal: Negative for abdominal pain, blood  in stool, constipation, diarrhea, nausea and vomiting.  Genitourinary: Negative for dysuria, hematuria and urgency.  Musculoskeletal: Negative for myalgias.  Skin: Negative for rash.  Neurological: Negative for dizziness, weakness and light-headedness.     Physical Exam Updated Vital Signs BP 126/86 (BP Location: Right Arm)   Pulse 90   Temp 98 F (36.7 C) (Oral)   Resp 20   LMP 10/08/2011   SpO2 100%   Physical Exam  Constitutional: She appears well-developed and well-nourished. No distress.  HENT:  Head: Normocephalic and atraumatic.  Nose: Nose normal.  Eyes: Conjunctivae and EOM are normal. Left eye exhibits no discharge. No scleral icterus.  Neck: Normal range of motion. Neck supple.  Cardiovascular: Normal rate, regular rhythm, normal heart sounds and intact distal pulses. Exam reveals no gallop and no friction  rub.  No murmur heard. Pulmonary/Chest: Effort normal and breath sounds normal. No respiratory distress.  Abdominal: Soft. Bowel sounds are normal. She exhibits no distension. There is no tenderness. There is no guarding.  Musculoskeletal: Normal range of motion. She exhibits no edema.  No lower extremity edema, erythema or calf tenderness bilaterally.  Neurological: She is alert. She exhibits normal muscle tone. Coordination normal.  Skin: Skin is warm and dry. No rash noted.  Psychiatric: She has a normal mood and affect.  Nursing note and vitals reviewed.    ED Treatments / Results  Labs (all labs ordered are listed, but only abnormal results are displayed) Labs Reviewed  BASIC METABOLIC PANEL - Abnormal; Notable for the following components:      Result Value   Glucose, Bld 127 (*)    All other components within normal limits  D-DIMER, QUANTITATIVE (NOT AT [facility]) - Abnormal; Notable for the following components:   D-Dimer, Quant 0.62 (*)    All other components within normal limits  CBC  I-STAT TROPONIN, ED  I-STAT TROPONIN, ED    EKG  Normal sinus rhythm Normal ECG 48mm/s 22mm/mV 100Hz  9.0.4 12SL 241 HD CID: 45 Unconfirmed Vent. rate 89 BPM PR interval 118 ms QRS duration 78 ms QT/QTc 330/401 ms P-R-T axes 57 86 48  Radiology Dg Chest 2 View  Result Date: 03/02/2018 CLINICAL DATA:  Chest pain EXAM: CHEST - 2 VIEW COMPARISON:  None. FINDINGS: Lungs are clear. Heart size and pulmonary vascularity are normal. No adenopathy. No pneumothorax. No bone lesions. IMPRESSION: No edema or consolidation. Electronically Signed   By: Lowella Grip III M.D.   On: 03/02/2018 14:36   Ct Angio Chest Pe W/cm &/or Wo Cm  Result Date: 03/02/2018 CLINICAL DATA:  Chest pain for 3 weeks EXAM: CT ANGIOGRAPHY CHEST WITH CONTRAST TECHNIQUE: Multidetector CT imaging of the chest was performed using the standard protocol during bolus administration of intravenous contrast. Multiplanar CT  image reconstructions and MIPs were obtained to evaluate the vascular anatomy. CONTRAST:  57mL ISOVUE-370 IOPAMIDOL (ISOVUE-370) INJECTION 76% COMPARISON:  None. FINDINGS: Cardiovascular: --Pulmonary arteries: Contrast injection is sufficient to demonstrate satisfactory opacification of the pulmonary arteries to the segmental level. There is no pulmonary embolus. The main pulmonary artery is within normal limits for size. --Aorta: Limited opacification of the aorta due to bolus timing optimization for the pulmonary arteries. Conventional 3 vessel aortic branching pattern. The aortic course and caliber are normal. There is no aortic atherosclerosis. --Heart: Normal size. No pericardial effusion. Mediastinum/Nodes: No mediastinal, hilar or axillary lymphadenopathy. The visualized thyroid and thoracic esophageal course are unremarkable. Lungs/Pleura: No pulmonary nodules or masses. No pleural effusion or pneumothorax. No focal  airspace consolidation. No focal pleural abnormality. Upper Abdomen: Contrast bolus timing is not optimized for evaluation of the abdominal organs. There is diffuse hepatic hypoattenuation consistent with steatosis. In the right hepatic lobe, there is an enhancing lesion measuring 2.0 cm. Musculoskeletal: No chest wall abnormality. No acute or significant osseous findings. Review of the MIP images confirms the above findings. IMPRESSION: 1. No pulmonary embolus or other acute thoracic abnormality. 2. Hepatic steatosis. 3. Focus of hyperenhancement in the right hepatic lobe, possibly hemangioma or transient hepatic attenuation difference (THAD). Nonemergent hepatic ultrasound may be helpful for further characterization. Electronically Signed   By: Ulyses Jarred M.D.   On: 03/02/2018 18:27    Procedures Procedures (including critical care time)  Medications Ordered in ED Medications  iopamidol (ISOVUE-370) 76 % injection (has no administration in time range)  iopamidol (ISOVUE-370) 76 %  injection 100 mL (74 mLs Intravenous Contrast Given 03/02/18 1810)     Initial Impression / Assessment and Plan / ED Course  I have reviewed the triage vital signs and the nursing notes.  Pertinent labs & imaging results that were available during my care of the patient were reviewed by me and considered in my medical decision making (see chart for details).     37 year old female presents to ED for 9-hour history of waxing and waning left-sided chest pain.  Reports associated shortness of breath.  She has had similar symptoms in the past which usually improve.  States that she saw a cardiologist in 2017 due to similar chest pain.  On exam patient is overall well-appearing.  She is not tachycardic, tachypneic or hypoxic.  Lungs are clear to auscultation bilaterally.  No lower extremity edema, erythema or calf tenderness that would concern me for DVT.  No recent immobilization.  Her chest pain is pleuritic in nature.  Lab work including EKG shows normal sinus rhythm, initial troponin negative.  CBC, BMP unremarkable.  D-dimer slightly elevated.  CT of the chest shows no acute findings can concerning for PE.  There were incidental findings that patient is now aware of.  Delta troponin returned as negative.  Cardiologist visit reviewed from 2017 with normal echocardiogram and low suspicion for cardiac source of chest pain.  I had a discussion with the patient regarding her unremarkable lab work and symptoms.  Patient is concerned that the increased stress in her life after recently finding out about her father's infidelity to his mother, problems that her daughter is having with her boyfriend as well as the death of her brothers 3 months ago is what is causing her chest pain.  Patient states that she needs to establish care with a primary care provider.  We spoke about coping mechanisms and ways to deal with her stress.  Suspect this is the cause of her symptoms.  I doubt cardiac or pulmonary cause of her  symptoms as she is low risk by heart score and negative for PE with CT.  Will advise her to return to ED for any severe worsening symptoms.  Patient is hemodynamically stable, in NAD, and able to ambulate in the ED. Evaluation does not show pathology that would require ongoing emergent intervention or inpatient treatment. I explained the diagnosis to the patient. Pain has been managed and has no complaints prior to discharge. Patient is comfortable with above plan and is stable for discharge at this time. All questions were answered prior to disposition. Strict return precautions for returning to the ED were discussed. Encouraged follow up with PCP.  Portions of this note were generated with [employer]. Dictation errors may occur despite best attempts at proofreading.  Final Clinical Impressions(s) / ED Diagnoses   Final diagnoses:  Chest wall pain    ED Discharge Orders    None       Delia Heady, PA-C 03/02/18 Otis Peak, MD 03/08/18 [phone]

## 2018-10-12 ENCOUNTER — Other Ambulatory Visit: Payer: Self-pay | Admitting: General Practice

## 2018-10-12 DIAGNOSIS — Z20822 Contact with and (suspected) exposure to covid-19: Secondary | ICD-10-CM

## 2018-10-14 LAB — NOVEL CORONAVIRUS, NAA: SARS-CoV-2, NAA: NOT DETECTED

## 2018-11-07 ENCOUNTER — Other Ambulatory Visit: Payer: Self-pay

## 2018-11-07 DIAGNOSIS — Z20822 Contact with and (suspected) exposure to covid-19: Secondary | ICD-10-CM

## 2018-11-08 LAB — NOVEL CORONAVIRUS, NAA: SARS-CoV-2, NAA: NOT DETECTED

## 2018-12-22 ENCOUNTER — Other Ambulatory Visit: Payer: Self-pay

## 2018-12-22 DIAGNOSIS — Z20822 Contact with and (suspected) exposure to covid-19: Secondary | ICD-10-CM

## 2018-12-23 LAB — NOVEL CORONAVIRUS, NAA: SARS-CoV-2, NAA: NOT DETECTED

## 2019-01-30 ENCOUNTER — Other Ambulatory Visit: Payer: Self-pay

## 2019-01-30 ENCOUNTER — Encounter (HOSPITAL_COMMUNITY): Payer: Self-pay

## 2019-01-30 ENCOUNTER — Ambulatory Visit (HOSPITAL_COMMUNITY)
Admission: EM | Admit: 2019-01-30 | Discharge: 2019-01-30 | Disposition: A | Discharge status: Home / Self Care | Payer: Self-pay | Attending: Family Medicine | Admitting: Family Medicine

## 2019-01-30 DIAGNOSIS — R0602 Shortness of breath: Secondary | ICD-10-CM

## 2019-01-30 DIAGNOSIS — Z20822 Contact with and (suspected) exposure to covid-19: Secondary | ICD-10-CM

## 2019-01-30 NOTE — Discharge Instructions (Addendum)
Drink plenty of water Tylenol for pain or fever Be careful about spread of possible virus

## 2019-01-30 NOTE — ED Triage Notes (Signed)
Pt presents with shortness of breath.  Pt had covid swab done earlier today at [facility].

## 2019-01-30 NOTE — ED Provider Notes (Signed)
[facility]    CSN: [id number] Arrival date & time: 01/30/19  1709      History   Chief Complaint Chief Complaint  Patient presents with   Shortness of Breath    HPI Brooke Lam is a 38 y.o. female.   HPI  Patient states she feels a "tickle" in her lungs.  Mild shortness of breath.  She thinks she had a fever.  She is feeling tired.  No known exposure to coronavirus.  No measured fever with a thermometer.  No shaking chills.  No change in appetite, smell, or taste.  No nausea or vomiting.  No cough runny nose or sore throat Patient went earlier today for coronavirus testing.  Is uncertain why she came here this afternoon.  When I inquire, she tells me it is because she wants to know why her lungs tickle if her coronavirus turns out negative  Past Medical History:  Diagnosis Date   Ectopic pregnancy    Preterm labor     Patient Active Problem List   Diagnosis Date Noted   Dyspareunia 11/12/2014   S/P emergency cesarean hysterectomy 08/01/2012   Dysuria 09/17/2011    Past Surgical History:  Procedure Laterality Date   ABDOMINAL HYSTERECTOMY     CESAREAN SECTION     C/S x 4   cesarian section      OB History    Gravida  7   Para  5   Term  3   Preterm  2   AB  2   Living  4     SAB  1   TAB  0   Ectopic  1   Multiple  0   Live Births  4            Home Medications    Prior to Admission medications   Medication Sig Start Date End Date Taking? Authorizing Provider  Multiple Vitamin (MULTIVITAMIN WITH MINERALS) TABS tablet Take 1 tablet by mouth daily.    [provider]    Family History Family History  Problem Relation Age of Onset   Diabetes Father    Diabetes Paternal Grandmother    Anesthesia problems Neg Hx    Hearing loss Neg Hx    Other Neg Hx     Social History Social History   Tobacco Use   Smoking status: Former Smoker    Types: Cigarettes   Smokeless tobacco: Never  Used   Tobacco comment: quit 2005  Substance Use Topics   Alcohol use: No    Alcohol/week: 0.0 standard drinks   Drug use: No     Allergies   Patient has no known allergies.   Review of Systems Review of Systems  Constitutional: Positive for fatigue. Negative for chills and fever.  HENT: Negative for ear pain and sore throat.   Eyes: Negative for pain and visual disturbance.  Respiratory: Positive for shortness of breath. Negative for cough.   Cardiovascular: Negative for chest pain and palpitations.  Gastrointestinal: Negative for abdominal pain and vomiting.  Genitourinary: Negative for dysuria and hematuria.  Musculoskeletal: Negative for arthralgias and back pain.  Skin: Negative for color change and rash.  Neurological: Negative for seizures and syncope.  All other systems reviewed and are negative.    Physical Exam Triage Vital Signs ED Triage Vitals  Enc Vitals Group     BP 01/30/19 1934 127/77     Pulse Rate 01/30/19 1934 72     Resp 01/30/19 1934  17     Temp 01/30/19 1934 98 F (36.7 C)     Temp Source 01/30/19 1934 Oral     SpO2 01/30/19 1934 99 %     Weight --      Height --      Head Circumference --      [place] Flow --      Pain Score 01/30/19 1936 0     Pain Loc --      Pain Edu? --      Excl. in [place]? --    No data found.  Updated Vital Signs BP 127/77 (BP Location: Left Arm)    Pulse 72    Temp 98 F (36.7 C) (Oral)    Resp 17    LMP 10/08/2011    SpO2 99%   Visual Acuity    Physical Exam Constitutional:      General: She is not in acute distress.    Appearance: She is well-developed and normal weight. She is not ill-appearing.  HENT:     Head: Normocephalic and atraumatic.     Right Ear: Tympanic membrane and ear canal normal.     Nose: Nose normal.     Mouth/Throat:     Mouth: Mucous membranes are moist.  Eyes:     Conjunctiva/sclera: Conjunctivae normal.     Pupils: Pupils are equal, round, and reactive to light.  Neck:      Musculoskeletal: Normal range of motion and neck supple.  Cardiovascular:     Rate and Rhythm: Normal rate and regular rhythm.     Heart sounds: Normal heart sounds.  Pulmonary:     Effort: Pulmonary effort is normal. No respiratory distress.     Breath sounds: Normal breath sounds. No wheezing or rales.  Abdominal:     General: There is no distension.     Palpations: Abdomen is soft.  Musculoskeletal: Normal range of motion.  Lymphadenopathy:     Cervical: No cervical adenopathy.  Skin:    General: Skin is warm and dry.  Neurological:     Mental Status: She is alert.  Psychiatric:        Mood and Affect: Mood normal.        Behavior: Behavior normal.   Exam is unremarkable.  Lungs are clear   UC Treatments / Results  Labs (all labs ordered are listed, but only abnormal results are displayed) Labs Reviewed - No data to display  EKG   Radiology No results found.  Procedures Procedures (including critical care time)  Medications Ordered in UC Medications - No data to display  Initial Impression / Assessment and Plan / UC Course  I have reviewed the triage vital signs and the nursing notes.  Pertinent labs & imaging results that were available during my care of the patient were reviewed by me and considered in my medical decision making (see chart for details).     I explained to the patient that I cannot tell her where her lungs feel different today when her lung examination is entirely normal and her vital signs are also good.  I explained that she needs to quarantine until her test results are available. Final Clinical Impressions(s) / UC Diagnoses   Final diagnoses:  SOB (shortness of breath)     Discharge Instructions     Drink plenty of water Tylenol for pain or fever Be careful about spread of possible virus    ED Prescriptions    None  PDMP not reviewed this encounter.   Raylene Everts, MD 01/30/19 2029

## 2019-02-01 LAB — NOVEL CORONAVIRUS, NAA: SARS-CoV-2, NAA: NOT DETECTED

## 2019-02-14 ENCOUNTER — Ambulatory Visit (HOSPITAL_COMMUNITY)
Admission: EM | Admit: 2019-02-14 | Discharge: 2019-02-14 | Disposition: A | Discharge status: Home / Self Care | Payer: HRSA Program | Attending: Family Medicine | Admitting: Family Medicine

## 2019-02-14 ENCOUNTER — Encounter (HOSPITAL_COMMUNITY): Payer: Self-pay

## 2019-02-14 ENCOUNTER — Other Ambulatory Visit: Payer: Self-pay

## 2019-02-14 DIAGNOSIS — U071 COVID-19: Secondary | ICD-10-CM | POA: Diagnosis not present

## 2019-02-14 DIAGNOSIS — J3489 Other specified disorders of nose and nasal sinuses: Secondary | ICD-10-CM | POA: Diagnosis not present

## 2019-02-14 DIAGNOSIS — Z20828 Contact with and (suspected) exposure to other viral communicable diseases: Secondary | ICD-10-CM | POA: Diagnosis present

## 2019-02-14 DIAGNOSIS — Z20822 Contact with and (suspected) exposure to covid-19: Secondary | ICD-10-CM

## 2019-02-14 NOTE — ED Provider Notes (Signed)
[place]    CSN: [id number] Arrival date & time: 02/14/19  [id number]      History   Chief Complaint Chief Complaint  Patient presents with  . Appointment  . (8:30) Covid Exposure    HPI Brooke Lam is a 38 y.o. female no significant past medical history presenting today for Covid testing.  Patient states that her daughter tested positive for Covid approximately 5 days ago.  Today she is started to have some slight throat irritation and some rhinorrhea.  She denies any fevers.  Otherwise very minimal symptoms.  Denies cough, shortness of breath or chest pain.  Denies any known other exposures.  Denies fevers chills or body aches.  HPI  Past Medical History:  Diagnosis Date  . Ectopic pregnancy   . Preterm labor     Patient Active Problem List   Diagnosis Date Noted  . Dyspareunia 11/12/2014  . S/P emergency cesarean hysterectomy 08/01/2012  . Dysuria 09/17/2011    Past Surgical History:  Procedure Laterality Date  . ABDOMINAL HYSTERECTOMY    . CESAREAN SECTION     C/S x 4  . cesarian section      OB History    Gravida  7   Para  5   Term  3   Preterm  2   AB  2   Living  4     SAB  1   TAB  0   Ectopic  1   Multiple  0   Live Births  4            Home Medications    Prior to Admission medications   Medication Sig Start Date End Date Taking? Authorizing Provider  Multiple Vitamin (MULTIVITAMIN WITH MINERALS) TABS tablet Take 1 tablet by mouth daily.    [provider]    Family History Family History  Problem Relation Age of Onset  . Diabetes Father   . Diabetes Paternal Grandmother   . Anesthesia problems Neg Hx   . Hearing loss Neg Hx   . Other Neg Hx     Social History Social History   Tobacco Use  . Smoking status: Former Smoker    Types: Cigarettes  . Smokeless tobacco: Never Used  . Tobacco comment: quit 2005  Substance Use Topics  . Alcohol use: No    Alcohol/week: 0.0 standard  drinks  . Drug use: No     Allergies   Patient has no known allergies.   Review of Systems Review of Systems  Constitutional: Negative for activity change, appetite change, chills, fatigue and fever.  HENT: Positive for rhinorrhea. Negative for congestion, ear pain, sinus pressure, sore throat and trouble swallowing.   Eyes: Negative for discharge and redness.  Respiratory: Negative for cough, chest tightness and shortness of breath.   Cardiovascular: Negative for chest pain.  Gastrointestinal: Negative for abdominal pain, diarrhea, nausea and vomiting.  Musculoskeletal: Negative for myalgias.  Skin: Negative for rash.  Neurological: Negative for dizziness, light-headedness and headaches.     Physical Exam Triage Vital Signs ED Triage Vitals [02/14/19 0858]  Enc Vitals Group     BP 124/80     Pulse Rate (!) 57     Resp 18     Temp 98.8 F (37.1 C)     Temp Source Oral     SpO2 100 %     Weight      Height      Head Circumference  Peak Flow      Pain Score 0     Pain Loc      Pain Edu?      Excl. in [place]?    No data found.  Updated Vital Signs BP 124/80 (BP Location: Left Arm)   Pulse (!) 57   Temp 98.8 F (37.1 C) (Oral)   Resp 18   LMP 10/08/2011   SpO2 100%   Visual Acuity Right Eye Distance:   Left Eye Distance:   Bilateral Distance:    Right Eye Near:   Left Eye Near:    Bilateral Near:     Physical Exam Vitals signs and nursing note reviewed.  Constitutional:      General: She is not in acute distress.    Appearance: She is well-developed.  HENT:     Head: Normocephalic and atraumatic.     Mouth/Throat:     Comments: Oral mucosa pink and moist, no tonsillar enlargement or exudate. Posterior pharynx patent and nonerythematous, no uvula deviation or swelling. Normal phonation. Eyes:     Conjunctiva/sclera: Conjunctivae normal.  Neck:     Musculoskeletal: Neck supple.  Cardiovascular:     Rate and Rhythm: Normal rate and regular rhythm.      Heart sounds: No murmur.  Pulmonary:     Effort: Pulmonary effort is normal. No respiratory distress.     Breath sounds: Normal breath sounds.     Comments: Breathing comfortably at rest, CTABL, no wheezing, rales or other adventitious sounds auscultated Abdominal:     Palpations: Abdomen is soft.     Tenderness: There is no abdominal tenderness.  Skin:    General: Skin is warm and dry.  Neurological:     Mental Status: She is alert.      UC Treatments / Results  Labs (all labs ordered are listed, but only abnormal results are displayed) Labs Reviewed  NOVEL CORONAVIRUS, NAA (HOSP ORDER, SEND-OUT TO REF LAB; TAT 18-24 HRS)    EKG   Radiology No results found.  Procedures Procedures (including critical care time)  Medications Ordered in UC Medications - No data to display  Initial Impression / Assessment and Plan / UC Course  I have reviewed the triage vital signs and the nursing notes.  Pertinent labs & imaging results that were available during my care of the patient were reviewed by me and considered in my medical decision making (see chart for details).     Covid swab pending, mild rhinorrhea, relatively asymptomatic.  Continue symptomatic and supportive care.  Discussed general precautions and recommendations.  Quarantine until results return.Discussed strict return precautions. Patient verbalized understanding and is agreeable with plan.  Final Clinical Impressions(s) / UC Diagnoses   Final diagnoses:  Exposure to COVID-19 virus     Discharge Instructions        Person Under Monitoring Name: Brooke Lam  Location: [place] [address]   Infection Prevention Recommendations for Individuals Confirmed to have, or Being Evaluated for, 2019 Novel Coronavirus (COVID-19) Infection Who Receive Care at Home  Individuals who are confirmed to have, or are being evaluated for, COVID-19 should follow the prevention steps  below until a healthcare provider or local or state health department says they can return to normal activities.  Stay home except to get medical care You should restrict activities outside your home, except for getting medical care. Do not go to work, school, or public areas, and do not use public transportation or taxis.  Call ahead before visiting your doctor Before your medical appointment, call the healthcare provider and tell them that you have, or are being evaluated for, COVID-19 infection. This will help the healthcare provider's office take steps to keep other people from getting infected. Ask your healthcare provider to call the local or state health department.  Monitor your symptoms Seek prompt medical attention if your illness is worsening (e.g., difficulty breathing). Before going to your medical appointment, call the healthcare provider and tell them that you have, or are being evaluated for, COVID-19 infection. Ask your healthcare provider to call the local or state health department.  Wear a facemask You should wear a facemask that covers your nose and mouth when you are in the same room with other people and when you visit a healthcare provider. People who live with or visit you should also wear a facemask while they are in the same room with you.  Separate yourself from other people in your home As much as possible, you should stay in a different room from other people in your home. Also, you should use a separate bathroom, if available.  Avoid sharing household items You should not share dishes, drinking glasses, cups, eating utensils, towels, bedding, or other items with other people in your home. After using these items, you should wash them thoroughly with soap and water.  Cover your coughs and sneezes Cover your mouth and nose with a tissue when you cough or sneeze, or you can cough or sneeze into your sleeve. Throw used tissues in a lined trash can, and  immediately wash your hands with soap and water for at least 20 seconds or use an alcohol-based hand rub.  Wash your [facility] [employer] your hands often and thoroughly with soap and water for at least 20 seconds. You can use an alcohol-based hand sanitizer if soap and water are not available and if your hands are not visibly dirty. Avoid touching your eyes, nose, and mouth with unwashed hands.   Prevention Steps for Caregivers and Household Members of Individuals Confirmed to have, or Being Evaluated for, COVID-19 Infection Being Cared for in the Home  If you live with, or provide care at home for, a person confirmed to have, or being evaluated for, COVID-19 infection please follow these guidelines to prevent infection:  Follow healthcare provider's instructions Make sure that you understand and can help the patient follow any healthcare provider instructions for all care.  Provide for the patient's basic needs You should help the patient with basic needs in the home and provide support for getting groceries, prescriptions, and other personal needs.  Monitor the patient's symptoms If they are getting sicker, call his or her medical provider and tell them that the patient has, or is being evaluated for, COVID-19 infection. This will help the healthcare provider's office take steps to keep other people from getting infected. Ask the healthcare provider to call the local or state health department.  Limit the number of people who have contact with the patient  If possible, have only one caregiver for the patient.  Other household members should stay in another home or place of residence. If this is not possible, they should stay  in another room, or be separated from the patient as much as possible. Use a separate bathroom, if available.  Restrict visitors who do not have an essential need to be in the home.  Keep older adults, very young children, and other sick people away from the  patient  Keep older adults, very young children, and those who have compromised immune systems or chronic health conditions away from the patient. This includes people with chronic heart, lung, or kidney conditions, diabetes, and cancer.  Ensure good ventilation Make sure that shared spaces in the home have good air flow, such as from an air conditioner or an opened window, weather permitting.  Wash your hands often  Wash your hands often and thoroughly with soap and water for at least 20 seconds. You can use an alcohol based hand sanitizer if soap and water are not available and if your hands are not visibly dirty.  Avoid touching your eyes, nose, and mouth with unwashed hands.  Use disposable paper towels to dry your hands. If not available, use dedicated cloth towels and replace them when they become wet.  Wear a facemask and gloves  Wear a disposable facemask at all times in the room and gloves when you touch or have contact with the patient's blood, body fluids, and/or secretions or excretions, such as sweat, saliva, sputum, nasal mucus, vomit, urine, or feces.  Ensure the mask fits over your nose and mouth tightly, and do not touch it during use.  Throw out disposable facemasks and gloves after using them. Do not reuse.  Wash your hands immediately after removing your facemask and gloves.  If your personal clothing becomes contaminated, carefully remove clothing and launder. Wash your hands after handling contaminated clothing.  Place all used disposable facemasks, gloves, and other waste in a lined container before disposing them with other household waste.  Remove gloves and wash your hands immediately after handling these items.  Do not share dishes, glasses, or other household items with the patient  Avoid sharing household items. You should not share dishes, drinking glasses, cups, eating utensils, towels, bedding, or other items with a patient who is confirmed to have, or  being evaluated for, COVID-19 infection.  After the person uses these items, you should wash them thoroughly with soap and water.  Wash laundry thoroughly  Immediately remove and wash clothes or bedding that have blood, body fluids, and/or secretions or excretions, such as sweat, saliva, sputum, nasal mucus, vomit, urine, or feces, on them.  Wear gloves when handling laundry from the patient.  Read and follow directions on labels of laundry or clothing items and detergent. In general, wash and dry with the warmest temperatures recommended on the label.  Clean all areas the individual has used often  Clean all touchable surfaces, such as counters, tabletops, doorknobs, bathroom fixtures, toilets, phones, keyboards, tablets, and bedside tables, every day. Also, clean any surfaces that may have blood, body fluids, and/or secretions or excretions on them.  Wear gloves when cleaning surfaces the patient has come in contact with.  Use a diluted bleach solution (e.g., dilute bleach with 1 part bleach and 10 parts water) or a household disinfectant with a label that says EPA-registered for coronaviruses. To make a bleach solution at home, add 1 tablespoon of bleach to 1 quart (4 cups) of water. For a larger supply, add  cup of bleach to 1 gallon (16 cups) of water.  Read labels of cleaning products and follow recommendations provided on product labels. Labels contain instructions for safe and effective use of the cleaning product including precautions you should take when applying the product, such as wearing gloves or eye protection and making sure you have good ventilation during use of the product.  Remove gloves and wash hands immediately after cleaning.  Monitor yourself for signs and symptoms of illness Caregivers and household members are considered close contacts, should monitor their health, and will be asked to limit movement outside of the home to the extent possible. Follow the  monitoring steps for close contacts listed on the symptom monitoring form.   ? If you have additional questions, contact your local health department or call the epidemiologist on call at [phone] (available 24/7). ? This guidance is subject to change. For the most up-to-date guidance from [facility], please refer to their website: [email or web address]    ED Prescriptions    None     PDMP not reviewed this encounter.   Janith Lima, [place] 02/14/19 [phone]

## 2019-02-14 NOTE — Discharge Instructions (Signed)
Person Under Monitoring Name: Brooke Lam  Location: [place] [address]   Infection Prevention Recommendations for Individuals Confirmed to have, or Being Evaluated for, 2019 Novel Coronavirus (COVID-19) Infection Who Receive Care at Home  Individuals who are confirmed to have, or are being evaluated for, COVID-19 should follow the prevention steps below until a healthcare provider or local or state health department says they can return to normal activities.  Stay home except to get medical care You should restrict activities outside your home, except for getting medical care. Do not go to work, school, or public areas, and do not use public transportation or taxis.  Call ahead before visiting your doctor Before your medical appointment, call the healthcare provider and tell them that you have, or are being evaluated for, COVID-19 infection. This will help the healthcare providers office take steps to keep other people from getting infected. Ask your healthcare provider to call the local or state health department.  Monitor your symptoms Seek prompt medical attention if your illness is worsening (e.g., difficulty breathing). Before going to your medical appointment, call the healthcare provider and tell them that you have, or are being evaluated for, COVID-19 infection. Ask your healthcare provider to call the local or state health department.  Wear a facemask You should wear a facemask that covers your nose and mouth when you are in the same room with other people and when you visit a healthcare provider. People who live with or visit you should also wear a facemask while they are in the same room with you.  Separate yourself from other people in your home As much as possible, you should stay in a different room from other people in your home. Also, you should use a separate bathroom, if available.  Avoid sharing household items You should  not share dishes, drinking glasses, cups, eating utensils, towels, bedding, or other items with other people in your home. After using these items, you should wash them thoroughly with soap and water.  Cover your coughs and sneezes Cover your mouth and nose with a tissue when you cough or sneeze, or you can cough or sneeze into your sleeve. Throw used tissues in a lined trash can, and immediately wash your hands with soap and water for at least 20 seconds or use an alcohol-based hand rub.  Wash your [employer] your hands often and thoroughly with soap and water for at least 20 seconds. You can use an alcohol-based hand sanitizer if soap and water are not available and if your hands are not visibly dirty. Avoid touching your eyes, nose, and mouth with unwashed hands.   Prevention Steps for Caregivers and Household Members of Individuals Confirmed to have, or Being Evaluated for, COVID-19 Infection Being Cared for in the Home  If you live with, or provide care at home for, a person confirmed to have, or being evaluated for, COVID-19 infection please follow these guidelines to prevent infection:  Follow healthcare providers instructions Make sure that you understand and can help the patient follow any healthcare provider instructions for all care.  Provide for the patients basic needs You should help the patient with basic needs in the home and provide support for getting groceries, prescriptions, and other personal needs.  Monitor the patients symptoms If they are getting sicker, call his or her medical provider and tell them that the patient has, or is being evaluated for, COVID-19 infection. This will help the healthcare providers office  take steps to keep other people from getting infected. Ask the healthcare provider to call the local or state health department.  Limit the number of people who have contact with the patient If possible, have only one caregiver for the  patient. Other household members should stay in another home or place of residence. If this is not possible, they should stay in another room, or be separated from the patient as much as possible. Use a separate bathroom, if available. Restrict visitors who do not have an essential need to be in the home.  Keep older adults, very young children, and other sick people away from the patient Keep older adults, very young children, and those who have compromised immune systems or chronic health conditions away from the patient. This includes people with chronic heart, lung, or kidney conditions, diabetes, and cancer.  Ensure good ventilation Make sure that shared spaces in the home have good air flow, such as from an air conditioner or an opened window, weather permitting.  Wash your hands often Wash your hands often and thoroughly with soap and water for at least 20 seconds. You can use an alcohol based hand sanitizer if soap and water are not available and if your hands are not visibly dirty. Avoid touching your eyes, nose, and mouth with unwashed hands. Use disposable paper towels to dry your hands. If not available, use dedicated cloth towels and replace them when they become wet.  Wear a facemask and gloves Wear a disposable facemask at all times in the room and gloves when you touch or have contact with the patients blood, body fluids, and/or secretions or excretions, such as sweat, saliva, sputum, nasal mucus, vomit, urine, or feces.  Ensure the mask fits over your nose and mouth tightly, and do not touch it during use. Throw out disposable facemasks and gloves after using them. Do not reuse. Wash your hands immediately after removing your facemask and gloves. If your personal clothing becomes contaminated, carefully remove clothing and launder. Wash your hands after handling contaminated clothing. Place all used disposable facemasks, gloves, and other waste in a lined container before  disposing them with other household waste. Remove gloves and wash your hands immediately after handling these items.  Do not share dishes, glasses, or other household items with the patient Avoid sharing household items. You should not share dishes, drinking glasses, cups, eating utensils, towels, bedding, or other items with a patient who is confirmed to have, or being evaluated for, COVID-19 infection. After the person uses these items, you should wash them thoroughly with soap and water.  Wash laundry thoroughly Immediately remove and wash clothes or bedding that have blood, body fluids, and/or secretions or excretions, such as sweat, saliva, sputum, nasal mucus, vomit, urine, or feces, on them. Wear gloves when handling laundry from the patient. Read and follow directions on labels of laundry or clothing items and detergent. In general, wash and dry with the warmest temperatures recommended on the label.  Clean all areas the individual has used often Clean all touchable surfaces, such as counters, tabletops, doorknobs, bathroom fixtures, toilets, phones, keyboards, tablets, and bedside tables, every day. Also, clean any surfaces that may have blood, body fluids, and/or secretions or excretions on them. Wear gloves when cleaning surfaces the patient has come in contact with. Use a diluted bleach solution (e.g., dilute bleach with 1 part bleach and 10 parts water) or a household disinfectant with a label that says EPA-registered for coronaviruses. To make a bleach  solution at home, add 1 tablespoon of bleach to 1 quart (4 cups) of water. For a larger supply, add  cup of bleach to 1 gallon (16 cups) of water. Read labels of cleaning products and follow recommendations provided on product labels. Labels contain instructions for safe and effective use of the cleaning product including precautions you should take when applying the product, such as wearing gloves or eye protection and making sure you  have good ventilation during use of the product. Remove gloves and wash hands immediately after cleaning.  Monitor yourself for signs and symptoms of illness Caregivers and household members are considered close contacts, should monitor their health, and will be asked to limit movement outside of the home to the extent possible. Follow the monitoring steps for close contacts listed on the symptom monitoring form.   ? If you have additional questions, contact your local health department or call the epidemiologist on call at [phone] (available 24/7). ? This guidance is subject to change. For the most up-to-date guidance from [facility], please refer to their website: [email or web address]

## 2019-02-14 NOTE — ED Triage Notes (Signed)
Pt presents for covid testing after daughter tested positive 5 days ago.

## 2019-02-17 ENCOUNTER — Ambulatory Visit: Payer: Self-pay

## 2019-02-17 LAB — NOVEL CORONAVIRUS, NAA (HOSP ORDER, SEND-OUT TO REF LAB; TAT 18-24 HRS): SARS-CoV-2, NAA: DETECTED — AB

## 2019-02-18 ENCOUNTER — Telehealth (HOSPITAL_COMMUNITY): Payer: Self-pay | Admitting: Emergency Medicine

## 2019-02-18 NOTE — Telephone Encounter (Signed)
Positive covid detected on sample. Contacted patient to be sure she was aware. Pt had reviewed her results already in [place]. Her only question was when she came off quarantine. Discussed with her when her symptoms started, she can return to normal on dec 6th as long as no fever and symptoms improving. Pt verbalized understanding, all questions answered.

## 2019-04-26 ENCOUNTER — Other Ambulatory Visit (HOSPITAL_COMMUNITY): Payer: Self-pay | Admitting: *Deleted

## 2019-04-26 DIAGNOSIS — N6310 Unspecified lump in the right breast, unspecified quadrant: Secondary | ICD-10-CM

## 2019-05-09 ENCOUNTER — Ambulatory Visit: Payer: Self-pay | Admitting: Medical

## 2019-05-09 ENCOUNTER — Other Ambulatory Visit: Payer: Self-pay

## 2019-05-09 ENCOUNTER — Encounter: Payer: Self-pay | Admitting: Medical

## 2019-05-09 ENCOUNTER — Ambulatory Visit: Payer: Self-pay

## 2019-05-09 VITALS — BP 125/86 | Temp 97.1°F | Wt 184.0 lb

## 2019-05-09 DIAGNOSIS — N6310 Unspecified lump in the right breast, unspecified quadrant: Secondary | ICD-10-CM

## 2019-05-09 NOTE — Progress Notes (Signed)
Ms. Arrionna Meddaugh is a 39 y.o. female who presents to [facility] clinic today with complaint of right breast lump.    Pap Smear: Pap not smear completed today. Last Pap smear was 2016 at [facility] was normal. Since then patient had hysterectomy. Per patient has no history of an abnormal Pap smear. Last Pap smear result is available in Epic.   Physical exam: Breasts Breast asymmetry noted with right>left. Small nodule palpated at 3 o'clock on the right breast. Diffuse, mild fibrocystic changes noted bilaterally. No nipple retraction bilateral breasts. No nipple discharge bilateral breasts. No lymphadenopathy.    Pelvic/Bimanual Pap is not indicated today    Smoking History: Patient has is a former smoker.     Patient Navigation: Patient education provided. Access to services provided for patient through [facility] program. Interpreter provided.   Colorectal Cancer Screening: Per patient has never had colonoscopy completed No complaints today.    Breast and Cervical Cancer Risk Assessment: Patient does not have family history of breast cancer, known genetic mutations, or radiation treatment to the chest before age 62. Patient does not have history of cervical dysplasia, immunocompromised, or DES exposure in-utero.  Risk Assessment    Risk Scores      05/09/2019   Last edited by: Demetrius Revel, LPN   5-year risk: 0.2 %   Lifetime risk: 4.7 %          A: BCCCP exam without pap smear Complaint of right breast lump  P: Referred patient to the [facility] for a diagnostic mammogram. Appointment scheduled 05/10/19.  Luvenia Redden, PA-C 05/09/2019 4:17 PM

## 2019-05-09 NOTE — Patient Instructions (Signed)
Mamografa Mammogram [employer] es un radiografa de las mamas que se realiza para determinar si hay cambios que no son normales. Este estudio permite explorar y Pension scheme manager cualquier cambio que pudiera sugerir la presencia de cncer de mama. Las [employer] se Research scientist (medical) en las mujeres. Un hombre puede hacerse una mamografa si tiene un bulto o hinchazn en la mama. Tambin puede ayudar a identificar otros cambios y variaciones en las mamas. Informe al mdico:  Acerca de cualquier alergia que tenga.  Si tiene implantes mamarios.  Si ha tenido enfermedades, biopsias o cirugas previas de Scientist, research (medical).  Si est amamantando.  Si es menor de 25aos.  Si tiene antecedentes familiares de cncer de mama.  Si est embarazada o podra estarlo. Cules son los riesgos? En general, se trata de un procedimiento seguro. Sin embargo, pueden ocurrir complicaciones, por ejemplo:  Exposicin a la radiacin. En [employer], los [place] de radiacin son muy bajos.  La interpretacin [employer].  La necesidad de Optometrist ms [employer].  La imposibilidad de la mamografa de Hydrographic surveyor algunos tipos de cncer. Qu ocurre antes del procedimiento?  Hgase este estudio aproximadamente 1 o 2semanas despus de la [place]. Generalmente, este es el momento en que las mamas estn menos sensibles.  Si consulta a un mdico nuevo o cambia de Chief of Staff, enve las [employer]ck & Co anteriores al [employer].  El da del estudio, lvese las [place].  No use desodorantes, perfumes, lociones o talcos el da del estudio.  Qutese las alhajas del cuello.  Use prendas que pueda ponerse y sacarse fcilmente. Qu ocurre durante el procedimiento?   Se quitar la ropa de la cintura para [place]. Se colocar una bata.  Debe permanecer de pie delante de la mquina de rayos-X.  Se colocar cada mama entre dos placas de [place] unos segundos. Intente estar lo ms relajada posible. Esto no causa ningn dao a las mamas. Si siente alguna molestia, ser pasajera.  Se tomarn radiografas desde diferentes ngulos de cada mama. Este procedimiento puede variar segn el mdico y el hospital. Sander Nephew ocurre despus del procedimiento?  Brooke Lam ser leda por un especialista (radilogo).  Tal vez deba repetir [employer] del estudio. Esto depende de la calidad de las imgenes.  Pregunte la fecha en que los resultados estarn disponibles. Asegrese de [employer].  Puede volver a sus actividades habituales. Resumen  Brooke Lam es un radiografa de baja energa de las mamas que se realiza para determinar si hay cambios anormales. Un hombre puede [employer] examen si tiene un bulto o hinchazn en la mama.  Antes del procedimiento, informe al mdico sobre cualquier problema en las mamas que haya tenido en el pasado.  Hgase este estudio aproximadamente 1 o 2semanas despus de la [place].  Para el examen, se colocar cada mama entre dos placas de [place] unos segundos.  Brooke Lam ser leda por un especialista (radilogo). Pregunte la fecha en que los resultados estarn disponibles. Asegrese de [employer]. Esta informacin no tiene Marine scientist el consejo del mdico. Asegrese de hacerle al mdico cualquier pregunta que tenga. Document Revised: 12/07/2017 Document Reviewed: 12/07/2017 Elsevier Patient Education  [place].

## 2019-05-10 ENCOUNTER — Ambulatory Visit
Admission: RE | Admit: 2019-05-10 | Discharge: 2019-05-10 | Disposition: A | Discharge status: Home / Self Care | Payer: No Typology Code available for payment source | Source: Ambulatory Visit | Attending: Obstetrics and Gynecology | Admitting: Obstetrics and Gynecology

## 2019-05-10 DIAGNOSIS — N6310 Unspecified lump in the right breast, unspecified quadrant: Secondary | ICD-10-CM

## 2019-05-11 ENCOUNTER — Ambulatory Visit: Payer: MEDICAID | Attending: Family Medicine | Admitting: Family Medicine

## 2019-05-11 ENCOUNTER — Encounter: Payer: Self-pay | Admitting: Family Medicine

## 2019-05-11 ENCOUNTER — Other Ambulatory Visit: Payer: Self-pay

## 2019-05-11 DIAGNOSIS — Z789 Other specified health status: Secondary | ICD-10-CM

## 2019-05-11 DIAGNOSIS — A6004 Herpesviral vulvovaginitis: Secondary | ICD-10-CM

## 2019-05-11 DIAGNOSIS — A609 Anogenital herpesviral infection, unspecified: Secondary | ICD-10-CM

## 2019-05-11 DIAGNOSIS — N898 Other specified noninflammatory disorders of vagina: Secondary | ICD-10-CM

## 2019-05-11 DIAGNOSIS — R739 Hyperglycemia, unspecified: Secondary | ICD-10-CM

## 2019-05-11 DIAGNOSIS — Z758 Other problems related to medical facilities and other health care: Secondary | ICD-10-CM

## 2019-05-11 DIAGNOSIS — Z833 Family history of diabetes mellitus: Secondary | ICD-10-CM

## 2019-05-11 DIAGNOSIS — D241 Benign neoplasm of right breast: Secondary | ICD-10-CM | POA: Insufficient documentation

## 2019-05-11 DIAGNOSIS — Z603 Acculturation difficulty: Secondary | ICD-10-CM

## 2019-05-11 MED ORDER — NYSTATIN 100000 UNIT/GM EX CREA: 1.0000 "application " | TOPICAL_CREAM | Freq: Three times a day (TID) | CUTANEOUS | 3 refills | Status: DC

## 2019-05-11 MED ORDER — FLUCONAZOLE 150 MG PO TABS: 150.0000 mg | ORAL_TABLET | Freq: Once | ORAL | 0 refills | Status: AC

## 2019-05-11 MED ORDER — VALACYCLOVIR HCL 500 MG PO TABS: 500.0000 mg | ORAL_TABLET | Freq: Two times a day (BID) | ORAL | 2 refills | Status: AC

## 2019-05-11 NOTE — Progress Notes (Signed)
Medical Assistant used [employer] to contact patient.  Interpreter Name: Lemar Livings #: [id number] Patient verified DOB Patient denies pain at this time. Patient has not eaten today. Patient has not taken medication today.

## 2019-05-11 NOTE — Progress Notes (Signed)
Virtual Visit via Telephone Note  I connected with Brooke Lam on 05/11/19 at  8:50 AM EST by telephone and verified that I am speaking with the correct person using two identifiers.   I discussed the limitations, risks, security and privacy concerns of performing an evaluation and management service by telephone and the availability of in person appointments. I also discussed with the patient that there may be a patient responsible charge related to this service. The patient expressed understanding and agreed to proceed.  Patient Location: Home Provider Location: [facility] Others participating in call: Fredia Beets # [id number]   History of Present Illness:       39 yo Hispanic female, non-English speaking, who is establishing care. She reports that she was diagnosed with HSV and placed on a medication and a cream. The pill was valcyclovir. She does not feel that the pill actually worked. She continues to have issues with itching and irritation in her vaginal area after completing the pills about 4 days ago. She has itching/irritation in the vaginal and rectal area. She denies any personal history of diabetes and no gestational diabetes. Upon questioning, she states that the last time that her blood sugar was checked, she was told that it was 125 but she was not told that this was abnormal.  She does have a strong family history of diabetes however. She denies increased thirst or urinary frequency at this time. She denies abdominal pain and no fever or chills. She does have discomfort with urination which she believes is due to the irritation in her vaginal area. She reports history of prior hysterectomy. She wonders if she still needs to have Pap smears done.          Past Medical History:  Diagnosis Date  . Ectopic pregnancy   . Preterm labor     Past Surgical History:  Procedure Laterality Date  . ABDOMINAL HYSTERECTOMY    . BREAST BIOPSY Right 2013   FA  . CESAREAN SECTION       C/S x 4  . cesarian section      Family History  Problem Relation Age of Onset  . Diabetes Father   . Diabetes Paternal Grandmother   . Anesthesia problems Neg Hx   . Hearing loss Neg Hx   . Other Neg Hx     Social History   Tobacco Use  . Smoking status: Former Smoker    Types: Cigarettes  . Smokeless tobacco: Never Used  . Tobacco comment: quit 2005  Substance Use Topics  . Alcohol use: No    Alcohol/week: 0.0 standard drinks  . Drug use: No     No Known Allergies     Observations/Objective: No vital signs or physical exam conducted as visit was done via telephone  Assessment and Plan: 1. Anogenital HSV infection She reports that she has been diagnosed with HSV which she believes she contracted from her husband. Patient reports that she was placed on valacyclovir as well as a cream to help with genital herpes. She has completed the oral medication and is using the cream but she has had no decrease/improvement in her symptoms. Patient will also be prescribed medication for possible vaginal yeast infection as the cause of her itching. If her itching has not improved in 3 days after taking the Diflucan, she can then restart prescription for Valtrex but should follow-up next week if symptoms continue/do not improve. - valACYclovir (VALTREX) 500 MG tablet; Take 1 tablet (500  mg total) by mouth 2 (two) times daily for 5 days.  Dispense: 10 tablet; Refill: 2  2. Vaginal itching Discussed with the patient through the interpreter that since she has not had response to prior valacyclovir treatment which just ended 3 to 4 days ago, I suspect that her vaginal itching and irritation may be related to yeast as she has had past elevated blood sugar readings. Prescription has been sent to her pharmacy for nystatin cream which she may apply 3 times daily x10 days then as needed and prescription for Diflucan 150 mg p.o. x1. If she has not gotten any improvement in the vaginal itching within 3  days after the Diflucan, she may then restart valacyclovir. She has been asked to follow-up next week if her symptoms have not improved otherwise schedule 2 to 3-week follow-up. - nystatin cream (MYCOSTATIN); Apply 1 application topically 3 (three) times daily. X 10 days then as needed  Dispense: 30 g; Refill: 3 - fluconazole (DIFLUCAN) 150 MG tablet; Take 1 tablet (150 mg total) by mouth once for 1 dose.  Dispense: 1 tablet; Refill: 0  3. Elevated blood sugar level 4. Family history of diabetes mellitus Patient reports that she was told in the past that her blood sugar was 125. On review of chart, she has had blood sugar level of 127 on a basic metabolic panel done [id number]. She also has strong family history of diabetes. I suspect that she may also be prediabetic or diabetic therefore when patient comes in for follow-up visit in 2 to 3 weeks, she will have hemoglobin A1c and glucose checked to see if she may be diabetic.  4. Language barrier Spanish-speaking interpreter through [facility] interpretation systems used to help with language barrier to avoid miscommunication of healthcare information  Follow Up Instructions: Next week if no improvement in symptoms, otherwise 2 to 3 weeks    I discussed the assessment and treatment plan with the patient. The patient was provided an opportunity to ask questions and all were answered. The patient agreed with the plan and demonstrated an understanding of the instructions.   The patient was advised to call back or seek an in-person evaluation if the symptoms worsen or if the condition fails to improve as anticipated.  I provided 24 minutes of non-face-to-face time during this encounter. (Phone encounter was longer due to the need for Spanish interpretation)   Antony Blackbird, MD

## 2019-06-06 ENCOUNTER — Other Ambulatory Visit: Payer: Self-pay

## 2019-06-06 ENCOUNTER — Emergency Department (HOSPITAL_COMMUNITY): Payer: Self-pay

## 2019-06-06 ENCOUNTER — Emergency Department (HOSPITAL_COMMUNITY)
Admission: EM | Admit: 2019-06-06 | Discharge: 2019-06-06 | Disposition: A | Discharge status: Home / Self Care | Payer: Self-pay | Attending: Emergency Medicine | Admitting: Emergency Medicine

## 2019-06-06 ENCOUNTER — Encounter (HOSPITAL_COMMUNITY): Payer: Self-pay | Admitting: Emergency Medicine

## 2019-06-06 DIAGNOSIS — Z87891 Personal history of nicotine dependence: Secondary | ICD-10-CM | POA: Insufficient documentation

## 2019-06-06 DIAGNOSIS — R5383 Other fatigue: Secondary | ICD-10-CM | POA: Insufficient documentation

## 2019-06-06 DIAGNOSIS — R109 Unspecified abdominal pain: Secondary | ICD-10-CM | POA: Insufficient documentation

## 2019-06-06 LAB — URINALYSIS, ROUTINE W REFLEX MICROSCOPIC
Bilirubin Urine: NEGATIVE
Glucose, UA: NEGATIVE mg/dL
Ketones, ur: NEGATIVE mg/dL
Leukocytes,Ua: NEGATIVE
Nitrite: NEGATIVE
Protein, ur: NEGATIVE mg/dL
Specific Gravity, Urine: 1.018 (ref 1.005–1.030)
pH: 8 (ref 5.0–8.0)

## 2019-06-06 LAB — BASIC METABOLIC PANEL
Anion gap: 8 (ref 5–15)
BUN: 14 mg/dL (ref 6–20)
CO2: 28 mmol/L (ref 22–32)
Calcium: 9 mg/dL (ref 8.9–10.3)
Chloride: 102 mmol/L (ref 98–111)
Creatinine, Ser: 0.78 mg/dL (ref 0.44–1.00)
GFR calc Af Amer: >60 mL/min (ref >60)
GFR calc non Af Amer: >60 mL/min (ref >60)
Glucose, Bld: 119 mg/dL — ABNORMAL HIGH (ref 70–99)
Potassium: 3.6 mmol/L (ref 3.5–5.1)
Sodium: 138 mmol/L (ref 135–145)

## 2019-06-06 LAB — CBC
HCT: 38.6 % (ref 36.0–46.0)
Hemoglobin: 12.4 g/dL (ref 12.0–15.0)
MCH: 27.7 pg (ref 26.0–34.0)
MCHC: 32.1 g/dL (ref 30.0–36.0)
MCV: 86.4 fL (ref 80.0–100.0)
Platelets: 260 10*3/uL (ref 150–400)
RBC: 4.47 MIL/uL (ref 3.87–5.11)
RDW: 13.1 % (ref 11.5–15.5)
WBC: 6 10*3/uL (ref 4.0–10.5)
nRBC: 0 % (ref 0.0–0.2)

## 2019-06-06 LAB — HEPATIC FUNCTION PANEL
ALT: 17 U/L (ref 0–44)
AST: 26 U/L (ref 15–41)
Albumin: 4.1 g/dL (ref 3.5–5.0)
Alkaline Phosphatase: 56 U/L (ref 38–126)
Bilirubin, Direct: <0.1 mg/dL (ref 0.0–0.2)
Total Bilirubin: 0.5 mg/dL (ref 0.3–1.2)
Total Protein: 7.3 g/dL (ref 6.5–8.1)

## 2019-06-06 NOTE — ED Provider Notes (Signed)
[facility] EMERGENCY DEPARTMENT Provider Note   CSN: [id number] Arrival date & time: 06/06/19  1525     History Chief Complaint  Patient presents with   Flank Pain    Brooke Lam is a 39 y.o. female.  39 y.o female with no pertinent PMH of Covid 19 infection on 01/2019 presents to the ED with complaints of left flank pain for the past 4 months.  Patient reports she was diagnosed with COVID-19 on November 2020, has since had left flank pain along with intermittent fatigue.  She reports her symptoms were usually waxing and waning however they have seemed to be persistent for the past 6 days.  He also endorses some nausea, reports she has not had any emesis episodes.  Has not taken any medication for improvement in her symptoms.  According to her chart which have extensively reviewed, she was seen by her PCP last month, suspected to be borderline diabetic.  No urinary symptoms, no abdominal pain, no fever.  Of note, patient's last menstrual period was sometime in 2014 as she did have a hysterectomy in 2013.   The history is provided by the patient and medical records.       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   Dyspareunia 11/12/2014   S/P emergency cesarean hysterectomy 08/01/2012   Dysuria 09/17/2011    Past Surgical History:  Procedure Laterality Date   ABDOMINAL HYSTERECTOMY     BREAST BIOPSY Right 2013   FA   CESAREAN SECTION     C/S x 4   cesarian section       OB History    Gravida  7   Para  5   Term  3   Preterm  2   AB  2   Living  4     SAB  1   TAB  0   Ectopic  1   Multiple  0   Live Births  4           Family History  Problem Relation Age of Onset   Diabetes Father    Diabetes Paternal Grandmother    Anesthesia problems Neg Hx    Hearing loss  Neg Hx    Other Neg Hx     Social History   Tobacco Use   Smoking status: Former Smoker    Types: Cigarettes   Smokeless tobacco: Never Used   Tobacco comment: quit 2005  Substance Use Topics   Alcohol use: No    Alcohol/week: 0.0 standard drinks   Drug use: No    Home Medications Prior to Admission medications   Medication Sig Start Date End Date Taking? Authorizing Provider  nystatin cream (MYCOSTATIN) Apply 1 application topically 3 (three) times daily. X 10 days then as needed Patient not taking: Reported on 06/06/2019 05/11/19   Antony Blackbird, MD    Allergies    Patient has no known allergies.  Review of Systems   Review of Systems  Constitutional: Negative for fever.  HENT: Negative for sore throat.   Respiratory: Negative for shortness of breath.   Cardiovascular: Negative for chest pain.  Gastrointestinal: Positive for nausea. Negative for abdominal pain and vomiting.  Genitourinary: Positive for flank pain.  Musculoskeletal: Negative for back pain.  Skin: Negative for pallor and wound.  Neurological: Negative  for light-headedness and headaches.  All other systems reviewed and are negative.   Physical Exam Updated Vital Signs BP 128/80 (BP Location: Left Arm)    Pulse 85    Temp 98.2 F (36.8 C) (Oral)    Resp 16    LMP 10/08/2011    SpO2 99%   Physical Exam Vitals and nursing note reviewed.  Constitutional:      General: She is not in acute distress.    Appearance: She is well-developed.  HENT:     Head: Normocephalic and atraumatic.     Mouth/Throat:     Pharynx: No oropharyngeal exudate.  Eyes:     Pupils: Pupils are equal, round, and reactive to light.  Cardiovascular:     Rate and Rhythm: Regular rhythm.     Heart sounds: Normal heart sounds.  Pulmonary:     Effort: Pulmonary effort is normal. No respiratory distress.     Breath sounds: Normal breath sounds.     Comments: Lungs are clear to auscultation without any wheezing, rhonchi,  rales. Abdominal:     General: Bowel sounds are normal. There is no distension.     Palpations: Abdomen is soft.     Tenderness: There is no abdominal tenderness. There is no right CVA tenderness or left CVA tenderness.     Comments: Abdomen is soft, nontender to palpation.  No CVA bilaterally.  Musculoskeletal:        General: No tenderness or deformity.     Cervical back: Normal range of motion.     Right lower leg: No edema.     Left lower leg: No edema.  Skin:    General: Skin is warm and dry.  Neurological:     Mental Status: She is alert and oriented to person, place, and time.     ED Results / Procedures / Treatments   Labs (all labs ordered are listed, but only abnormal results are displayed) Labs Reviewed  BASIC METABOLIC PANEL - Abnormal; Notable for the following components:      Result Value   Glucose, Bld 119 (*)    All other components within normal limits  URINALYSIS, ROUTINE W REFLEX MICROSCOPIC - Abnormal; Notable for the following components:   APPearance HAZY (*)    Hgb urine dipstick SMALL (*)    Bacteria, UA RARE (*)    All other components within normal limits  CBC  HEPATIC FUNCTION PANEL    EKG None  Radiology CT Renal Stone Study  Result Date: 06/06/2019 CLINICAL DATA:  Left flank pain x5 days. EXAM: CT ABDOMEN AND PELVIS WITHOUT CONTRAST TECHNIQUE: Multidetector CT imaging of the abdomen and pelvis was performed following the standard protocol without IV contrast. COMPARISON:  May 02, 2013 FINDINGS: Lower chest: No acute abnormality. Hepatobiliary: No focal liver abnormality is seen. Diffuse fatty infiltration of the liver parenchyma is noted. No gallstones, gallbladder wall thickening, or biliary dilatation. Pancreas: Unremarkable. No pancreatic ductal dilatation or surrounding inflammatory changes. Spleen: Normal in size without focal abnormality. Adrenals/Urinary Tract: Adrenal glands are unremarkable. Kidneys are normal, without renal  calculi, focal lesion, or hydronephrosis. Bladder is unremarkable. Stomach/Bowel: Stomach is within normal limits. Appendix appears normal. No evidence of bowel wall thickening, distention, or inflammatory changes. Vascular/Lymphatic: No significant vascular findings are present. No enlarged abdominal or pelvic lymph nodes. Reproductive: The uterus is normal in appearance. Other: And 8.1 cm x 6.3 cm cystic appearing area is seen within the pelvis, to the right of midline. This is located just  above the level of the urinary bladder on the right. Musculoskeletal: No acute or significant osseous findings. IMPRESSION: 1. 8.1 cm x 6.3 cm cystic appearing area within the pelvis, located just above the level of the urinary bladder on the right. This may represent a right ovarian cyst, or possibly a paraovarian cyst. Pelvic ultrasound may be helpful for further characterization. 2. Diffuse fatty infiltration of the liver. Electronically Signed   By: Virgina Norfolk M.D.   On: 06/06/2019 19:40    Procedures Procedures (including critical care time)  Medications Ordered in ED Medications - No data to display  ED Course  I have reviewed the triage vital signs and the nursing notes.  Pertinent labs & imaging results that were available during my care of the patient were reviewed by me and considered in my medical decision making (see chart for details).    MDM Rules/Calculators/A&P   Patient with a suspected history of borderline glucose intolerance presents to the ED with complaints of left flank pain which began in the month in November.  Patient was diagnosed with COVID-19 infection back in November, reports this pain was intermittent since her last infection.  Reports the pain has now been constant.  Reports the pain can radiate toes to front.  Has not taken any medication for improvement of her symptoms.  According to her chart which I extensively reviewed she did have a visit with PCP for borderline  insulin intolerance.  No A1c has been collected for patient.  On today's visit interpretation of her labs show a BMP without any electrolyte derangement, creatinine level is within her normal limit.  CBC is without any leukocytosis or any signs of anemia.  Hepatic function is within normal limits, lower suspicion for gallbladder pathology.  She does endorse some nausea however has not had any episodes of vomiting.  UA with small amount of hemoglobin and rare bacteria.  Some suspicion for MSK versus kidney stone.  Last menstrual period in 2014 after hysterectomy.  Renal CT showed  1. 8.1 cm x 6.3 cm cystic appearing area within the pelvis, located  just above the level of the urinary bladder on the right. This may  represent a right ovarian cyst, or possibly a paraovarian cyst.  Pelvic ultrasound may be helpful for further characterization.  2. Diffuse fatty infiltration of the liver.     Results were discussed with patient at length.  She does have a current PCP who she is advised to follow-up with.  She is nonill appearing, nontoxic.  I discussed results of her head CT with patient.  She is currently not experiencing any lower abdominal pain.  Denies any gynecological complaint such as vaginal discharge.  Does have a prior history of a hysterectomy no concerns for pregnancy at this time.  Discussed that she will need to follow-up with PCP.  Patient understands and agrees with management, return precautions discussed at length.   Portions of this note were generated with Lobbyist. Dictation errors may occur despite best attempts at proofreading.  Final Clinical Impression(s) / ED Diagnoses Final diagnoses:  Left flank pain  Fatigue, unspecified type    Rx / DC Orders ED Discharge Orders    None       Corinna Capra 06/06/19 2021    Noemi Chapel, MD 06/08/19 [phone]

## 2019-06-06 NOTE — Discharge Instructions (Addendum)
We discussed the results of your CT.  You will need to follow-up with your primary care physician.  If you experience any worsening symptoms, fevers he may return to the emergency room.

## 2019-06-06 NOTE — ED Triage Notes (Signed)
Pt arrives to ED from home with complaints of left flank pain x5 days. Patient denies dysuria or polyuria. Patient also explains that she been so tired everyday since getting COVID in November.

## 2019-06-06 NOTE — ED Notes (Signed)
Patient verbalizes understanding of discharge instructions. Opportunity for questioning and answers were provided. Armband removed by staff, pt discharged from ED ambulatory.   

## 2019-06-12 ENCOUNTER — Ambulatory Visit: Payer: No Typology Code available for payment source

## 2019-09-12 ENCOUNTER — Encounter (HOSPITAL_COMMUNITY): Payer: Self-pay | Admitting: Emergency Medicine

## 2019-09-12 ENCOUNTER — Ambulatory Visit (HOSPITAL_COMMUNITY)
Admission: EM | Admit: 2019-09-12 | Discharge: 2019-09-12 | Disposition: A | Discharge status: Home / Self Care | Payer: No Typology Code available for payment source | Attending: Emergency Medicine | Admitting: Emergency Medicine

## 2019-09-12 ENCOUNTER — Other Ambulatory Visit: Payer: Self-pay

## 2019-09-12 DIAGNOSIS — N760 Acute vaginitis: Secondary | ICD-10-CM | POA: Insufficient documentation

## 2019-09-12 MED ORDER — FLUCONAZOLE 150 MG PO TABS: 150.0000 mg | ORAL_TABLET | Freq: Once | ORAL | 0 refills | Status: AC

## 2019-09-12 NOTE — ED Provider Notes (Signed)
[place]    CSN: [id number] Arrival date & time: 09/12/19  4010      History   Chief Complaint Chief Complaint  Patient presents with  . Vaginal Itching    HPI Brooke Lam is a 39 y.o. female prior hysterectomy presenting today for evaluation of vaginal itching.  Symptoms began approximately 2-3 days ago.  She denies any known discharge.  Denies abdominal pain or pelvic pain.  Denies any rashes or lesions.  She tried over-the-counter Monistat.  Denies urinary symptoms.  Denies fevers, nausea or vomiting.  Does express concern for possible STD as her husband is a Administrator and is often traveling.  HPI  Past Medical History:  Diagnosis Date  . Ectopic pregnancy   . Preterm labor     Patient Active Problem List   Diagnosis Date Noted  . 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 Dyspareunia 11/12/2014  . S/P emergency cesarean hysterectomy 08/01/2012  . Dysuria 09/17/2011    Past Surgical History:  Procedure Laterality Date  . ABDOMINAL HYSTERECTOMY    . BREAST BIOPSY Right 2013   FA  . CESAREAN SECTION     C/S x 4  . cesarian section      OB History    Gravida  7   Para  5   Term  3   Preterm  2   AB  2   Living  4     SAB  1   TAB  0   Ectopic  1   Multiple  0   Live Births  4            Home Medications    Prior to Admission medications   Medication Sig Start Date End Date Taking? Authorizing Provider  fluconazole (DIFLUCAN) 150 MG tablet Take 1 tablet (150 mg total) by mouth once for 1 dose. 09/12/19 09/12/19  Eveleen Mcnear, Elesa Hacker, PA-C    Family History Family History  Problem Relation Age of Onset  . Diabetes Father   . Diabetes Paternal Grandmother   . Anesthesia problems Neg Hx   . Hearing loss Neg Hx   . Other Neg Hx     Social History Social History   Tobacco Use  . Smoking status: Former Smoker    Types: Cigarettes  . Smokeless tobacco: Never Used  . Tobacco comment: quit 2005    Substance Use Topics  . Alcohol use: No    Alcohol/week: 0.0 standard drinks  . Drug use: No     Allergies   Patient has no known allergies.   Review of Systems Review of Systems  Constitutional: Negative for fever.  Respiratory: Negative for shortness of breath.   Cardiovascular: Negative for chest pain.  Gastrointestinal: Negative for abdominal pain, diarrhea, nausea and vomiting.  Genitourinary: Negative for dysuria, flank pain, genital sores, hematuria, menstrual problem, vaginal bleeding, vaginal discharge and vaginal pain.       Vaginal itching  Musculoskeletal: Negative for back pain.  Skin: Negative for rash.  Neurological: Negative for dizziness, light-headedness and headaches.     Physical Exam Triage Vital Signs ED Triage Vitals  Enc Vitals Group     BP 09/12/19 0814 (!) 139/94     Pulse Rate 09/12/19 0814 65     Resp 09/12/19 0814 16     Temp 09/12/19 0814 98.7 F (37.1 C)     Temp Source 09/12/19 0814 Oral     SpO2 09/12/19 0814 98 %  Weight --      Height --      Head Circumference --      Peak Flow --      Pain Score 09/12/19 0815 5     Pain Loc --      Pain Edu? --      Excl. in [place]? --    No data found.  Updated Vital Signs BP (!) 139/94   Pulse 65   Temp 98.7 F (37.1 C) (Oral)   Resp 16   LMP 10/08/2011   SpO2 98%   Visual Acuity Right Eye Distance:   Left Eye Distance:   Bilateral Distance:    Right Eye Near:   Left Eye Near:    Bilateral Near:     Physical Exam Vitals and nursing note reviewed.  Constitutional:      Appearance: She is well-developed.     Comments: No acute distress  HENT:     Head: Normocephalic and atraumatic.     Nose: Nose normal.  Eyes:     Conjunctiva/sclera: Conjunctivae normal.  Cardiovascular:     Rate and Rhythm: Normal rate.  Pulmonary:     Effort: Pulmonary effort is normal. No respiratory distress.  Abdominal:     General: There is no distension.  Musculoskeletal:        General:  Normal range of motion.     Cervical back: Neck supple.  Skin:    General: Skin is warm and dry.  Neurological:     Mental Status: She is alert and oriented to person, place, and time.      UC Treatments / Results  Labs (all labs ordered are listed, but only abnormal results are displayed) Labs Reviewed  CERVICOVAGINAL ANCILLARY ONLY    EKG   Radiology No results found.  Procedures Procedures (including critical care time)  Medications Ordered in UC Medications - No data to display  Initial Impression / Assessment and Plan / UC Course  I have reviewed the triage vital signs and the nursing notes.  Pertinent labs & imaging results that were available during my care of the patient were reviewed by me and considered in my medical decision making (see chart for details).     Vaginal swab pending to further evaluate for cause of symptoms.  Empirically treating for yeast today with Diflucan.  Will call with results and alter treatment as needed.  Discussed strict return precautions. Patient verbalized understanding and is agreeable with plan.  Final Clinical Impressions(s) / UC Diagnoses   Final diagnoses:  Vaginitis and vulvovaginitis     Discharge Instructions     1 tab of diflucan for yeast, repeat in 3-4 days if still having symptoms  We are testing you for Gonorrhea, Chlamydia, Trichomonas, Yeast and Bacterial Vaginosis. We will call you if anything is positive and let you know if you require any further treatment. Please inform partners of any positive results.   Please return if symptoms not improving with treatment, development of fever, nausea, vomiting, abdominal pain.    ED Prescriptions    Medication Sig Dispense Auth. Provider   fluconazole (DIFLUCAN) 150 MG tablet Take 1 tablet (150 mg total) by mouth once for 1 dose. 2 tablet Analei Whinery, [place] C, PA-C     PDMP not reviewed this encounter.   Janith Lima, [place] 09/12/19 [phone]

## 2019-09-12 NOTE — Discharge Instructions (Signed)
1 tab of diflucan for yeast, repeat in 3-4 days if still having symptoms  We are testing you for Gonorrhea, Chlamydia, Trichomonas, Yeast and Bacterial Vaginosis. We will call you if anything is positive and let you know if you require any further treatment. Please inform partners of any positive results.   Please return if symptoms not improving with treatment, development of fever, nausea, vomiting, abdominal pain.

## 2019-09-12 NOTE — ED Triage Notes (Signed)
Vaginal itching started Saturday. She has been using an OTC cream and is unsure if she has discharge. Denies dysuria and abdominal pain. Reports low back pain.

## 2019-09-13 ENCOUNTER — Telehealth (HOSPITAL_COMMUNITY): Payer: Self-pay | Admitting: Orthopedic Surgery

## 2019-09-13 LAB — CERVICOVAGINAL ANCILLARY ONLY
Bacterial Vaginitis (gardnerella): POSITIVE — AB
Candida Glabrata: NEGATIVE
Candida Vaginitis: NEGATIVE
Chlamydia: NEGATIVE
Comment: NEGATIVE
Comment: NEGATIVE
Comment: NEGATIVE
Comment: NEGATIVE
Comment: NEGATIVE
Comment: NORMAL
Neisseria Gonorrhea: NEGATIVE
Trichomonas: NEGATIVE

## 2019-09-13 MED ORDER — METRONIDAZOLE 500 MG PO TABS: 500.0000 mg | ORAL_TABLET | Freq: Two times a day (BID) | ORAL | 0 refills | Status: DC

## 2019-09-20 ENCOUNTER — Emergency Department (HOSPITAL_COMMUNITY): Payer: No Typology Code available for payment source

## 2019-09-20 ENCOUNTER — Telehealth: Payer: Self-pay | Admitting: *Deleted

## 2019-09-20 ENCOUNTER — Emergency Department (HOSPITAL_COMMUNITY)
Admission: EM | Admit: 2019-09-20 | Discharge: 2019-09-21 | Disposition: A | Discharge status: Home / Self Care | Payer: No Typology Code available for payment source | Attending: Emergency Medicine | Admitting: Emergency Medicine

## 2019-09-20 ENCOUNTER — Other Ambulatory Visit: Payer: Self-pay

## 2019-09-20 ENCOUNTER — Encounter (HOSPITAL_COMMUNITY): Payer: Self-pay | Admitting: Emergency Medicine

## 2019-09-20 DIAGNOSIS — R0789 Other chest pain: Secondary | ICD-10-CM | POA: Insufficient documentation

## 2019-09-20 DIAGNOSIS — Z5321 Procedure and treatment not carried out due to patient leaving prior to being seen by health care provider: Secondary | ICD-10-CM | POA: Insufficient documentation

## 2019-09-20 LAB — BASIC METABOLIC PANEL
Anion gap: 9 (ref 5–15)
BUN: 20 mg/dL (ref 6–20)
CO2: 26 mmol/L (ref 22–32)
Calcium: 9.4 mg/dL (ref 8.9–10.3)
Chloride: 106 mmol/L (ref 98–111)
Creatinine, Ser: 0.8 mg/dL (ref 0.44–1.00)
GFR calc Af Amer: >60 mL/min (ref >60)
GFR calc non Af Amer: >60 mL/min (ref >60)
Glucose, Bld: 117 mg/dL — ABNORMAL HIGH (ref 70–99)
Potassium: 4.1 mmol/L (ref 3.5–5.1)
Sodium: 141 mmol/L (ref 135–145)

## 2019-09-20 LAB — CBC
HCT: 38 % (ref 36.0–46.0)
Hemoglobin: 11.9 g/dL — ABNORMAL LOW (ref 12.0–15.0)
MCH: 27 pg (ref 26.0–34.0)
MCHC: 31.3 g/dL (ref 30.0–36.0)
MCV: 86.2 fL (ref 80.0–100.0)
Platelets: 252 10*3/uL (ref 150–400)
RBC: 4.41 MIL/uL (ref 3.87–5.11)
RDW: 13.4 % (ref 11.5–15.5)
WBC: 7.5 10*3/uL (ref 4.0–10.5)
nRBC: 0 % (ref 0.0–0.2)

## 2019-09-20 LAB — TROPONIN I (HIGH SENSITIVITY): Troponin I (High Sensitivity): 2 ng/L (ref <18)

## 2019-09-20 MED ORDER — SODIUM CHLORIDE 0.9% FLUSH: 3.0000 mL | Freq: Once | INTRAVENOUS | Status: DC

## 2019-09-20 NOTE — Telephone Encounter (Signed)
Thayer Headings from our check out are called back into triage today stating the pt walked complaining of chest pain. Pt was last seen 2017 with Dr. Tamala Julian. Pt was asking Caryl Pina for her to be seen today. I explained to check out that the pt will need to go to the ED with her symptoms, though I will send a triage nurse up to evaluate further. I also stated to check out that the pt if they do want to re-est with our practice will need to make an appt and will need to be seen as a New Pt appt. I will send my notes to triage nurse to document her notes after assessing the pt.

## 2019-09-20 NOTE — Telephone Encounter (Signed)
Michae Kava, CMA 3 minutes ago (3:31 PM)     Brooke Lam from our check out are called back into triage today stating the pt walked complaining of chest pain. Pt was last seen 2017 with Dr. Tamala Julian. Pt was asking Caryl Pina for her to be seen today. I explained to check out that the pt will need to go to the ED with her symptoms, though I will send a triage nurse up to evaluate further. I also stated to check out that the pt if they do want to re-est with our practice will need to make an appt and will need to be seen as a New Pt appt. I will send my notes to triage nurse to document her notes after assessing the pt.       Documentation    Notified by Arbie Cookey of the above phone call she received. This RN went to the lobby to speak with the patient.She is complaining of left arm pain that radiates to her neck as well as chest pain. This has been going on since yesterday and she is concerned. I advised the patient that she should go to the [facility] ED across the street in order to be evaluated as we do not have the resources to properly treat her in this office. She verbalized understanding.

## 2019-09-20 NOTE — ED Triage Notes (Signed)
Pt c/o left sided chest pain that radiates to her left arm and neck, also c/o shortness of breath. Symptoms started yesterday and have been constant.

## 2019-09-20 NOTE — ED Notes (Signed)
Called for recheck VS x3, no response 

## 2019-12-05 ENCOUNTER — Ambulatory Visit (INDEPENDENT_AMBULATORY_CARE_PROVIDER_SITE_OTHER): Payer: No Typology Code available for payment source | Admitting: Family Medicine

## 2019-12-05 ENCOUNTER — Encounter: Payer: Self-pay | Admitting: Family Medicine

## 2019-12-05 ENCOUNTER — Other Ambulatory Visit: Payer: Self-pay

## 2019-12-05 ENCOUNTER — Ambulatory Visit (INDEPENDENT_AMBULATORY_CARE_PROVIDER_SITE_OTHER): Payer: Self-pay

## 2019-12-05 DIAGNOSIS — M25562 Pain in left knee: Secondary | ICD-10-CM

## 2019-12-05 NOTE — Progress Notes (Signed)
Office Visit Note   Patient: Brooke Lam           Date of Birth: [birth year]           MRN: [id number] Visit Date: 12/05/2019 Requested by: Antony Blackbird, MD [place],  [place] [address] PCP: Antony Blackbird, MD  Subjective: Chief Complaint  Patient presents with   Left Knee - Pain    H/o fall onto left knee in 04/2018. Has had intermittent pain in the anterior knee/occ posterior knee, especially after she walks for exercise or at the end of the work day. Occ swelling anterior knee.     HPI: She is here for left knee pain.  In February 2020 she was getting out of her car, slipped on ice and fell landing directly on the front of both knees.  The left knee has been bothering her since then.  It hurts after walking a lot, and sometimes when squatting and pivoting.  No locking, occasional swelling.  She does not take medication for her pain.  No previous problems with her knee.               ROS:   All other systems were reviewed and are negative.  Objective: Vital Signs: LMP 10/08/2011   Physical Exam:  General:  Alert and oriented, in no acute distress. Pulm:  Breathing unlabored. Psy:  Normal mood, congruent affect. Skin: No rash Left knee: No effusion today.  1+ patellofemoral crepitus in both knees.  Lachman's feels solid, no pain with patellar apprehension or compression.  No laxity with varus or valgus stress.  She is tender on the posterior medial joint line and has pain but no definite click with McMurray's.  Imaging: XR KNEE 3 VIEW LEFT  Result Date: 12/05/2019 X-rays of the left knee reveal well-preserved joint space, no sign of loose body or OCD.  No significant arthritic change.   Assessment & Plan: 1.  Chronic left knee pain, concerning for medial meniscus tear. -We discussed options and elected to proceed with MRI scan.  Depending on results, consider surgical consult for arthroscopy versus a one-time cortisone  injection.     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   Dyspareunia 11/12/2014   S/P emergency cesarean hysterectomy 08/01/2012   Dysuria 09/17/2011   Past Medical History:  Diagnosis Date   Ectopic pregnancy    Preterm labor     Family History  Problem Relation Age of Onset   Diabetes Father    Diabetes Paternal Grandmother    Anesthesia problems Neg Hx    Hearing loss Neg Hx    Other Neg Hx     Past Surgical History:  Procedure Laterality Date   ABDOMINAL HYSTERECTOMY     BREAST BIOPSY Right 2013   FA   CESAREAN SECTION     C/S x 4   cesarian section     Social History   Occupational History   Not on file  Tobacco Use   Smoking status: Former Smoker    Types: Cigarettes   Smokeless tobacco: Never Used   Tobacco comment: quit 2005  Substance and Sexual Activity   Alcohol use: No    Alcohol/week: 0.0 standard drinks   Drug use: No   Sexual activity: Yes    Birth control/protection: None, Surgical

## 2019-12-05 NOTE — Patient Instructions (Signed)
Ruptura de meniscos Meniscus Tear  La ruptura de meniscos es una lesin en la rodilla que se produce cuando se rompe un fragmento del menisco. El menisco es un cartlago grueso y gomoso en forma de cua que se encuentra en la rodilla. En cada rodilla hay dos meniscos. Se alojan entre el hueso superior (fmur) y el inferior (tibia) que forman la articulacin de la rodilla. Cada menisco acta como un amortiguador de la rodilla. La ruptura de meniscos es uno de los tipos de lesiones ms frecuentes de la rodilla. Puede ser leve o grave. Para reparar Ardelia Mems rotura grave, tal vez haya que realizar Clementeen Hoof. Cules son las causas? Cualquier movimiento puede causar esta afeccin, por ejemplo, arrodillarse, ponerse en cuclillas, rotar o girar [employer]. Por lo general, las rupturas de meniscos suelen se causadas por lesiones deportivas. Estas suelen producirse al:  Optometrist y detenerse de Ward repentina. ? Cambiar de direccin. ? Recibir un tacle o ser derribado.  Levantar o cargar objetos pesados. A medida que las personas envejecen, los meniscos se vuelven ms finos y ms dbiles. En estas personas, las rupturas pueden ocurrir con ms facilidad, por ejemplo, al subir escaleras. Qu incrementa el riesgo? Es ms probable que tenga esta afeccin si:  Practica deportes de contacto.  Tiene un trabajo que requiere arrodillarse o ponerse en cuclillas.  Es hombre.  Es mayor de 40aos. Cules son los signos o los sntomas? Los sntomas de esta afeccin incluyen:  Dolor en la rodilla, especialmente al costado de la articulacin. Puede sentir dolor al producirse la lesin o solo escuchar un estallido y sentir el dolor ms tarde.  Sensacin de que la rodilla cruje, se engancha, se traba o se afloja.  Imposibilidad de flexionar o extender la rodilla por completo.  Moretones o hinchazn en la rodilla. Cmo se diagnostica? Esta afeccin se puede diagnosticar en funcin de los sntomas y de  un examen fsico. Tambin pueden hacerle estudios, por ejemplo:  Radiografas.  Resonancia magntica (RM).  Un procedimiento para examinar el interior de la rodilla con un pequeo endoscopio quirrgico (artroscopa). Pueden derivarlo a un especialista en rodilla (cirujano ortopdico). Cmo se trata? El tratamiento depende de la gravedad de la lesin. El tratamiento para una rotura leve puede incluir lo siguiente:  Reposo.  Medicamentos para Best boy y reducir la hinchazn. Generalmente, se trata de un antiinflamatorio no esteroideo (AINE), como ibuprofeno.  Un dispositivo ortopdico, una rodillera o una venda.  Usar [employer] o un andador para quitar peso de la rodilla y ayudarlo a Writer.  Ejercicios para fortalecer la rodilla (fisioterapia). Tal vez deba someterse a una ciruga si la ruptura es grave o si otros tratamientos no resultan eficaces. Siga estas indicaciones en su casa: Si tiene un dispositivo ortopdico, una rodillera o una venda:  selos como se lo haya indicado el mdico. Quteselos solamente como se lo haya indicado el mdico.  Afloje el dispositivo ortopdico, rodillera o venda si los dedos de los pies se le adormecen, siente hormigueos o se le enfran y se tornan de Optician, dispensing.  Mantenga limpio y seco el dispositivo ortopdico, rodillera o venda.  Si el dispositivo ortopdico, rodillera o venda no es impermeable: ? No deje que se moje. ? Cbralo con un envoltorio hermtico cuando tome un bao de inmersin o [place]. Control del dolor y [place] los medicamentos de venta libre y los recetados solamente como se lo haya indicado el mdico.  Si se lo  indican, aplique hielo sobre la rodilla: ? Si tiene un dispositivo ortopdico, rodillera o venda desmontable, quteselo como se lo haya indicado el mdico. ? Ponga el hielo en una bolsa plstica. ? Coloque una [employer] piel y Therapist, nutritional. ? Coloque el hielo durante 40minutos,  2a3veces al da.  Mueva los dedos del pie con frecuencia para evitar que se pongan rgidos y para reducir la hinchazn.  Cuando est sentado o acostado, levante (eleve) la zona lesionada por encima del nivel del corazn. Actividad  No apoye el peso del cuerpo sobre la extremidad [employer] que lo autorice el mdico. Use muletas o un andadorcomo se lo haya indicado el mdico.  Retome sus actividades normales como se lo haya indicado el mdico. Pregntele al mdico qu actividades son seguras para usted.  Haga ejercicios de flexibilidad solamente como se lo haya indicado el mdico.  Comience a Field seismologist los ejercicios para fortalecer la rodilla y los msculos de la pierna solamente como se lo haya indicado el mdico. Despus de que se recupere, el mdico puede recomendarle estos ejercicios para ayudar a Product/process development scientist que se produzca otra lesin. Indicaciones generales  Use un dispositivo ortopdico, una rodillera o una venda como se lo haya indicado el mdico.  Pregntele al mdico cundo puede volver a conducir si tiene un dispositivo ortopdico, una rodillera o una venda en la pierna.  No consuma ningn producto que contenga nicotina o tabaco, como cigarrillos, cigarrillos electrnicos y tabaco de Higher education careers adviser. Si necesita ayuda para dejar de fumar, consulte al [employer].  Pregntele al mdico si el medicamento recetado: ? Hace que sea necesario no conducir ni usar maquinaria pesada. ? Puede causarle estreimiento. Es posible que tenga que tomar estas medidas para prevenir o tratar el estreimiento:  Electronics engineer suficiente lquido como para Theatre manager la orina de color amarillo plido.  Tomar medicamentos recetados o de [employer].  Consumir alimentos ricos en fibra, como frijoles, cereales integrales, y frutas y verduras frescas.  Limitar el consumo de alimentos ricos en grasa y azcares procesados, como los alimentos fritos o dulces.  Concurra a todas las visitas de seguimiento como se lo haya  indicado el mdico. Esto es importante. Comunquese con un mdico si:  Tiene fiebre.  La rodilla est enrojecida, hinchada o le duele.  El medicamento no Production designer, theatre/television/film.  Los sntomas empeoran o no mejoran despus de 2semanas de Multimedia programmer. Resumen  La ruptura de meniscos es una lesin en la rodilla que se produce cuando se rompe un fragmento del menisco.  El tratamiento depende de la gravedad de la lesin. Tal vez deba someterse a una ciruga si la ruptura es grave o si otros tratamientos no resultan eficaces.  Haga reposo, aplquese hielo y levante (eleve) la rodilla lesionada como se lo haya indicado el mdico. Esto ayudar a Therapist, occupational y la inflamacin.  Comunquese con un mdico si tiene sntomas nuevos o si sus sntomas empeoran o no mejoran despus de 2 semanas de cuidados en el hogar.  Concurra a todas las visitas de seguimiento como se lo haya indicado el mdico. Esto es importante. Esta informacin no tiene Marine scientist el consejo del mdico. Asegrese de hacerle al mdico cualquier pregunta que tenga. Document Revised: 11/10/2017 Document Reviewed: 11/10/2017 Elsevier Patient Education  2020 [employer].

## 2019-12-08 ENCOUNTER — Ambulatory Visit
Admission: RE | Admit: 2019-12-08 | Discharge: 2019-12-08 | Disposition: A | Discharge status: Home / Self Care | Payer: No Typology Code available for payment source | Source: Ambulatory Visit | Attending: Family Medicine | Admitting: Family Medicine

## 2019-12-08 ENCOUNTER — Other Ambulatory Visit: Payer: Self-pay

## 2019-12-08 DIAGNOSIS — M25562 Pain in left knee: Secondary | ICD-10-CM

## 2019-12-11 ENCOUNTER — Telehealth: Payer: Self-pay | Admitting: Family Medicine

## 2019-12-11 NOTE — Telephone Encounter (Signed)
Would you please call this one, as her native tongue is Spanish?

## 2019-12-11 NOTE — Telephone Encounter (Signed)
MRI does not show a definite meniscus cartilage tear.  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  Treatment options would include:  - Trial of physical therapy.  - A one-time cortisone injection.

## 2019-12-13 ENCOUNTER — Other Ambulatory Visit: Payer: Self-pay | Admitting: Family Medicine

## 2019-12-13 ENCOUNTER — Telehealth: Payer: Self-pay

## 2019-12-13 ENCOUNTER — Encounter: Payer: Self-pay | Admitting: Family Medicine

## 2019-12-13 DIAGNOSIS — M25562 Pain in left knee: Secondary | ICD-10-CM

## 2019-12-13 NOTE — Telephone Encounter (Signed)
Patient aware she would like to do PT in our office. She can come in any time and day. States she works at night.

## 2019-12-13 NOTE — Telephone Encounter (Signed)
Please see Kathlee Nations' message in this strand.

## 2019-12-13 NOTE — Telephone Encounter (Signed)
error 

## 2019-12-13 NOTE — Telephone Encounter (Signed)
Please see Kathlee Nations' note - the patient would like to try PT here.

## 2019-12-13 NOTE — Telephone Encounter (Signed)
Got it.  Orders placed for PT.

## 2019-12-29 ENCOUNTER — Ambulatory Visit (INDEPENDENT_AMBULATORY_CARE_PROVIDER_SITE_OTHER): Payer: Self-pay | Admitting: Rehabilitative and Restorative Service Providers"

## 2019-12-29 ENCOUNTER — Other Ambulatory Visit: Payer: Self-pay

## 2019-12-29 ENCOUNTER — Encounter: Payer: Self-pay | Admitting: Rehabilitative and Restorative Service Providers"

## 2019-12-29 DIAGNOSIS — M25662 Stiffness of left knee, not elsewhere classified: Secondary | ICD-10-CM

## 2019-12-29 DIAGNOSIS — M25562 Pain in left knee: Secondary | ICD-10-CM

## 2019-12-29 DIAGNOSIS — G8929 Other chronic pain: Secondary | ICD-10-CM

## 2019-12-29 DIAGNOSIS — R600 Localized edema: Secondary | ICD-10-CM

## 2019-12-29 NOTE — Patient Instructions (Signed)
Access Code: [email or web address] URL: [email or web address] Date: 12/29/2019 Prepared by: Vista Mink  Exercises Small Range Straight Leg Raise - 1 x daily - 7 x weekly - 10-20 sets - 5 reps - 3 secondsinutes hold Supine Quadricep Sets - 2-3 x daily - 7 x weekly - 10 sets - 10 reps - 5 hold

## 2019-12-29 NOTE — Therapy (Signed)
[facility] Physical Therapy [address] [place], [place], [address] Phone: [phone]   Fax:  [phone]  Physical Therapy Evaluation  Patient Details  Name: Brooke Lam MRN: [id number] Date of Birth: May 26, 1980 Referring Provider (PT): Eunice Blase MD   Encounter Date: 12/29/2019   PT End of Session - 12/29/19 1455    Visit Number 1    Number of Visits 8    Progress Note Due on Visit 0    PT Start Time 1400    PT Stop Time 1441    PT Time Calculation (min) 41 min    Activity Tolerance No increased pain;Patient tolerated treatment well    Behavior During Therapy [facility] for tasks assessed/performed           Past Medical History:  Diagnosis Date  . Ectopic pregnancy   . Preterm labor     Past Surgical History:  Procedure Laterality Date  . ABDOMINAL HYSTERECTOMY    . BREAST BIOPSY Right 2013   FA  . CESAREAN SECTION     C/S x 4  . cesarian section      There were no vitals filed for this visit.    Subjective Assessment - 12/29/19 1448    Subjective Meleah has had L knee pain since she fell on ice earlier this year.  MRI was normal with the exception of partial-thickness cartilage loss of the medial patellar facet with subchondral reactive marrow changes.    Limitations Walking    How long can you stand comfortably? 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    How long can you walk comfortably? Depends.  Sometimes 3-4 miles, sometimes only a few steps.    Patient Stated Goals Be able to walk and work without L knee pain.    Currently in Pain? 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    Pain Relieving Factors None.    Effect of Pain on Daily Activities Limits walking for BP and  diabetes management.    Multiple Pain Sites No              [facility] PT Assessment - 12/29/19 0001      Assessment   Medical Diagnosis L knee pain     Referring Provider (PT) Eunice Blase MD    Onset Date/Surgical Date --   [place] on ice over the winter earlier in 2021     [place]   Has the patient fallen in the past 6 months No    Has the patient had a decrease in activity level because of a fear of falling?  No    Is the patient reluctant to leave their home because of a fear of falling?  No      Prior Function   Level of Independence Independent      Cognition   Overall Cognitive Status Within Functional Limits for tasks assessed      ROM / Strength   AROM / PROM / Strength AROM;Strength      AROM   Overall AROM  Deficits    AROM Assessment Site Knee    Right/Left Knee Right;Left  Right Knee Extension 0    Right Knee Flexion 136    Left Knee Extension 0    Left Knee Flexion 124      Strength   Overall Strength Deficits    Strength Assessment Site Knee    Right/Left Knee Left;Right    Right Knee Extension 5/5    Left Knee Extension 4/5                      Objective measurements completed on examination: See above findings.       [facility] Adult PT Treatment/Exercise - 12/29/19 0001      Exercises   Exercises Knee/Hip      Knee/Hip Exercises: Seated   Long Arc Quad Strengthening;Both;3 sets;5 reps   slow eccentrics     Knee/Hip Exercises: Supine   Quad Sets Strengthening;Both;2 sets;10 reps   5 seconds                 PT Education - 12/29/19 1454    Education Details Discussed importance of quadriceps strengthening to reduce joint compressive forces, reduce edema and improve pain.  Prescribed and reviewed an appropriate HEP.    Person(s) Educated Patient    Methods Explanation;Demonstration;Tactile cues;Verbal cues;Handout    Comprehension Verbal cues required;Need further instruction;Returned demonstration;Verbalized  understanding;Tactile cues required               PT Long Term Goals - 12/29/19 1502      PT LONG TERM GOAL #1   Title Tonnia will have an improved score on the FOTO functional assessment (website down as of evaluation completion, specific score will be reported on follow-up).    Baseline Obtained but unavailable due to website down.    Time 8    Period Weeks    Status New    Target Date 02/20/20      PT LONG TERM GOAL #2   Title Pailynn will report L knee pain consistently 0-2/10 on the Numeric Pain Rating Scale.    Baseline Can be 5/10    Time 8    Period Weeks    Status New    Target Date 02/20/20      PT LONG TERM GOAL #3   Title Para will have 135 degrees of L knee flexion at DC.    Baseline 124 degrees    Time 8    Period Weeks    Status New    Target Date 02/10/20      PT LONG TERM GOAL #4   Title Janye will have 5/5 MMT for L quadriceps at DC.    Baseline 4/5    Time 8    Period Weeks    Status New    Target Date 02/20/20      PT LONG TERM GOAL #5   Title Regan will be independent with her long-term HEP at DC.    Time 8    Period Weeks    Status New    Target Date 02/20/20                  Plan - 12/29/19 1457    Clinical Impression Statement Annalucia has had L knee pain since slipping on the ice in early 2021.  MRI was normal with the exception of some medial patellar cartillage loss.  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  This suggests quadriceps weakness and this  was noted with her therapeutic exercises today.  Her prognosis to meet LTGs is good with the recommended POC.    Examination-Activity Limitations Squat;Stairs    Examination-Participation Restrictions Occupation;Community Activity    Stability/Clinical Decision Making Stable/Uncomplicated    Clinical Decision Making Low    Rehab Potential Good    PT Frequency Other (comment)   1X next week to review  and correct HEP before follow-ups as needed and RA in 6-8 weeks.   PT Duration 8 weeks    PT Treatment/Interventions ADLs/Self Care Home Management;Cryotherapy;Therapeutic activities;Stair training;Therapeutic exercise;Neuromuscular re-education;Patient/family education    PT Next Visit Plan Review HEP for independent management.    PT Home Exercise Plan Access Code: [email or web address]    Consulted and Agree with Plan of Care Patient           Patient will benefit from skilled therapeutic intervention in order to improve the following deficits and impairments:  Decreased activity tolerance, Decreased endurance, Decreased range of motion, Decreased strength, Increased edema, Pain  Visit Diagnosis: Localized edema  Stiffness of left knee, not elsewhere classified  Chronic pain of left knee     Problem List Patient Active Problem List   Diagnosis Date Noted  . 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 Dyspareunia 11/12/2014  . S/P emergency cesarean hysterectomy 08/01/2012  . Dysuria 09/17/2011    Farley Ly PT, MPT 12/29/2019, 3:09 PM  [facility] Physical Therapy [address] [place], [place], [address] Phone: [phone]   Fax:  [phone]  Name: Faithann Natal MRN: [id number] Date of Birth: 1980/05/06

## 2020-01-02 ENCOUNTER — Encounter: Payer: Self-pay | Admitting: Rehabilitative and Restorative Service Providers"

## 2020-01-02 ENCOUNTER — Other Ambulatory Visit: Payer: Self-pay

## 2020-01-02 ENCOUNTER — Ambulatory Visit (INDEPENDENT_AMBULATORY_CARE_PROVIDER_SITE_OTHER): Payer: Self-pay | Admitting: Rehabilitative and Restorative Service Providers"

## 2020-01-02 DIAGNOSIS — M25662 Stiffness of left knee, not elsewhere classified: Secondary | ICD-10-CM

## 2020-01-02 DIAGNOSIS — G8929 Other chronic pain: Secondary | ICD-10-CM

## 2020-01-02 DIAGNOSIS — R600 Localized edema: Secondary | ICD-10-CM

## 2020-01-02 DIAGNOSIS — M25562 Pain in left knee: Secondary | ICD-10-CM

## 2020-01-02 NOTE — Therapy (Signed)
[facility] Physical Therapy [address] [place] [address] [place], [place], [address] Phone: [phone]   Fax:  [phone]  Physical Therapy Treatment  Patient Details  Name: Brooke Lam MRN: [id number] Date of Birth: [birth year] Referring Provider (PT): Eunice Blase MD   Encounter Date: 01/02/2020   PT End of Session - 01/02/20 1109    Visit Number 2    Number of Visits 8    Date for PT Re-Evaluation 02/10/20    Progress Note Due on Visit 10    PT Start Time 1110    PT Stop Time 1140    PT Time Calculation (min) 30 min    Activity Tolerance Patient tolerated treatment well    Behavior During Therapy [facility] for tasks assessed/performed           Past Medical History:  Diagnosis Date  . Ectopic pregnancy   . Preterm labor     Past Surgical History:  Procedure Laterality Date  . ABDOMINAL HYSTERECTOMY    . BREAST BIOPSY Right 2013   FA  . CESAREAN SECTION     C/S x 4  . cesarian section      There were no vitals filed for this visit.   Subjective Assessment - 01/02/20 1111    Subjective Pt. indicated she has been doing good but has not been working.  Pt. stated doing exercise but not as much.  Denied pain upon arrival today.    Patient is accompained by: Interpreter    Limitations Walking    How long can you stand comfortably? 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    How long can you walk comfortably? Depends.  Sometimes 3-4 miles, sometimes only a few steps.    Patient Stated Goals Be able to walk and work without L knee pain.    Currently in Pain? No/denies    Pain Score 0-No pain    Pain Location Knee    Pain Orientation Left    Pain Onset More than a month ago                             [facility] Adult PT Treatment/Exercise - 01/02/20 0001      Exercises   Exercises Other Exercises (P)       Knee/Hip Exercises: Seated   Other Seated Knee/Hip Exercises seated SLR 2 x 10 Lt LE    Sit to Sand without UE  support;15 reps (P)    eccentric focus 18 inch mat     Knee/Hip Exercises: Supine   Other Supine Knee/Hip Exercises quad set c SLR x 10 Lt LE, heel slide 2 sec hold x 10 Lt LE      Manual Therapy   Manual therapy comments seated distraction, IR, flexion Lt knee MWM for flexion mobility                       PT Long Term Goals - 12/29/19 1502      PT LONG TERM GOAL #1   Title [name] will have an improved score on the FOTO functional assessment (website down as of evaluation completion, specific score will be reported on follow-up).    Baseline Obtained but unavailable due to website down.    Time 8    Period Weeks    Status New    Target Date 02/20/20      PT LONG TERM GOAL #2  Title [name] will report L knee pain consistently 0-2/10 on the Numeric Pain Rating Scale.    Baseline Can be 5/10    Time 8    Period Weeks    Status New    Target Date 02/20/20      PT LONG TERM GOAL #3   Title [name] will have 135 degrees of L knee flexion at DC.    Baseline 124 degrees    Time 8    Period Weeks    Status New    Target Date 02/10/20      PT LONG TERM GOAL #4   Title [name] will have 5/5 MMT for L quadriceps at DC.    Baseline 4/5    Time 8    Period Weeks    Status New    Target Date 02/20/20      PT LONG TERM GOAL #5   Title [name] will be independent with her long-term HEP at DC.    Time 8    Period Weeks    Status New    Target Date 02/20/20                 Plan - 01/02/20 1124    Clinical Impression Statement Lt knee tightness/pain reported at end range flexion actively.  Passive assessment revealed no jt restriction to end feel today.  Continued strengthening/endurance improvement throughout Lt LE indicated at this time.    Examination-Activity Limitations Squat;Stairs    Examination-Participation Restrictions Occupation;Community Activity    Stability/Clinical Decision Making Stable/Uncomplicated    Rehab Potential Good    PT Frequency Other  (comment)   1X next week to review and correct HEP before follow-ups as needed and RA in 6-8 weeks.   PT Duration 8 weeks    PT Treatment/Interventions ADLs/Self Care Home Management;Cryotherapy;Therapeutic activities;Stair training;Therapeutic exercise;Neuromuscular re-education;Patient/family education    PT Next Visit Plan Continue strength program, dynamic stability as appopriate.    PT Home Exercise Plan Access Code: [email or web address]    Consulted and Agree with Plan of Care Patient           Patient will benefit from skilled therapeutic intervention in order to improve the following deficits and impairments:  Decreased activity tolerance, Decreased endurance, Decreased range of motion, Decreased strength, Increased edema, Pain  Visit Diagnosis: Chronic pain of left knee  Stiffness of left knee, not elsewhere classified  Localized edema     Problem List Patient Active Problem List   Diagnosis Date Noted  . 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 Dyspareunia 11/12/2014  . S/P emergency cesarean hysterectomy 08/01/2012  . Dysuria 09/17/2011    Scot Jun, PT, DPT, OCS, ATC 01/02/20  11:32 AM    [facility] Physical Therapy [address] [place], [place], [address] Phone: [phone]   Fax:  [phone]  Name: Brooke Lam MRN: [id number] Date of Birth: 21-Jul-1980

## 2020-01-08 ENCOUNTER — Encounter: Payer: No Typology Code available for payment source | Admitting: Rehabilitative and Restorative Service Providers"

## 2020-01-17 ENCOUNTER — Ambulatory Visit (INDEPENDENT_AMBULATORY_CARE_PROVIDER_SITE_OTHER): Payer: Self-pay | Admitting: Rehabilitative and Restorative Service Providers"

## 2020-01-17 ENCOUNTER — Other Ambulatory Visit: Payer: Self-pay

## 2020-01-17 ENCOUNTER — Encounter: Payer: Self-pay | Admitting: Rehabilitative and Restorative Service Providers"

## 2020-01-17 DIAGNOSIS — G8929 Other chronic pain: Secondary | ICD-10-CM

## 2020-01-17 DIAGNOSIS — M25562 Pain in left knee: Secondary | ICD-10-CM

## 2020-01-17 DIAGNOSIS — R600 Localized edema: Secondary | ICD-10-CM

## 2020-01-17 DIAGNOSIS — M25662 Stiffness of left knee, not elsewhere classified: Secondary | ICD-10-CM

## 2020-01-17 DIAGNOSIS — R262 Difficulty in walking, not elsewhere classified: Secondary | ICD-10-CM

## 2020-01-17 NOTE — Therapy (Addendum)
[facility] Physical Therapy [address] [place], [place], [address] Phone: [phone]   Fax:  [phone]  Physical Therapy Treatment  Patient Details  Name: Brooke Lam MRN: [id number] Date of Birth: 08-18-1980 Referring Provider (PT): Eunice Blase MD  PHYSICAL THERAPY DISCHARGE SUMMARY  Visits from Start of Care: 3  Current functional level related to goals / functional outcomes: See note   Remaining deficits: See note   Education / Equipment: HEP Plan:                                                    Patient goals were not met. Patient is being discharged due to a change in medical status.  ?????     Encounter Date: 01/17/2020   PT End of Session - 01/17/20 1443    Visit Number 3    Number of Visits 8    Date for PT Re-Evaluation 02/10/20    Progress Note Due on Visit 10    PT Start Time 8242    PT Stop Time 1433    PT Time Calculation (min) 38 min    Activity Tolerance Patient tolerated treatment well    Behavior During Therapy WFL for tasks assessed/performed           Past Medical History:  Diagnosis Date  . Ectopic pregnancy   . Preterm labor     Past Surgical History:  Procedure Laterality Date  . ABDOMINAL HYSTERECTOMY    . BREAST BIOPSY Right 2013   FA  . CESAREAN SECTION     C/S x 4  . cesarian section      There were no vitals filed for this visit.   Subjective Assessment - 01/17/20 1441    Subjective Tally notes some progress in that her pain is no longer constant.  She can still get sharp pain with certain movements or with prolonged weight-bearing.    Patient is accompained by: Interpreter    Limitations Walking    How long can you stand comfortably? 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    How long can you walk comfortably? Depends.  Sometimes 3-4 miles, sometimes only a few steps.    Patient Stated Goals Be able to walk and work without L knee pain.    Currently in Pain?  No/denies    Pain Onset More than a month ago    Aggravating Factors  Work and certain movements.    Pain Relieving Factors Exercises    Effect of Pain on Daily Activities Limits walking for HBP and diabetes management.                             [place] Adult PT Treatment/Exercise - 01/17/20 0001      Therapeutic Activites    Therapeutic Activities Other Therapeutic Activities   Step up/over and step down 2 sets of 10 each 4 inch     Exercises   Exercises Knee/Hip      Knee/Hip Exercises: Machines for Strengthening   Cybex Knee Extension 10X 15# slow eccentrics R side helps as needed    Cybex Leg Press 100# 20X slow eccentrics      Knee/Hip Exercises: Seated   Long Arc Quad Strengthening;Both;4 sets;5 reps   slow eccentrics  Other Seated Knee/Hip Exercises Tailgate knee flexion AROM 3 minutes      Knee/Hip Exercises: Supine   Quad Sets Strengthening;Both;1 set;10 reps   5 seconds                 PT Education - 01/17/20 1442    Education Details Reviewed and progressed HEP.    Person(s) Educated Patient    Methods Explanation;Demonstration;Verbal cues    Comprehension Verbalized understanding;Returned demonstration;Need further instruction;Verbal cues required               PT Long Term Goals - 01/17/20 1443      PT LONG TERM GOAL #1   Title Magally will have an improved score on the FOTO functional assessment (website down as of evaluation completion, specific score will be reported on follow-up).    Baseline Obtained but unavailable due to website down.    Time 8    Period Weeks    Status On-going      PT LONG TERM GOAL #2   Title Josseline will report L knee pain consistently 0-2/10 on the Numeric Pain Rating Scale.    Baseline Can be 5/10    Time 8    Period Weeks    Status On-going      PT LONG TERM GOAL #3   Title Larry will have 135 degrees of L knee flexion at DC.    Baseline 124 degrees    Time 8    Period Weeks    Status  On-going      PT LONG TERM GOAL #4   Title Layza will have 5/5 MMT for L quadriceps at DC.    Baseline 4/5    Time 8    Period Weeks    Status On-going      PT LONG TERM GOAL #5   Title Karsyn will be independent with her long-term HEP at DC.    Time 8    Period Weeks    Status On-going                 Plan - 01/17/20 1444    Clinical Impression Statement Bernie reports compliance with her HEP.  She complains of L knee stiffness, although quadriceps weakness is most affecting function.  Continue current program to improve AROM, strength and self-reported function for return to previous level of function including FT work without pain and walking for medical management of diabetes and HBP.    Examination-Activity Limitations Squat;Stairs    Examination-Participation Restrictions Occupation;Community Activity    Stability/Clinical Decision Making Stable/Uncomplicated    Rehab Potential Good    PT Frequency Other (comment)   1X next week to review and correct HEP before follow-ups as needed and RA in 6-8 weeks.   PT Duration 8 weeks    PT Treatment/Interventions ADLs/Self Care Home Management;Cryotherapy;Therapeutic activities;Stair training;Therapeutic exercise;Neuromuscular re-education;Patient/family education    PT Next Visit Plan Continue strength program, dynamic stability as appopriate.    PT Home Exercise Plan Access Code: [email or web address]    Consulted and Agree with Plan of Care Patient           Patient will benefit from skilled therapeutic intervention in order to improve the following deficits and impairments:  Decreased activity tolerance, Decreased endurance, Decreased range of motion, Decreased strength, Increased edema, Pain  Visit Diagnosis: Chronic pain of left knee  Stiffness of left knee, not elsewhere classified  Localized edema  Difficulty walking     Problem List Patient Active Problem List  Diagnosis Date Noted  . Fibroadenoma of breast, right  05/11/2019  . Dyspareunia 11/12/2014  . S/P emergency cesarean hysterectomy 08/01/2012  . Dysuria 09/17/2011    Farley Ly PT, MPT 01/17/2020, 2:46 PM  [facility] Physical Therapy [address] [place], [place], [address] Phone: [phone]   Fax:  [phone]  Name: Darshay Deupree MRN: [id number] Date of Birth: 11-17-1980

## 2020-01-24 ENCOUNTER — Encounter: Payer: No Typology Code available for payment source | Admitting: Rehabilitative and Restorative Service Providers"

## 2020-01-31 ENCOUNTER — Telehealth: Payer: Self-pay | Admitting: Rehabilitative and Restorative Service Providers"

## 2020-01-31 ENCOUNTER — Encounter: Payer: No Typology Code available for payment source | Admitting: Rehabilitative and Restorative Service Providers"

## 2020-01-31 NOTE — Telephone Encounter (Signed)
Called and spoke to Pt. Regarding today's missed visit.  Pt. Indicated forgetting.  Confirmed desire to attend next visit Nov 17th at [place], PT, DPT, OCS, ATC 01/31/20  1:16 PM

## 2020-02-07 ENCOUNTER — Encounter: Payer: No Typology Code available for payment source | Admitting: Rehabilitative and Restorative Service Providers"

## 2020-02-19 ENCOUNTER — Encounter: Payer: Self-pay | Admitting: Gastroenterology

## 2020-02-21 ENCOUNTER — Other Ambulatory Visit: Payer: Self-pay

## 2020-02-21 ENCOUNTER — Ambulatory Visit (HOSPITAL_COMMUNITY)
Admission: EM | Admit: 2020-02-21 | Discharge: 2020-02-21 | Disposition: A | Discharge status: Home / Self Care | Payer: Self-pay | Attending: Emergency Medicine | Admitting: Emergency Medicine

## 2020-02-21 ENCOUNTER — Encounter (HOSPITAL_COMMUNITY): Payer: Self-pay | Admitting: *Deleted

## 2020-02-21 DIAGNOSIS — N898 Other specified noninflammatory disorders of vagina: Secondary | ICD-10-CM | POA: Insufficient documentation

## 2020-02-21 LAB — POCT URINALYSIS DIPSTICK, ED / UC
Bilirubin Urine: NEGATIVE
Glucose, UA: NEGATIVE mg/dL
Ketones, ur: NEGATIVE mg/dL
Nitrite: NEGATIVE
Protein, ur: NEGATIVE mg/dL
Specific Gravity, Urine: >=1.03 (ref 1.005–1.030)
Urobilinogen, UA: 0.2 mg/dL (ref 0.0–1.0)
pH: 5.5 (ref 5.0–8.0)

## 2020-02-21 LAB — CERVICOVAGINAL ANCILLARY ONLY
Bacterial Vaginitis (gardnerella): NEGATIVE
Candida Glabrata: NEGATIVE
Candida Vaginitis: NEGATIVE
Chlamydia: NEGATIVE
Comment: NEGATIVE
Comment: NEGATIVE
Comment: NEGATIVE
Comment: NEGATIVE
Comment: NEGATIVE
Comment: NORMAL
Neisseria Gonorrhea: NEGATIVE
Trichomonas: NEGATIVE

## 2020-02-21 MED ORDER — DIPHENHYDRAMINE HCL 25 MG PO TABS: 25.0000 mg | ORAL_TABLET | Freq: Three times a day (TID) | ORAL | 0 refills | Status: DC | PRN

## 2020-02-21 NOTE — Discharge Instructions (Signed)
Please wait for testing for yeast and bacterial vaginosis. In the meantime, you can take 25 mg of Benadryl every 8 hours for vaginal itching

## 2020-02-21 NOTE — ED Provider Notes (Signed)
____________________________________________  Time seen: Approximately 8:26 AM  I have reviewed the triage vital signs and the nursing notes.   HISTORY  Chief Complaint Vaginal Itching   Historian Patient    HPI Brooke Lam is a 39 y.o. female presents to the urgent care with vaginal itching for the past 3 days.  Patient has a history of BV in the past.  She denies recent antibiotic usage.  No changes in vaginal discharge.  She denies dysuria, hematuria or increased urinary frequency.  No other alleviating measures have been attempted.   Past Medical History:  Diagnosis Date   Ectopic pregnancy    Preterm labor      Immunizations up to date:  Yes.     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   Dyspareunia 11/12/2014   S/P emergency cesarean hysterectomy 08/01/2012   Dysuria 09/17/2011    Past Surgical History:  Procedure Laterality Date   ABDOMINAL HYSTERECTOMY     BREAST BIOPSY Right 2013   FA   CESAREAN SECTION     C/S x 4   cesarian section      Prior to Admission medications   Medication Sig Start Date End Date Taking? Authorizing Provider  metroNIDAZOLE (FLAGYL) 500 MG tablet Take 1 tablet (500 mg total) by mouth 2 (two) times daily. 09/13/19   Lamptey, Myrene Galas, MD    Allergies Patient has no known allergies.  Family History  Problem Relation Age of Onset   Diabetes Father    Diabetes Paternal Grandmother    Anesthesia problems Neg Hx    Hearing loss Neg Hx    Other Neg Hx     Social History Social History   Tobacco Use   Smoking status: Former Smoker    Types: Cigarettes   Smokeless tobacco: Never Used   Tobacco comment: quit 2005  Substance Use Topics   Alcohol use: No    Alcohol/week: 0.0 standard drinks   Drug use: No     Review of Systems   Constitutional: No fever/chills Eyes:  No discharge ENT: No upper respiratory complaints. Respiratory: no cough. No SOB/ use of accessory muscles to breath Gastrointestinal:   No nausea, no vomiting.  No diarrhea.  No constipation. Genitourinary: Patient has vaginal itching.  Musculoskeletal: Negative for musculoskeletal pain. Skin: Negative for rash, abrasions, lacerations, ecchymosis.    ____________________________________________   PHYSICAL EXAM:  VITAL SIGNS: ED Triage Vitals  Enc Vitals Group     BP 02/21/20 0819 (!) 131/91     Pulse Rate 02/21/20 0819 72     Resp 02/21/20 0819 18     Temp 02/21/20 0819 98.2 F (36.8 C)     Temp Source 02/21/20 0819 Oral     SpO2 02/21/20 0819 99 %     Weight 02/21/20 0820 168 lb (76.2 kg)     Height 02/21/20 0820 5\' 3"  (1.6 m)     Head Circumference --      Peak Flow --      Pain Score 02/21/20 0820 0     Pain Loc --      Pain Edu? --      Excl. in [place]? --      Constitutional: Alert and oriented. Well appearing and in no acute distress. Eyes: Conjunctivae are normal. PERRL. EOMI. Head: Atraumatic.  Cardiovascular:  Normal rate, regular rhythm. Normal S1 and S2.  Good peripheral circulation. Respiratory: Normal respiratory effort without tachypnea or retractions. Lungs CTAB. Good air entry to the bases with no decreased or absent breath sounds Gastrointestinal: Bowel sounds x 4 quadrants. Soft and nontender to palpation. No guarding or rigidity. No distention. Skin:  Skin is warm, dry and intact. No rash noted. Psychiatric: Mood and affect are normal for age. Speech and behavior are normal.   ____________________________________________   LABS (all labs ordered are listed, but only abnormal results are displayed)  Labs Reviewed  POCT URINALYSIS DIPSTICK, ED / UC  CERVICOVAGINAL ANCILLARY ONLY   ____________________________________________  EKG   ____________________________________________  RADIOLOGY  No results  found.  ____________________________________________    PROCEDURES  Procedure(s) performed:     Procedures     Medications - No data to display   ____________________________________________   INITIAL IMPRESSION / ASSESSMENT AND PLAN / ED COURSE  Pertinent labs & imaging results that were available during my care of the patient were reviewed by me and considered in my medical decision making (see chart for details).      Assessment and plan Vaginal itching 39 year old female presents to the urgent care with vaginal itching for the past 3 days.  Vital signs are reassuring in triage.  On physical exam, patient was alert, active and nontoxic-appearing with no CVA or suprapubic pain.  Differential diagnosis includes BV, yeast vaginitis, UTI, atrophic vaginitis...  Urinalysis is not consistent with a UTI.  Testing for BV, yeast, trichomoniasis and gonorrhea/chlamydia are in process.  No signs of atrophic vaginitis on exam.  We will hold off on treatment for now until testing results return.  I prescribed patient Benadryl every 8 hours for the next 3 days for vaginal pruritus.   ____________________________________________  FINAL CLINICAL IMPRESSION(S) / ED DIAGNOSES  Final diagnoses:  Vaginal itching      NEW MEDICATIONS STARTED DURING THIS VISIT:  ED Discharge Orders    None          This chart was dictated using voice recognition software/Dragon. Despite best efforts to proofread, errors can occur which can change the meaning. Any change was purely unintentional.     Lannie Fields, PA-C 02/21/20 0900

## 2020-02-21 NOTE — ED Triage Notes (Signed)
Pt reports external vag area id itching for 3 days. Pt denies any vag DC.

## 2020-02-22 LAB — URINE CULTURE

## 2020-04-22 ENCOUNTER — Encounter: Payer: Self-pay | Admitting: Gastroenterology

## 2020-04-22 ENCOUNTER — Other Ambulatory Visit (INDEPENDENT_AMBULATORY_CARE_PROVIDER_SITE_OTHER): Payer: No Typology Code available for payment source

## 2020-04-22 ENCOUNTER — Other Ambulatory Visit: Payer: Self-pay

## 2020-04-22 ENCOUNTER — Ambulatory Visit: Payer: Self-pay | Admitting: Gastroenterology

## 2020-04-22 VITALS — BP 108/76 | HR 88 | Ht 62.25 in | Wt 169.5 lb

## 2020-04-22 DIAGNOSIS — R140 Abdominal distension (gaseous): Secondary | ICD-10-CM

## 2020-04-22 DIAGNOSIS — R1012 Left upper quadrant pain: Secondary | ICD-10-CM

## 2020-04-22 DIAGNOSIS — R110 Nausea: Secondary | ICD-10-CM

## 2020-04-22 LAB — HEPATIC FUNCTION PANEL
ALT: 9 U/L (ref 0–35)
AST: 18 U/L (ref 0–37)
Albumin: 4.5 g/dL (ref 3.5–5.2)
Alkaline Phosphatase: 56 U/L (ref 39–117)
Bilirubin, Direct: 0 mg/dL (ref 0.0–0.3)
Total Bilirubin: 0.5 mg/dL (ref 0.2–1.2)
Total Protein: 7.7 g/dL (ref 6.0–8.3)

## 2020-04-22 LAB — LIPASE: Lipase: 46 U/L (ref 11.0–59.0)

## 2020-04-22 MED ORDER — PANTOPRAZOLE SODIUM 40 MG PO TBEC: 40.0000 mg | DELAYED_RELEASE_TABLET | Freq: Every morning | ORAL | 2 refills | Status: DC

## 2020-04-22 NOTE — Patient Instructions (Addendum)
RECOMMENDATION(S):   I have recommended that you have labs completed today.  LABS:   Your provider has requested that you go to the basement level for lab work before leaving today. Press "B" on the elevator. The lab is located at the first door on the left as you exit the elevator.  HEALTHCARE LAWS AND MY CHART RESULTS: Due to recent changes in healthcare laws, you may see the results of your imaging and laboratory studies on MyChart before your provider has had a chance to review them.  We understand that in some cases there may be results that are confusing or concerning to you. Not all laboratory results come back in the same time frame and the provider may be waiting for multiple results in order to interpret others.  Please give [place] 48 hours in order for your provider to thoroughly review all the results before contacting the office for clarification of your results.   ABDOMINAL ULTRASOUND:  You have been scheduled for an abdominal ultrasound at [facility] (1st floor of the hospital) on 04/29/20 at 10am. Please arrive @ 9:45am for registration.   PREP:   Please DO NOT eat or drink anything after midnight the night before your test.  NEED TO RESCHEDULE?   Please call radiology at [phone].  WHY ARE YOU HAVING THIS EXAM? Further evaluation of your symptoms  ENDOSCOPY:   You have been scheduled for an endoscopy. Please follow written instructions given to you at your visit today.  INHALERS:   If you use inhalers (even only as needed), please bring them with you on the day of your procedure.  PRESCRIPTION MEDICATION(S): In the meantime, I recommend a trial of pantoprazole 40 mg taken every morning. We have sent the following medication(s) to your pharmacy:  . Pantoprazole - please take 40mg  by mouth every morning  . NOTE: If your medication(s) requires a PRIOR AUTHORIZATION, we will receive notification from your pharmacy. Once received, the process to submit for  approval may take up to 7-10 business days. You will be contacted about any denials we have received from your insurance company as well as alternatives recommended by your provider.  BMI:  If you are age 23 or younger, your body mass index should be between 19-25. Your Body mass index is 30.75 kg/m. If this is out of the aformentioned range listed, please consider follow up with your Primary Care Provider.   Thank you for trusting me with your gastrointestinal care!    Thornton Park, MD, MPH

## 2020-04-22 NOTE — Progress Notes (Addendum)
Referring Provider: Antony Blackbird, MD Primary Care Physician:  Antony Blackbird, MD (Inactive)  Reason for Consultation:  Abdominal pain   IMPRESSION:  LUQ pain with associated nausea and bloating No alarm features.    Differential for GI causes of LUQ abdominal includes esophagitis, non erosive reflux disease, peptic ulcer disease, H pylori, splenomegaly, constipation and pancreatitis.  LUQ pain is a rare of possible manifestations of cardiac disease. Must also consider gynecologic etiologies including ovarian cancer.     PLAN: Hepatic function panel, lipase Pantoprazole 40 mg QAM Abdominal Ultrasound +/- HIDA with CCK if negative EGD with biopsies GYN evaluation if GI evaluation is negative  Please see the "Patient Instructions" section for addition details about the plan.  HPI: Brooke Lam is a 40 y.o. female self-referred for evaluation of abdominal pain. The history is obtained through the patient with the assistance of a spanish interpreter. 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  One year history of intermittent 5/10 LUQ pain with associated gas, nausea and bloating. Triggered by fatty and greasy foods. No change with position or defecation. No other identified exacerbating or relieving features. No alarm features.   Seen in the ED last year and told it was probably acid reflux.  Some constipation that is diet related. She thinks that this is a results of her disrupted eating habits from working the 3rd shift as a Chief Operating Officer.   Drinks whiskey on the weekend, but notes no change in symptoms or frequency of symptoms on the weekends.   Labs 06/06/19: normal liver enzymes Labs 09/20/19: WBC 7.5, hgb 11.9, MCV 86.2, platelets 252  Father with constipation. No known family history of colon cancer or polyps. No family history of uterine/endometrial cancer, pancreatic cancer or gastric/stomach cancer.   Past Medical History:  Diagnosis Date  . Ectopic pregnancy   .  Preterm labor     Past Surgical History:  Procedure Laterality Date  . ABDOMINAL HYSTERECTOMY    . BREAST BIOPSY Right 2013   FA  . CESAREAN SECTION     C/S x 4    Current Outpatient Medications  Medication Sig Dispense Refill  . pantoprazole (PROTONIX) 40 MG tablet Take 1 tablet (40 mg total) by mouth in the morning. 30 tablet 2   No current facility-administered medications for this visit.    Allergies as of 04/22/2020  . (No Known Allergies)    Family History  Problem Relation Age of Onset  . Diabetes Father   . Hypertension Father   . Diabetes Paternal Grandmother   . Diabetes Mother   . Hypertension Mother   . Diabetes Paternal Aunt   . Anesthesia problems Neg Hx   . Hearing loss Neg Hx   . Other Neg Hx     Social History   Socioeconomic History  . Marital status: Legally Separated    Spouse name: Not on file  . Number of children: 4  . Years of education: Not on file  . Highest education level: 11th grade  Occupational History  . Occupation: bartender  Tobacco Use  . Smoking status: Former Smoker    Types: Cigarettes    Quit date: 2005    Years since quitting: 17.0  . Smokeless tobacco: Never Used  Vaping Use  . Vaping Use: Never used  Substance and Sexual Activity  . Alcohol use: Yes    Alcohol/week: 0.0 standard drinks    Comment: every 8 days 5-8 drinks of wine and whiskey  . Drug use: No  .  Sexual activity: Yes    Birth control/protection: None, Surgical  Other Topics Concern  . Not on file  Social History Narrative  . Not on file   Social Determinants of Health   Financial Resource Strain: Not on file  Food Insecurity: Not on file  Transportation Needs: No Transportation Needs  . Lack of Transportation (Medical): No  . Lack of Transportation (Non-Medical): No  Physical Activity: Not on file  Stress: Not on file  Social Connections: Not on file  Intimate Partner Violence: Not on file    Review of Systems: 12 system ROS is  negative except as noted above.   Physical Exam: General:   Alert,  well-nourished, pleasant and cooperative in NAD Head:  Normocephalic and atraumatic. Eyes:  Sclera clear, no icterus.   Conjunctiva pink. Ears:  Normal auditory acuity. Nose:  No deformity, discharge,  or lesions. Mouth:  No deformity or lesions.   Neck:  Supple; no masses or thyromegaly. Lungs:  Clear throughout to auscultation.   No wheezes. Heart:  Regular rate and rhythm; no murmurs. Abdomen:  Soft ,nontender, nondistended, normal bowel sounds, no rebound or guarding. No hepatosplenomegaly.  I am unable to reproduce her abdominal pain on exam.  Rectal:  Deferred  Msk:  Symmetrical. No boney deformities LAD: No inguinal or umbilical LAD Extremities:  No clubbing or edema. Neurologic:  Alert and  oriented x4;  grossly nonfocal Skin:  Intact without significant lesions or rashes. Psych:  Alert and cooperative. Normal mood and affect.   Kimberly L. Tarri Glenn, MD, MPH 04/22/2020, 12:31 PM

## 2020-04-23 ENCOUNTER — Other Ambulatory Visit: Payer: Self-pay

## 2020-04-23 ENCOUNTER — Encounter: Payer: Self-pay | Admitting: Gastroenterology

## 2020-04-23 ENCOUNTER — Ambulatory Visit (AMBULATORY_SURGERY_CENTER): Payer: Self-pay | Admitting: Gastroenterology

## 2020-04-23 VITALS — BP 104/68 | HR 74 | Temp 97.0°F | Resp 14 | Ht 62.0 in | Wt 169.0 lb

## 2020-04-23 DIAGNOSIS — R1012 Left upper quadrant pain: Secondary | ICD-10-CM

## 2020-04-23 DIAGNOSIS — K2980 Duodenitis without bleeding: Secondary | ICD-10-CM

## 2020-04-23 DIAGNOSIS — K2950 Unspecified chronic gastritis without bleeding: Secondary | ICD-10-CM

## 2020-04-23 DIAGNOSIS — K3189 Other diseases of stomach and duodenum: Secondary | ICD-10-CM

## 2020-04-23 DIAGNOSIS — R140 Abdominal distension (gaseous): Secondary | ICD-10-CM

## 2020-04-23 DIAGNOSIS — R110 Nausea: Secondary | ICD-10-CM

## 2020-04-23 DIAGNOSIS — K319 Disease of stomach and duodenum, unspecified: Secondary | ICD-10-CM

## 2020-04-23 HISTORY — PX: UPPER GASTROINTESTINAL ENDOSCOPY: SHX188

## 2020-04-23 MED ORDER — SODIUM CHLORIDE 0.9 % IV SOLN: 500.0000 mL | Freq: Once | INTRAVENOUS | Status: DC

## 2020-04-23 NOTE — Patient Instructions (Signed)
Thank you for allowing [place] to care for you today!  Await biopsy results , approximately 7-10 days.  Will make recommendations at that time  Continue to take Pantoprazole 40 mg by mouth every morning.    USTED TUVO UN PROCEDIMIENTO ENDOSCPICO HOY EN EL [facility]:   Lea el informe del procedimiento que se le entreg para cualquier pregunta especfica sobre lo que se Primary school teacher.  Si el informe del examen no responde a sus preguntas, por favor llame a su gastroenterlogo para aclararlo.  Si usted solicit que no se le den [employer] de lo que se Estate manager/land agent en su procedimiento al [employer] va a cuidar, entonces el informe del procedimiento se ha incluido en un sobre sellado para que usted lo revise despus cuando le sea ms conveniente.   LO QUE PUEDE ESPERAR: Algunas sensaciones de hinchazn en el abdomen.  Puede tener ms gases de lo normal.  El caminar puede ayudarle a eliminar el aire que se le puso en el tracto gastrointestinal durante el procedimiento y reducir la hinchazn.  Si le hicieron una endoscopia inferior (como una colonoscopia o una sigmoidoscopia flexible), podra notar manchas de sangre en las heces fecales o en el papel higinico.  Si se someti a una preparacin intestinal para su procedimiento, es posible que no tenga una evacuacin intestinal normal durante [employer].   Tenga en cuenta:  Es posible que note un poco de irritacin y congestin en la nariz o algn drenaje.  Esto es debido al oxgeno [employer] durante su procedimiento.  No hay que preocuparse y esto debe desaparecer ms o Scientist, research (medical).   SNTOMAS PARA REPORTAR INMEDIATAMENTE:  Despus de una endoscopia inferior (colonoscopia o sigmoidoscopia flexible):  Cantidades excesivas de sangre en las heces fecales  Sensibilidad significativa o empeoramiento de los dolores abdominales   Hinchazn aguda del abdomen que antes no tena   Fiebre de 100F o ms      Para asuntos urgentes  o de Freight forwarder, puede comunicarse con un gastroenterlogo a cualquier hora llamando al [phone].  DIETA:  Recomendamos una comida pequea al principio, pero luego puede continuar con su dieta normal.  Tome muchos lquidos, Teacher, adult education las bebidas alcohlicas durante 24 horas.    ACTIVIDAD:  Debe planear tomarse las cosas con calma por el resto del da y no debe CONDUCIR ni usar maquinaria pesada Programmer, applications (debido a los medicamentos de sedacin utilizados durante el examen).     SEGUIMIENTO: Nuestro personal llamar al nmero que aparece en su historial al siguiente da hbil de su procedimiento para ver cmo se siente y para responder cualquier pregunta o inquietud que pueda tener con respecto a la informacin que se le dio despus del procedimiento. Si no podemos contactarle, le dejaremos un mensaje.  Sin embargo, si se siente bien y no tiene Paediatric nurse, no es necesario que nos devuelva la llamada.  Asumiremos que ha regresado a sus actividades diarias normales sin incidentes. Si se le tomaron algunas biopsias, le contactaremos por telfono o por carta en las prximas 3 semanas.  Si no ha sabido [employer] biopsias en el transcurso de 3 semanas, por favor llmenos al [phone].   FIRMAS/CONFIDENCIALIDAD: Usted y/o el acompaante que le cuide han firmado documentos que se ingresarn en su historial mdico Emergency planning/management officer.  Estas firmas atestiguan el hecho de que la informacin anterior USTED TUVO UN PROCEDIMIENTO ENDOSCPICO HOY EN EL [facility]:   Ivin Booty  informe del procedimiento que se le entreg para cualquier pregunta especfica sobre lo que se Primary school teacher.  Si el informe del examen no responde a sus preguntas, por favor llame a su gastroenterlogo para aclararlo.  Si usted solicit que no se le den [employer] de lo que se Estate manager/land agent en su procedimiento al [employer] va a cuidar, entonces el informe del procedimiento se ha incluido en un sobre  sellado para que usted lo revise despus cuando le sea ms conveniente.   LO QUE PUEDE ESPERAR: Algunas sensaciones de hinchazn en el abdomen.  Puede tener ms gases de lo normal.  El caminar puede ayudarle a eliminar el aire que se le puso en el tracto gastrointestinal durante el procedimiento y reducir la hinchazn.  Si le hicieron una endoscopia inferior (como una colonoscopia o una sigmoidoscopia flexible), podra notar manchas de sangre en las heces fecales o en el papel higinico.  Si se someti a una preparacin intestinal para su procedimiento, es posible que no tenga una evacuacin intestinal normal durante [employer].   Tenga en cuenta:  Es posible que note un poco de irritacin y congestin en la nariz o algn drenaje.  Esto es debido al oxgeno [employer] durante su procedimiento.  No hay que preocuparse y esto debe desaparecer ms o Scientist, research (medical).   SNTOMAS PARA REPORTAR INMEDIATAMENTE:  Despus de una endoscopia inferior (colonoscopia o sigmoidoscopia flexible):  Cantidades excesivas de sangre en las heces fecales  Sensibilidad significativa o empeoramiento de los dolores abdominales   Hinchazn aguda del abdomen que antes no tena   Fiebre de 100F o ms   Despus de la endoscopia superior (EGD)  Vmitos de Biochemist, clinical o material como caf molido   Dolor en el pecho o dolor debajo de los omplatos que antes no tena   Dolor o dificultad persistente para tragar  Falta de aire que antes no tena   Fiebre de 100F o ms  Heces fecales negras y pegajosas   Para asuntos urgentes o de Freight forwarder, puede comunicarse con un gastroenterlogo a cualquier hora llamando al [phone].  DIETA:  Recomendamos una comida pequea al principio, pero luego puede continuar con su dieta normal.  Tome muchos lquidos, Teacher, adult education las bebidas alcohlicas durante 24 horas.    ACTIVIDAD:  Debe planear tomarse las cosas con calma por el resto del da y no debe CONDUCIR ni usar maquinaria pesada  Programmer, applications (debido a los medicamentos de sedacin utilizados durante el examen).     SEGUIMIENTO: Nuestro personal llamar al nmero que aparece en su historial al siguiente da hbil de su procedimiento para ver cmo se siente y para responder cualquier pregunta o inquietud que pueda tener con respecto a la informacin que se le dio despus del procedimiento. Si no podemos contactarle, le dejaremos un mensaje.  Sin embargo, si se siente bien y no tiene Paediatric nurse, no es necesario que nos devuelva la llamada.  Asumiremos que ha regresado a sus actividades diarias normales sin incidentes. Si se le tomaron algunas biopsias, le contactaremos por telfono o por carta en las prximas 3 semanas.  Si no ha sabido [employer] biopsias en el transcurso de 3 semanas, por favor llmenos al [phone].   FIRMAS/CONFIDENCIALIDAD: Usted y/o el acompaante que le cuide han firmado documentos que se ingresarn en su historial mdico electrnico.  Estas firmas atestiguan el hecho de que la informacin anterior

## 2020-04-23 NOTE — Op Note (Signed)
[facility] Patient Name: Brooke Lam Procedure Date: 04/23/2020 3:57 PM MRN: [id number] Endoscopist: Thornton Park MD, MD Age: 40 Referring MD:  Date of Birth: 1980/05/04 Gender: Female Account #: [id number] Procedure:                Upper GI endoscopy Indications:              Abdominal pain in the left upper quadrant,                            Abdominal bloating, Nausea Medicines:                Monitored Anesthesia Care Procedure:                Pre-Anesthesia Assessment:                           - Prior to the procedure, a History and Physical                            was performed, and patient medications and                            allergies were reviewed. The patient's tolerance of                            previous anesthesia was also reviewed. The risks                            and benefits of the procedure and the sedation                            options and risks were discussed with the patient.                            All questions were answered, and informed consent                            was obtained. Prior Anticoagulants: The patient has                            taken no previous anticoagulant or antiplatelet                            agents. ASA Grade Assessment: II - A patient with                            mild systemic disease. After reviewing the risks                            and benefits, the patient was deemed in                            satisfactory condition to undergo the procedure.  After obtaining informed consent, the endoscope was                            passed under direct vision. Throughout the                            procedure, the patient's blood pressure, pulse, and                            oxygen saturations were monitored continuously. The                            Endoscope was introduced through the mouth, and                            advanced to the third  part of duodenum. The upper                            GI endoscopy was accomplished without difficulty.                            The patient tolerated the procedure well. Scope In: Scope Out: Findings:                 The examined esophagus was normal.                           Diffuse mild mucosal changes characterized by an                            increased vascular pattern were found in the entire                            examined stomach. Biopsies were taken from the                            antrum, body, and fundus with a cold forceps for                            histology. Estimated blood loss was minimal.                           The examined duodenum was normal. Biopsies were                            taken with a cold forceps for histology. Estimated                            blood loss was minimal.                           The cardia and gastric fundus were normal on  retroflexion.                           The exam was otherwise without abnormality. Complications:            No immediate complications. Estimated blood loss:                            Minimal. Estimated Blood Loss:     Estimated blood loss was minimal. Impression:               - Normal esophagus.                           - Increased vascular pattern mucosa in the stomach.                            Biopsied.                           - Normal examined duodenum. Biopsied.                           - The examination was otherwise normal. Recommendation:           - Patient has a contact number available for                            emergencies. The signs and symptoms of potential                            delayed complications were discussed with the                            patient. Return to normal activities tomorrow.                            Written discharge instructions were provided to the                            patient.                           -  Resume previous diet.                           - Continue present medications including                            pantoprazole 40 mg QAM.                           - Await pathology results.                           - Proceed with ultrasound +/- HIDA if biopsies are  normal. Thornton Park MD, MD 04/23/2020 4:14:03 PM This report has been signed electronically.

## 2020-04-23 NOTE — Progress Notes (Signed)
Called to room to assist during endoscopic procedure.  Patient ID and intended procedure confirmed with present staff. Received instructions for my participation in the procedure from the performing physician.  

## 2020-04-23 NOTE — Progress Notes (Signed)
Report to PACU, RN, vss, BBS= Clear.  

## 2020-04-23 NOTE — Progress Notes (Signed)
Pt's states no medical or surgical changes since previsit or office visit.  ° °Vitals CW °

## 2020-04-23 NOTE — Progress Notes (Signed)
as

## 2020-04-25 ENCOUNTER — Other Ambulatory Visit: Payer: Self-pay | Admitting: *Deleted

## 2020-04-25 ENCOUNTER — Telehealth: Payer: Self-pay | Admitting: *Deleted

## 2020-04-25 DIAGNOSIS — R5381 Other malaise: Secondary | ICD-10-CM

## 2020-04-25 NOTE — Telephone Encounter (Signed)
  Follow up Call-  Call back number 04/23/2020  Post procedure Call Back phone  # [phone]- daughter Joanne Chars  Permission to leave phone message Yes  Some recent data might be hidden     Patient questions:  Do you have a fever, pain , or abdominal swelling? No. Pain Score  0 *  Have you tolerated food without any problems? Yes.    Have you been able to return to your normal activities? Yes.    Do you have any questions about your discharge instructions: Diet   No. Medications  No. Follow up visit  No.  Do you have questions or concerns about your Care? No.  Actions: * If pain score is 4 or above: No action needed, pain <4.  1. Have you developed a fever since your procedure? no  2.   Have you had an respiratory symptoms (SOB or cough) since your procedure? no  3.   Have you tested positive for COVID 19 since your procedure no  4.   Have you had any family members/close contacts diagnosed with the COVID 19 since your procedure?  no   If yes to any of these questions please route to Joylene John, RN and Joella Prince, RN

## 2020-04-29 ENCOUNTER — Other Ambulatory Visit: Payer: Self-pay

## 2020-04-29 ENCOUNTER — Ambulatory Visit (HOSPITAL_COMMUNITY)
Admission: RE | Admit: 2020-04-29 | Discharge: 2020-04-29 | Disposition: A | Discharge status: Home / Self Care | Payer: Self-pay | Source: Ambulatory Visit | Attending: Gastroenterology | Admitting: Gastroenterology

## 2020-04-29 DIAGNOSIS — R1012 Left upper quadrant pain: Secondary | ICD-10-CM | POA: Insufficient documentation

## 2020-04-29 DIAGNOSIS — R110 Nausea: Secondary | ICD-10-CM | POA: Insufficient documentation

## 2020-04-29 DIAGNOSIS — R140 Abdominal distension (gaseous): Secondary | ICD-10-CM | POA: Insufficient documentation

## 2020-05-03 ENCOUNTER — Other Ambulatory Visit: Payer: Self-pay | Admitting: Obstetrics and Gynecology

## 2020-05-03 DIAGNOSIS — Z1231 Encounter for screening mammogram for malignant neoplasm of breast: Secondary | ICD-10-CM

## 2020-05-13 ENCOUNTER — Other Ambulatory Visit: Payer: Self-pay

## 2020-05-13 DIAGNOSIS — R110 Nausea: Secondary | ICD-10-CM

## 2020-05-14 ENCOUNTER — Ambulatory Visit (HOSPITAL_COMMUNITY)
Admission: RE | Admit: 2020-05-14 | Discharge: 2020-05-14 | Disposition: A | Discharge status: Home / Self Care | Payer: Self-pay | Source: Ambulatory Visit | Attending: Gastroenterology | Admitting: Gastroenterology

## 2020-05-14 ENCOUNTER — Other Ambulatory Visit: Payer: Self-pay

## 2020-05-14 DIAGNOSIS — R1012 Left upper quadrant pain: Secondary | ICD-10-CM | POA: Insufficient documentation

## 2020-05-14 MED ORDER — TECHNETIUM TC 99M MEBROFENIN IV KIT
5.2000 | PACK | Freq: Once | INTRAVENOUS | Status: AC
  Administered 2020-05-14: 5.2 via INTRAVENOUS

## 2020-05-15 ENCOUNTER — Other Ambulatory Visit: Payer: No Typology Code available for payment source

## 2020-05-15 DIAGNOSIS — R110 Nausea: Secondary | ICD-10-CM

## 2020-05-16 ENCOUNTER — Other Ambulatory Visit: Payer: Self-pay

## 2020-05-16 ENCOUNTER — Ambulatory Visit: Payer: Self-pay | Admitting: *Deleted

## 2020-05-16 ENCOUNTER — Ambulatory Visit
Admission: RE | Admit: 2020-05-16 | Discharge: 2020-05-16 | Disposition: A | Discharge status: Home / Self Care | Payer: No Typology Code available for payment source | Source: Ambulatory Visit | Attending: Obstetrics and Gynecology | Admitting: Obstetrics and Gynecology

## 2020-05-16 VITALS — BP 132/80 | Wt 167.9 lb

## 2020-05-16 DIAGNOSIS — Z1231 Encounter for screening mammogram for malignant neoplasm of breast: Secondary | ICD-10-CM

## 2020-05-16 DIAGNOSIS — R1012 Left upper quadrant pain: Secondary | ICD-10-CM

## 2020-05-16 DIAGNOSIS — Z01419 Encounter for gynecological examination (general) (routine) without abnormal findings: Secondary | ICD-10-CM

## 2020-05-16 DIAGNOSIS — Z124 Encounter for screening for malignant neoplasm of cervix: Secondary | ICD-10-CM

## 2020-05-16 LAB — TISSUE TRANSGLUTAMINASE ABS,IGG,IGA
(tTG) Ab, IgA: <1 U/mL
(tTG) Ab, IgG: <1 U/mL

## 2020-05-16 NOTE — Progress Notes (Signed)
Ms. Ahniyah Giancola is a 40 y.o. [id number] female who presents to [facility] clinic today with no complaints.    Pap Smear: Pap smear completed today. Last Pap smear was 10/17/2014 at [facility] for [place] [facility] and was normal. Per patient has no history of an abnormal Pap smear. 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 Last Pap smear result is available in Epic.   Physical exam: Breasts Right breast slightly larger than left breast that per patient is normal for her. No skin abnormalities bilateral breasts. No nipple retraction bilateral breasts. No nipple discharge bilateral breasts. No lymphadenopathy. No lumps palpated left breast. Palpated a lump right breast at 3 o'clock 2 cm from the nipple that is consistent with the same lump palpated at previous exam 05/09/2019. 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 No complaints of pain or tenderness on exam.     Pelvic/Bimanual Ext Genitalia No lesions, no swelling and no discharge observed on external genitalia.        Vagina Vagina pink and normal texture. No lesions or discharge observed in vagina.        Cervix Cervix is present. Cervix pink and of normal texture. No discharge observed.    Uterus Uterus is absent due to history of supra-hysterectomy for benign reasons.        Adnexae Bilateral ovaries present and palpable. No tenderness on palpation.         Rectovaginal No rectal exam completed today since patient had no rectal complaints. No skin abnormalities observed on exam.     Smoking History: Patient is a former smoker that quit in 2005.   Patient Navigation: Patient education provided. Access to services provided for patient through [place] program. Spanish interpreter Rudene Anda from [facility] provided.   Breast and Cervical  Cancer Risk Assessment: Patient does not have family history of breast cancer, known genetic mutations, or radiation treatment to the chest before age 89. Patient does not have history of cervical dysplasia, immunocompromised, or DES exposure in-utero.  Risk Assessment    Risk Scores      05/16/2020 05/09/2019   Last edited by: Demetrius Revel, LPN McGill, Sherie Mamie Nick, LPN   5-year risk: 0.3 % 0.2 %   Lifetime risk: 4.7 % 4.7 %          A: [facility] exam with pap smear No complaints.  P: Referred patient to the [facility] for a screening mammogram on the mobile unit per recommendation. Appointment scheduled Thursday, May 16, 2020 at 1340.  Loletta Parish, RN 05/16/2020 1:04 PM

## 2020-05-16 NOTE — Patient Instructions (Signed)
Explained breast self awareness with Cindra Presume. Pap smear completed today. Let her know [facility] will cover Pap smears and HPV typing every 5 years unless has a history of abnormal Pap smears. Referred patient to the [facility] for a screening mammogram on the mobile unit per recommendation. Appointment scheduled Thursday, May 16, 2020 at 1340. Patient escorted to the mobile unit following [facility] appointment for her screening mammogram. Let patient know will follow up with her within the next couple weeks with results of her Pap smear by phone. Informed patient that the [facility] will follow-up with her within the next couple of weeks with results of her mammogram by letter or phone. Dhruvi Crenshaw verbalized understanding.  Bhumi Godbey, Arvil Chaco, RN 1:06 PM

## 2020-05-17 LAB — CYTOLOGY - PAP
Comment: NEGATIVE
Diagnosis: NEGATIVE
High risk HPV: NEGATIVE

## 2020-05-21 ENCOUNTER — Telehealth: Payer: Self-pay

## 2020-05-21 ENCOUNTER — Other Ambulatory Visit: Payer: No Typology Code available for payment source

## 2020-05-21 DIAGNOSIS — R1012 Left upper quadrant pain: Secondary | ICD-10-CM

## 2020-05-21 NOTE — Telephone Encounter (Signed)
Via Rudene Anda, [place] Interpreter, Patient informed Pap/HPV negative, next pap smear due in 5 years. Patient verbalized understanding.

## 2020-05-22 ENCOUNTER — Other Ambulatory Visit: Payer: Self-pay

## 2020-05-23 ENCOUNTER — Other Ambulatory Visit: Payer: Self-pay

## 2020-05-23 DIAGNOSIS — R768 Other specified abnormal immunological findings in serum: Secondary | ICD-10-CM

## 2020-05-24 ENCOUNTER — Encounter: Payer: Self-pay | Admitting: Gastroenterology

## 2020-05-24 ENCOUNTER — Other Ambulatory Visit: Payer: Self-pay

## 2020-05-24 ENCOUNTER — Ambulatory Visit (INDEPENDENT_AMBULATORY_CARE_PROVIDER_SITE_OTHER): Payer: Self-pay | Admitting: Gastroenterology

## 2020-05-24 VITALS — BP 120/80 | HR 68 | Ht 62.25 in | Wt 170.2 lb

## 2020-05-24 DIAGNOSIS — R110 Nausea: Secondary | ICD-10-CM

## 2020-05-24 DIAGNOSIS — R768 Other specified abnormal immunological findings in serum: Secondary | ICD-10-CM

## 2020-05-24 DIAGNOSIS — R1012 Left upper quadrant pain: Secondary | ICD-10-CM

## 2020-05-24 DIAGNOSIS — R140 Abdominal distension (gaseous): Secondary | ICD-10-CM

## 2020-05-24 LAB — IMMUNOGLOBULINS A/E/G/M, SERUM
IgA/Immunoglobulin A, Serum: 70 mg/dL — ABNORMAL LOW (ref 87–352)
IgE (Immunoglobulin E), Serum: 6 IU/mL (ref 6–495)
IgG (Immunoglobin G), Serum: 1209 mg/dL (ref 586–1602)
IgM (Immunoglobulin M), Srm: 93 mg/dL (ref 26–217)

## 2020-05-24 MED ORDER — PANTOPRAZOLE SODIUM 40 MG PO TBEC: 40.0000 mg | DELAYED_RELEASE_TABLET | Freq: Every morning | ORAL | 0 refills | Status: DC

## 2020-05-24 NOTE — Patient Instructions (Addendum)
It was a pleasure to see you today. Based on our discussion, I am providing you with my recommendations below:  RECOMMENDATION(S):   I am recommending that you start Pantoprazole as written below. I would like to see you in the office in 6-8 weeks to evaluate your symptoms. We may consider referring your to Gynecology if the Pantoprazole does not resolve your symptoms.  PRESCRIPTION MEDICATION(S):   We have sent the following medication(s) to your pharmacy:  . Pantoprazole - please take 40mg  by mouth every morning for the next 3 months  NOTE: If your medication(s) requires a PRIOR AUTHORIZATION, we will receive notification from your pharmacy. Once received, the process to submit for approval may take up to 7-10 business days. You will be contacted about any denials we have received from your insurance company as well as alternatives recommended by your provider.  FOLLOW UP:  . I would like for you to follow up with me in 6-8 weeks. Your appointment has been scheduled on Monday, April 18th @ 1:50pm. Should you need to reschedule this appointment, please call the office at [phone] to reschedule.  BMI:  . If you are age 33 or younger, your body mass index should be between 19-25. Your Body mass index is 30.89 kg/m. If this is out of the aformentioned range listed, please consider follow up with your Primary Care Provider.   Thank you for trusting me with your gastrointestinal care!    Thornton Park, MD, MPH

## 2020-05-24 NOTE — Progress Notes (Addendum)
Referring Provider: No ref. 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 Reviewed recent EGD and pathology results. Evaluation for symptomatic gallbladder disease was negative. Will start treatment trial with daily PPI therapy. Avoid NSAIDs.   Clinical significance of low IgA is unclear: Awaiting consultation with Dr. Ernst Bowler.  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 Will await consultation with Dr. Ernst Bowler. Consider HLA-DQ2/DQ8 genotyping if appropriate after his consultation.   PLAN: - Avoid NSAIDs - Start pantoprazole 40 mg QAM x at least a 3 month treatment trial  - Referral to immunologist/allergist (Dr. Ernst Bowler)  - Consider trial of gluten free diet - Office follow-up 6-8 weeks, earlier if needed - GYN evaluation if no response to pantoprazole  Please see the "Patient Instructions" section for addition details about the plan.  HPI: Brooke Lam is a 40 y.o. female self-referred for evaluation of abdominal pain. I originally saw her in consultation 04/22/20.  She had an EGD performed 04/23/20. She returns in scheduled follow-up. The interval history is obtained through the patient with the assistance of a spanish interpreter. 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  She reports a one year history of intermittent 5/10 LUQ pain with associated gas, nausea and bloating. Triggered by fatty and greasy foods. No change with position or defecation. No other identified exacerbating or relieving features. No alarm features.   Seen in the ED last year and told it was probably acid  reflux.  Some constipation that is diet related. She thinks that this is a results of her disrupted eating habits from working the 3rd shift as a Chief Operating Officer.  Drinks whiskey on the weekend, but notes no change in symptoms or frequency of symptoms on the weekends.   Labs 06/06/19: normal liver enzymes Labs 09/20/19: WBC 7.5, hgb 11.9, MCV 86.2, platelets 252  Evaluation since her last visit includes: EGD 04/23/2020: chronic duodenitis, intraepithelial lymphocytosis, gastropathy Normal liver enzymes and lipase TTGA <1.0 IgA low at 70; IgG, IgM, and IgE are normal Abdominal ultrasound 04/29/20: echogenic liver, no gallstones, normal exam HIDA 05/14/20: Negative exam, GBEF 45%  She has not started the pantoprazole. She wanted a follow-up appointment first.   Father with constipation. No known family history of colon cancer or polyps. No family history of uterine/endometrial cancer, pancreatic cancer or gastric/stomach cancer.   Past Medical History:  Diagnosis Date  . Ectopic pregnancy   . Preterm labor     Past Surgical History:  Procedure Laterality Date  . ABDOMINAL HYSTERECTOMY    . BREAST BIOPSY Right 2013   FA  . CESAREAN SECTION     C/S x 4  . UPPER GASTROINTESTINAL ENDOSCOPY  04/23/2020    No current outpatient medications on file.   No current facility-administered medications for this visit.    Allergies as of 05/24/2020  . (No Known Allergies)    Family History  Problem Relation Age of Onset  . Diabetes Father   . Hypertension Father   . Diabetes Paternal Grandmother   . Diabetes Mother   . Hypertension Mother   . Diabetes Paternal Aunt   . Anesthesia problems Neg Hx   . Hearing loss Neg Hx   .  Other Neg Hx      Physical Exam: General:   Alert,  well-nourished, pleasant and cooperative in NAD Head:  Normocephalic and atraumatic. Eyes:  Sclera clear, no icterus.   Conjunctiva pink. Ears:  Normal auditory acuity. Nose:  No deformity, discharge,  or  lesions. Mouth:  No deformity or lesions.   Neck:  Supple; no masses or thyromegaly. Lungs:  Clear throughout to auscultation.   No wheezes. Heart:  Regular rate and rhythm; no murmurs. Abdomen:  Soft ,nontender, nondistended, normal bowel sounds, no rebound or guarding. No hepatosplenomegaly.  I am unable to reproduce her abdominal pain on exam.  Rectal:  Deferred  Msk:  Symmetrical. No boney deformities LAD: No inguinal or umbilical LAD Extremities:  No clubbing or edema. Neurologic:  Alert and  oriented x4;  grossly nonfocal Skin:  Intact without significant lesions or rashes. Psych:  Alert and cooperative. Normal mood and affect.   Fahim Kats L. Tarri Glenn, MD, MPH 05/24/2020, 10:10 AM

## 2020-05-29 ENCOUNTER — Encounter: Payer: No Typology Code available for payment source | Admitting: Gastroenterology

## 2020-05-29 ENCOUNTER — Telehealth: Payer: Self-pay

## 2020-05-29 NOTE — Telephone Encounter (Signed)
I return pt call, pt was inform that she need to be re-stablish care with the PCP since has been over a year she saw the PCP and then she can apply for the program we offer

## 2020-05-29 NOTE — Telephone Encounter (Signed)
Copied from [place] [id number]. Topic: General - Other >> May 29, 2020 12:26 PM Yvette Rack wrote: Reason for CRM: Pt request return call from [facility].   Former [facility] patient. Please follow up.

## 2020-06-13 ENCOUNTER — Ambulatory Visit: Payer: Self-pay | Admitting: Allergy & Immunology

## 2020-06-18 ENCOUNTER — Encounter: Payer: Self-pay | Admitting: Allergy & Immunology

## 2020-06-18 ENCOUNTER — Other Ambulatory Visit: Payer: Self-pay

## 2020-06-18 ENCOUNTER — Ambulatory Visit (INDEPENDENT_AMBULATORY_CARE_PROVIDER_SITE_OTHER): Payer: Self-pay | Admitting: Allergy & Immunology

## 2020-06-18 VITALS — BP 110/80 | HR 87 | Temp 98.2°F | Resp 16 | Ht 63.0 in | Wt 173.8 lb

## 2020-06-18 DIAGNOSIS — R768 Other specified abnormal immunological findings in serum: Secondary | ICD-10-CM

## 2020-06-18 DIAGNOSIS — R7689 Other specified abnormal immunological findings in serum: Secondary | ICD-10-CM

## 2020-06-18 DIAGNOSIS — R1084 Generalized abdominal pain: Secondary | ICD-10-CM

## 2020-06-18 DIAGNOSIS — K9049 Malabsorption due to intolerance, not elsewhere classified: Secondary | ICD-10-CM

## 2020-06-18 NOTE — Progress Notes (Signed)
NEW PATIENT  Date of Service/Encounter:  06/19/20  Consult requested by: Thornton Park, MD    Assessment:   Generalized abdominal pain    Food intolerance - with negative testing to the most common foods as well as selected other foods  IgA flagged as low - with normal IgG and IgM and no history of infections at all  Plan/Recommendations:   1. Generalized abdominal pain - Testing was negative to all of the foods tested. - There is a the low positive predictive value of food allergy testing and hence the high possibility of false positives. - In contrast, food allergy testing has a high negative predictive value, therefore if testing is negative we can be relatively assured that they are indeed negative.  - I do not think that this is a food allergy.  2. Low IgA - This level is certainly not in the range of selective IgA deficiency. - IgG and IgM were normal, which is reassuring. - Infectious history was unremarkable, so I do not think further work-up is warranted at this time.  3. Return if symptoms worsen or fail to improve.    Subjective:   Brooke Lam is a 40 y.o. female presenting today for evaluation of  Chief Complaint  Patient presents with  . Allergy Testing    Pain in the stomach in the upper left quadrant. Inflammation in colon and small intestine. Was sent here from PCP to see if she has a food allergy. No other test have proven that she has any other disease.     Brooke Lam has a history of the following: Patient Active Problem List   Diagnosis Date Noted  . 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 Dyspareunia 11/12/2014  . S/P emergency cesarean hysterectomy 08/01/2012  . Dysuria 09/17/2011    History obtained from: chart review and patient.  Brooke Lam was referred by Thornton Park, MD (Gastroenterology).  Brooke Lam is a 40 y.o. female presenting for an evaluation of possible food allergies as well as  addressing her flagged low IgA. She has a history of chronic abdominal pain. She reports some nausea with it. She also reports bloating. Symptoms have been going on for around one year. This is not associated with a particular food. She has been tolerating all of these foods without any problem at all. Symptoms occur in a variable fashion. Sometimes it even occurs outside of the setting of eating.   She does report some mucous in her throat and greenish discharge in the mucous. She is on pantoprazole which was started around two weeks ago. It might have helped some, but she is still having the pain. She was feeling uncomfortable a few days ago. She has no diarrhea but some nausea.   She seems to tolerate all of the major food allergens without adverse event.  She has not changed her diet at all.  She eats a wide variety of foods without any problems.  There is no particular food that causes her stomach to hurt worse.  She does bring up mice as a cause of her symptoms.  She evidently saw a mouse in her kitchen and wants to make sure that this is not related.  She literally has no other sign of atopic disease including allergic rhinitis, asthma, or eczema.  She has never had hives.  She has never had environmental or food allergy testing.   She does not have a history of recurrent infections. She does not remember the  last time that she needed antibiotics. Review of her chart shows a few prescriptions for Keflex back in 2014, 2015, and 21016. She does not remember why she had those.  She is followed by Dr. Thornton Park, Gastroenterology. She has had a very thorough work-up.  She had an EGD in February 2022 that showed chronic duodenitis as well as intraepithelial lymphocytosis.  She had normal liver enzymes and lipase.  At [facility] was negative.  Her IgA was low at 70.  IgG and IgM as well as IgE were all normal.  She had an abdominal ultrasound in February that showed an echogenic liver.  HIDA scan was  normal.  Otherwise, there is no history of other atopic diseases, including asthma, drug allergies, environmental allergies, stinging insect allergies, eczema, urticaria or contact dermatitis. There is no significant infectious history. Vaccinations are up to date.    Past Medical History: Patient Active Problem List   Diagnosis Date Noted  . 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 Dyspareunia 11/12/2014  . S/P emergency cesarean hysterectomy 08/01/2012  . Dysuria 09/17/2011    Medication List:  Allergies as of 06/18/2020   No Known Allergies     Medication List       Accurate as of June 18, 2020 11:59 PM. If you have any questions, ask your nurse or doctor.        multivitamin capsule Take 1 capsule by mouth daily.   pantoprazole 40 MG tablet Commonly known as: PROTONIX Take 1 tablet (40 mg total) by mouth every morning.       Birth History: non-contributory  Developmental History: non-contributory  Past Surgical History: Past Surgical History:  Procedure Laterality Date  . ABDOMINAL HYSTERECTOMY    . BREAST BIOPSY Right 2013   FA  . CESAREAN SECTION     C/S x 4  . UPPER GASTROINTESTINAL ENDOSCOPY  04/23/2020     Family History: Family History  Problem Relation Age of Onset  . Diabetes Father   . Hypertension Father   . Diabetes Paternal Grandmother   . Diabetes Mother   . Hypertension Mother   . Diabetes Paternal Aunt   . Anesthesia problems Neg Hx   . Hearing loss Neg Hx   . Other Neg Hx      Social History: Brooke Lam lives at home with her family. There are no animals in the home. There is no smoking exposure noted.     Review of Systems  Constitutional: Negative.  Negative for chills, fever, malaise/fatigue and weight loss.  HENT: Negative.  Negative for congestion, ear discharge and ear pain.   Eyes: Negative for pain, discharge and redness.  Respiratory: Negative for cough, sputum production, shortness of breath and wheezing.    Cardiovascular: Negative.  Negative for chest pain and palpitations.  Gastrointestinal: Positive for abdominal pain. Negative for blood in stool, constipation, diarrhea, heartburn, nausea and vomiting.  Skin: Negative.  Negative for itching and rash.  Neurological: Negative for dizziness and headaches.  Endo/Heme/Allergies: Negative for environmental allergies. Does not bruise/bleed easily.       Objective:   Blood pressure 110/80, pulse 87, temperature 98.2 F (36.8 C), resp. rate 16, height 5\' 3"  (1.6 m), weight 173 lb 12.8 oz (78.8 kg), last menstrual period 10/08/2011, SpO2 99 %. Body mass index is 30.79 kg/m.   Physical Exam:   Physical Exam Constitutional:      Appearance: She is well-developed.  HENT:     Head: Normocephalic and atraumatic.  Right Ear: Tympanic membrane, ear canal and external ear normal. No drainage, swelling or tenderness. Tympanic membrane is not injected, scarred, erythematous, retracted or bulging.     Left Ear: Tympanic membrane, ear canal and external ear normal. No drainage, swelling or tenderness. Tympanic membrane is not injected, scarred, erythematous, retracted or bulging.     Nose: No nasal deformity, septal deviation, mucosal edema or rhinorrhea.     Right Turbinates: Enlarged.     Left Turbinates: Enlarged.     Right Sinus: No maxillary sinus tenderness or frontal sinus tenderness.     Left Sinus: No maxillary sinus tenderness or frontal sinus tenderness.     Mouth/Throat:     Mouth: Mucous membranes are not pale and not dry.     Pharynx: Uvula midline.  Eyes:     General:        Right eye: No discharge.        Left eye: No discharge.     Conjunctiva/sclera: Conjunctivae normal.     Right eye: Right conjunctiva is not injected. No chemosis.    Left eye: Left conjunctiva is not injected. No chemosis.    Pupils: Pupils are equal, round, and reactive to light.  Cardiovascular:     Rate and Rhythm: Normal rate and regular rhythm.      Heart sounds: Normal heart sounds.  Pulmonary:     Effort: Pulmonary effort is normal. No tachypnea, accessory muscle usage or respiratory distress.     Breath sounds: Normal breath sounds. No wheezing, rhonchi or rales.  Chest:     Chest wall: No tenderness.  Abdominal:     General: Bowel sounds are normal.     Tenderness: There is generalized abdominal tenderness. There is no guarding or rebound.  Lymphadenopathy:     Head:     Right side of head: No submandibular, tonsillar or occipital adenopathy.     Left side of head: No submandibular, tonsillar or occipital adenopathy.     Cervical: No cervical adenopathy.  Skin:    Coloration: Skin is not pale.     Findings: No abrasion, erythema, petechiae or rash. Rash is not papular, urticarial or vesicular.  Neurological:     Mental Status: She is alert.  Psychiatric:        Behavior: Behavior is cooperative.      Diagnostic studies:   Allergy Studies:     Airborne Adult Perc - 06/18/20 1446    Time Antigen Placed 1446    Allergen Manufacturer Lavella Hammock    Location Back    Number of Test 1    58. Mouse Negative          Food Adult Perc - 06/18/20 1400    Time Antigen Placed 1445    Allergen Manufacturer Lavella Hammock    Location Back    Number of allergen test 24     Control-buffer 50% Glycerol Negative    Control-Histamine 1 mg/ml 2+    1. Peanut Negative    2. Soybean Negative    3. Wheat Negative    4. Sesame Negative    5. Milk, cow Negative    6. Egg White, Chicken Negative    7. Casein Negative    8. Shellfish Mix Negative    9. Fish Mix Negative    10. Cashew Negative    11. Pecan Food Negative    12. [place] Negative    13. Almond Negative    14. Hazelnut Negative    15.  [place] nut Negative    16. Coconut Negative    17. Pistachio Negative    25. Shrimp Negative    29. Scallops Negative    34. Rice Negative    37. Pork Negative    38. [place] Meat Negative    40. Beef Negative    70. Garlic Negative            Allergy testing results were read and interpreted by myself, documented by clinical staff.         Salvatore Marvel, MD [facility]

## 2020-06-18 NOTE — Patient Instructions (Addendum)
1. Generalized abdominal pain - Testing was negative to all of the foods tested. - There is a the low positive predictive value of food allergy testing and hence the high possibility of false positives. - In contrast, food allergy testing has a high negative predictive value, therefore if testing is negative we can be relatively assured that they are indeed negative.  - I do not think that this is a food allergy.  2. Return if symptoms worsen or fail to improve.    Please inform us of any Emergency Department visits, hospitalizations, or changes in symptoms. Call us before going to the ED for breathing or allergy symptoms since we might be able to fit you in for a sick visit. Feel free to contact us anytime with any questions, problems, or concerns.  It was a pleasure to see you again today!  Websites that have reliable patient information: 1. American Academy of Asthma, Allergy, and Immunology: www.aaaai.org 2. Food Allergy Research and Education (FARE): foodallergy.org 3. Mothers of Asthmatics: http://www.asthmacommunitynetwork.org 4. American College of Allergy, Asthma, and Immunology: www.acaai.org   COVID-19 Vaccine Information can be found at: ShippingScam.co.uk For questions related to vaccine distribution or appointments, please email vaccine@ .com or call [phone].   We realize that you might be concerned about having an allergic reaction to the COVID19 vaccines. To help with that concern, WE ARE OFFERING THE COVID19 VACCINES IN OUR OFFICE! Ask the front desk for dates!     "Like" [place] on Facebook and Instagram for our latest updates!      A healthy democracy works best when [employer] participate! Make sure you are registered to vote! If you have moved or changed any of your contact information, you will need to get this updated before voting!  In some cases, you MAY be able to register to vote online:  [email or web address]

## 2020-06-19 ENCOUNTER — Encounter: Payer: Self-pay | Admitting: Allergy & Immunology

## 2020-07-08 ENCOUNTER — Ambulatory Visit: Payer: No Typology Code available for payment source | Admitting: Gastroenterology

## 2020-09-02 ENCOUNTER — Ambulatory Visit: Payer: Self-pay | Attending: Internal Medicine | Admitting: Internal Medicine

## 2020-09-02 ENCOUNTER — Encounter: Payer: Self-pay | Admitting: Internal Medicine

## 2020-09-02 ENCOUNTER — Other Ambulatory Visit: Payer: Self-pay

## 2020-09-02 VITALS — BP 110/76 | HR 64 | Resp 16 | Ht 63.0 in | Wt 172.0 lb

## 2020-09-02 DIAGNOSIS — L989 Disorder of the skin and subcutaneous tissue, unspecified: Secondary | ICD-10-CM

## 2020-09-02 DIAGNOSIS — N898 Other specified noninflammatory disorders of vagina: Secondary | ICD-10-CM

## 2020-09-02 MED ORDER — FLUCONAZOLE 150 MG PO TABS: 150.0000 mg | ORAL_TABLET | Freq: Once | ORAL | 0 refills | Status: AC

## 2020-09-02 MED ORDER — SULFAMETHOXAZOLE-TRIMETHOPRIM 800-160 MG PO TABS: 1.0000 | ORAL_TABLET | Freq: Two times a day (BID) | ORAL | 0 refills | Status: DC

## 2020-09-02 NOTE — Progress Notes (Signed)
Patient ID: Brooke Lam, female    DOB: 09-11-1980  MRN: [id number]  CC: New Patient (Initial Visit)   Subjective: Brooke Lam is a 40 y.o. female who presents for est care.   Brooke Lam, from [facility], is with her and interprets. Her concerns today include:   No previous PCP. Reports no chronic med issues and not on any meds.  Main concern today is concern for vaginal bacterial infection. Has vaginal dischg x 2 mths intermittently.  Has foul odor.  No vaginal itching.  Sexually active with 1 partner but not active in past 3 mths. She has been using Miconazole cream OTC 7 day course and it seems like this would take it away but then it would come back..   Dx with yeast infection 3 mths ago. Had hysterectomy but ovaries left in place.  Had hysterectomy 2014 because of issue with pregnancy Placenta previa at [facility]. Also c/o  bumps in vaginal area and RT breast.   Had 1 lanced before in the groin area when she lived in [place].  She reports they go and come and "are filled with puss."  Has one currently in vaginal area x 8 days.  One on RT breast has resolved on its own.   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     pantoprazole (PROTONIX) 40 MG tablet Take 1 tablet (40 mg total) by mouth every morning. 90 tablet 0   No current facility-administered medications on file prior to visit.    No Known Allergies  Social History   Socioeconomic History   Marital status: Legally Separated    Spouse name: Not on file   Number of children: 4   Years of education: Not on file   Highest education level: High school graduate  Occupational History   Occupation:  bartender  Tobacco Use   Smoking status: Former    Pack years: 0.00    Types: Cigarettes    Quit date: 2005    Years since quitting: 17.4   Smokeless tobacco: Never  Vaping Use   Vaping Use: Never used  Substance and Sexual Activity   Alcohol use: Yes    Alcohol/week: 0.0 standard drinks    Comment: every 8 days 5-8 drinks of wine and whiskey   Drug use: No   Sexual activity: Yes    Birth control/protection: None, Surgical  Other Topics Concern   Not on file  Social History Narrative   Not on file   Social Determinants of Health   Financial Resource Strain: Not on file  Food Insecurity: Not on file  Transportation Needs: No Transportation Needs   Lack of Transportation (Medical): No   Lack of Transportation (Non-Medical): No  Physical Activity: Not on file  Stress: Not on file  Social Connections: Not on file  Intimate Partner Violence: Not on file    Family History  Problem Relation Age of Onset   Diabetes Father    Hypertension Father    Diabetes Paternal Grandmother    Diabetes Mother    Hypertension Mother    Diabetes Paternal Aunt    Anesthesia problems Neg Hx    Hearing loss Neg Hx  Other Neg Hx     Past Surgical History:  Procedure Laterality Date   ABDOMINAL HYSTERECTOMY     BREAST BIOPSY Right 2013   FA   CESAREAN SECTION     C/S x 4   UPPER GASTROINTESTINAL ENDOSCOPY  04/23/2020    ROS: Review of Systems Negative except as stated above  PHYSICAL EXAM: BP 110/76   Pulse 64   Resp 16   Ht 5\' 3"  (1.6 m)   Wt 172 lb (78 kg)   LMP 10/08/2011   SpO2 98%   BMI 30.47 kg/m   Physical Exam  General appearance - alert, well appearing, middle-aged Caucasian female and in no distress.  She has her young son with her. Mental status - normal mood, behavior, speech, dress, motor activity, and thought processes Chest - clear to auscultation, no wheezes, rales or rhonchi, symmetric air entry Heart - normal rate, regular rhythm, normal S1, S2, no  murmurs, rubs, clicks or gallops Skin -CMA Pollock present for breast and groin exam: Right breast: No abscess or pimple seen at this time.  She has a small darkened area in the upper outer quadrant where she reports the bump was previously.  Groin area: She has a small puslike filled bump to the right of the clitoris.  The pubic hair has been shaved.  CMP Latest Ref Rng & Units 04/22/2020 09/20/2019 06/06/2019  Glucose 70 - 99 mg/dL - 117(H) 119(H)  BUN 6 - 20 mg/dL - 20 14  Creatinine 0.44 - 1.00 mg/dL - 0.80 0.78  Sodium 135 - 145 mmol/L - 141 138  Potassium 3.5 - 5.1 mmol/L - 4.1 3.6  Chloride 98 - 111 mmol/L - 106 102  CO2 22 - 32 mmol/L - 26 28  Calcium 8.9 - 10.3 mg/dL - 9.4 9.0  Total Protein 6.0 - 8.3 g/dL 7.7 - 7.3  Total Bilirubin 0.2 - 1.2 mg/dL 0.5 - 0.5  Alkaline Phos 39 - 117 U/L 56 - 56  AST 0 - 37 U/L 18 - 26  ALT 0 - 35 U/L 9 - 17   Lipid Panel  No results found for: CHOL, TRIG, HDL, CHOLHDL, VLDL, LDLCALC, LDLDIRECT  CBC    Component Value Date/Time   WBC 7.5 09/20/2019 1602   RBC 4.41 09/20/2019 1602   HGB 11.9 (L) 09/20/2019 1602   HCT 38.0 09/20/2019 1602   PLT 252 09/20/2019 1602   MCV 86.2 09/20/2019 1602   MCH 27.0 09/20/2019 1602   MCHC 31.3 09/20/2019 1602   RDW 13.4 09/20/2019 1602   LYMPHSABS 1.6 07/06/2014 1153   MONOABS 0.2 07/06/2014 1153   EOSABS 0.1 07/06/2014 1153   BASOSABS 0.0 07/06/2014 1153    ASSESSMENT AND PLAN: 1. Vaginal discharge Will treat empirically with Diflucan.  Self swab done today. - fluconazole (DIFLUCAN) 150 MG tablet; Take 1 tablet (150 mg total) by mouth once for 1 dose.  Dispense: 1 tablet; Refill: 0 - Cervicovaginal ancillary only  2. Bumps on skin Likely MRSA type infection versus hair bump. - sulfamethoxazole-trimethoprim (BACTRIM DS) 800-160 MG tablet; Take 1 tablet by mouth 2 (two) times daily.  Dispense: 14 tablet; Refill: 0  Patient was given the opportunity to ask questions.  Patient verbalized  understanding of the plan and was able to repeat key elements of the plan.   No orders of the defined types were placed in this encounter.    Requested Prescriptions   Signed Prescriptions Disp Refills   fluconazole (DIFLUCAN) 150 MG tablet  1 tablet 0    Sig: Take 1 tablet (150 mg total) by mouth once for 1 dose.   sulfamethoxazole-trimethoprim (BACTRIM DS) 800-160 MG tablet 14 tablet 0    Sig: Take 1 tablet by mouth 2 (two) times daily.    Return if symptoms worsen or fail to improve.  [name], MD, FACP

## 2020-11-08 ENCOUNTER — Ambulatory Visit: Payer: Self-pay

## 2020-11-08 NOTE — Telephone Encounter (Signed)
Will forward to provider  

## 2020-11-08 NOTE — Telephone Encounter (Signed)
Using Spanish interpreter Remo Lipps, # [phone], pt. States she has had a headache x 3 days. Headache comes and goes and is worse when she moves her head a certain way. Has blurry vision that comes and goes and fatigue. Has not tried any OTC pain medications. Would like to be seen today. Office currently closed for lunch. Please advise pt.   Reason for Disposition  [1] MODERATE headache (e.g., interferes with normal activities) AND [2] present > 24 hours AND [3] unexplained  (Exceptions: analgesics not tried, typical migraine, or headache part of viral illness)  Answer Assessment - Initial Assessment Questions 1. LOCATION: "Where does it hurt?"      Left side 2. ONSET: "When did the headache start?" (Minutes, hours or days)      3 days 3. PATTERN: "Does the pain come and go, or has it been constant since it started?"     Comes and goes 4. SEVERITY: "How bad is the pain?" and "What does it keep you from doing?"  (e.g., Scale 1-10; mild, moderate, or severe)   - MILD (1-3): doesn't interfere with normal activities    - MODERATE (4-7): interferes with normal activities or awakens from sleep    - SEVERE (8-10): excruciating pain, unable to do any normal activities        7 5. RECURRENT SYMPTOM: "Have you ever had headaches before?" If Yes, ask: "When was the last time?" and "What happened that time?"      No 6. CAUSE: "What do you think is causing the headache?"     Unsure 7. MIGRAINE: "Have you been diagnosed with migraine headaches?" If Yes, ask: "Is this headache similar?"      No 8. HEAD INJURY: "Has there been any recent injury to the head?"      No 9. OTHER SYMPTOMS: "Do you have any other symptoms?" (fever, stiff neck, eye pain, sore throat, cold symptoms)     Blurred vision 10. PREGNANCY: "Is there any chance you are pregnant?" "When was your last menstrual period?"       No  Protocols used: Headache-A-AH

## 2020-11-09 NOTE — Telephone Encounter (Signed)
Phone call placed to patient yesterday evening around 6:30 PM using [place] interpreters.  Interpreter was [place] ID number [id number]. Inform patient that I was calling back in regards to her call about headaches and wanting to be seen.  Inform patient that facilities close for the day but if headache is severe she can be seen at a [facility] urgent care facility.  Patient states that she had already spoken with the nurse and she knows to go out and be seen if needed.  However she is hoping that she can be seen next week in our clinic.  I told patient that I will give her name to the front desk schedulers to try to get her in next week with me or any available provider.

## 2020-11-11 NOTE — Telephone Encounter (Signed)
Please contact pt and see if she is able to do an appt with Levada Dy on 8/24 virtual(anytime) or 8/25 at 410pm.

## 2020-11-14 ENCOUNTER — Other Ambulatory Visit: Payer: Self-pay

## 2020-11-14 ENCOUNTER — Ambulatory Visit: Payer: Self-pay | Attending: Internal Medicine | Admitting: Physician Assistant

## 2020-11-14 VITALS — BP 128/90 | HR 80 | Resp 16 | Wt 168.2 lb

## 2020-11-14 DIAGNOSIS — R110 Nausea: Secondary | ICD-10-CM

## 2020-11-14 DIAGNOSIS — R519 Headache, unspecified: Secondary | ICD-10-CM

## 2020-11-14 DIAGNOSIS — R5383 Other fatigue: Secondary | ICD-10-CM

## 2020-11-14 MED ORDER — PROMETHAZINE HCL 12.5 MG PO TABS
12.5000 mg | ORAL_TABLET | Freq: Three times a day (TID) | ORAL | 0 refills | Status: DC | PRN
Start: 2020-11-14 — End: 2021-01-23
  Filled 2020-11-14: qty 20, 7d supply, fill #0

## 2020-11-14 MED ORDER — METHOCARBAMOL 500 MG PO TABS
1000.0000 mg | ORAL_TABLET | Freq: Three times a day (TID) | ORAL | 0 refills | Status: DC | PRN
  Filled 2020-11-14: qty 90, 15d supply, fill #0

## 2020-11-14 NOTE — Patient Instructions (Signed)
General Headache Without Cause A headache is pain or discomfort that is felt around the head or neck area. There are many causes and types of headaches. In some cases, the cause may not be found. Follow these instructions at home: Watch your condition for any changes. Let your doctor know about them. Take these steps to help with your condition: Managing pain   Take over-the-counter and prescription medicines only as told by your doctor. Lie down in a dark, quiet room when you have a headache. If told, put ice on your head and neck area: Put ice in a plastic bag. Place a towel between your skin and the bag. Leave the ice on for 20 minutes, 2-3 times per day. If told, put heat on the affected area. Use the heat source that your doctor recommends, such as a moist heat pack or a heating pad. Place a towel between your skin and the heat source. Leave the heat on for 20-30 minutes. Remove the heat if your skin turns bright red. This is very important if you are unable to feel pain, heat, or cold. You may have a greater risk of getting burned. Keep lights dim if bright lights bother you or make your headaches worse. Eating and drinking Eat meals on a regular schedule. If you drink alcohol: Limit how much you use to: 0-1 drink a day for women. 0-2 drinks a day for men. Be aware of how much alcohol is in your drink. In the [place] one drink equals one 12 oz bottle of beer (355 mL), one 5 oz glass of wine (148 mL), or one 1 oz glass of hard liquor (44 mL). Stop drinking caffeine, or reduce how much caffeine you drink. General instructions  Keep a journal to find out if certain things bring on headaches. For example, write down: What you eat and drink. How much sleep you get. Any change to your diet or medicines. Get a massage or try other ways to relax. Limit stress. Sit up straight. Do not tighten (tense) your muscles. Do not use any products that contain nicotine or tobacco. This includes  cigarettes, e-cigarettes, and chewing tobacco. If you need help quitting, ask your doctor. Exercise regularly as told by your doctor. Get enough sleep. This often means 7-9 hours of sleep each night. Keep all follow-up visits as told by your doctor. This is important. Contact a doctor if: Your symptoms are not helped by medicine. You have a headache that feels different than the other headaches. You feel sick to your stomach (nauseous) or you throw up (vomit). You have a fever. Get help right away if: Your headache gets very bad quickly. Your headache gets worse after a lot of physical activity. You keep throwing up. You have a stiff neck. You have trouble seeing. You have trouble speaking. You have pain in the eye or ear. Your muscles are weak or you lose muscle control. You lose your balance or have trouble walking. You feel like you will pass out (faint) or you pass out. You are mixed up (confused). You have a seizure. Summary A headache is pain or discomfort that is felt around the head or neck area. There are many causes and types of headaches. In some cases, the cause may not be found. Keep a journal to help find out what causes your headaches. Watch your condition for any changes. Let your doctor know about them. Contact a doctor if you have a headache that is different from usual, or   if your headache is not helped by medicine. Get help right away if your headache gets very bad, you throw up, you have trouble seeing, you lose your balance, or you have a seizure. This information is not intended to replace advice given to you by your health care provider. Make sure you discuss any questions you have with your health care provider. Document Revised: 09/27/2017 Document Reviewed: 09/27/2017 Elsevier Patient Education  2022 Elsevier Inc.  

## 2020-11-14 NOTE — Progress Notes (Signed)
Patient ID: Brooke Lam, female   DOB: Jul 27, 1980, 40 y.o.   MRN: [id number]   Brooke Lam, is a 40 y.o. female  [id number]  [id number]  DOB - 01-24-1981  Chief Complaint  Patient presents with   Headache       Subjective:   Brooke Lam is a 40 y.o. female here today for HA that has been going on for 1 month.  No new activities or exercises.  No vision changes.  Pain is in the R back of the head and wraps all the way around to the front.  Occasional nausea/no vomiting.  No congestion.  Advil with minimal relief.  Pain is moderate.  No FH brain aneurysm/clot at a young age.  No major lifestyle changes  No problems updated.  ALLERGIES: No Known Allergies  PAST MEDICAL HISTORY: Past Medical History:  Diagnosis Date   Ectopic pregnancy    Preterm labor     MEDICATIONS AT HOME: Prior to Admission medications   Medication Sig Start Date End Date Taking? Authorizing Provider  methocarbamol (ROBAXIN) 500 MG tablet Take 2 tablets (1,000 mg total) by mouth every 8 (eight) hours as needed for muscle spasms. 11/14/20  Yes Freeman Caldron M, PA-C  promethazine (PHENERGAN) 12.5 MG tablet Take 1 tablet (12.5 mg total) by mouth every 8 (eight) hours as needed for nausea or vomiting. 11/14/20  Yes Argentina Donovan, PA-C  Multiple Vitamin (MULTIVITAMIN) capsule Take 1 capsule by mouth daily. Patient not taking: Reported on 11/14/2020    [provider]  pantoprazole (PROTONIX) 40 MG tablet Take 1 tablet (40 mg total) by mouth every morning. Patient not taking: Reported on 11/14/2020 05/24/20   Thornton Park, MD  sulfamethoxazole-trimethoprim (BACTRIM DS) 800-160 MG tablet Take 1 tablet by mouth 2 (two) times daily. Patient not taking: Reported on 11/14/2020 09/02/20   Ladell Pier, MD    ROS otherwise neg: Neg neuro  Objective:   Vitals:   11/14/20 1518  BP: 128/90  Pulse: 80  Resp: 16  SpO2: 97%  Weight: 168 lb 3.2 oz (76.3 kg)    Exam General appearance : Awake, alert, not in any distress HEENT: Atraumatic and Normocephalic.  EOM intact.   Neck: Supple, no JVD. No cervical lymphadenopathy.  Chest: Good air entry bilaterally, CTAB.  No rales/rhonchi/wheezing CVS: S1 S2 regular, no murmurs.  Extremities: B/L Lower Ext shows no edema, both legs are warm to touch.  Facial symmetry  WNL Neurology: Awake alert, and oriented X 3, CN II-XII intact, Non focal Skin: No Rash  Data Review Lab Results  Component Value Date   HGBA1C 6.0 (H) 07/06/2014    Assessment & Plan   1. New onset headache No red flags.  To ED/call 911 if worsens.  Patient verbalizes understanding - Comprehensive metabolic panel - Thyroid Panel With TSH - CT HEAD WO CONTRAST (5MM); Future - methocarbamol (ROBAXIN) 500 MG tablet; Take 2 tablets (1,000 mg total) by mouth every 8 (eight) hours as needed for muscle spasms.  Dispense: 90 tablet; Refill: 0  2. Nausea - Comprehensive metabolic panel - CT HEAD WO CONTRAST (5MM); Future - promethazine (PHENERGAN) 12.5 MG tablet; Take 1 tablet (12.5 mg total) by mouth every 8 (eight) hours as needed for nausea or vomiting.  Dispense: 20 tablet; Refill: 0  3. Other fatigue - Thyroid Panel With TSH - CBC with Differential/Platelet    Patient have been counseled extensively about nutrition and exercise. Other issues discussed during this visit include:  low cholesterol diet, weight control and daily exercise, foot care, annual eye examinations at Ophthalmology, importance of adherence with medications and regular follow-up. We also discussed long term complications of uncontrolled diabetes and hypertension.   Return in about 3 months (around 02/14/2021) for PCP.  The patient was given clear instructions to go to ER or return to medical center if symptoms don't improve, worsen or new problems develop. The patient verbalized understanding. The patient was told to call to get lab results if they haven't  heard anything in the next week.      Freeman Caldron, PA-C [facility] and [facility] [place], [place]   11/14/2020, 3:35 PM

## 2020-11-15 LAB — CBC WITH DIFFERENTIAL/PLATELET
Basophils Absolute: 0 10*3/uL (ref 0.0–0.2)
Basos: 1 %
EOS (ABSOLUTE): 0.1 10*3/uL (ref 0.0–0.4)
Eos: 2 %
Hematocrit: 37.8 % (ref 34.0–46.6)
Hemoglobin: 12.5 g/dL (ref 11.1–15.9)
Immature Grans (Abs): 0 10*3/uL (ref 0.0–0.1)
Immature Granulocytes: 0 %
Lymphocytes Absolute: 1.7 10*3/uL (ref 0.7–3.1)
Lymphs: 27 %
MCH: 27.6 pg (ref 26.6–33.0)
MCHC: 33.1 g/dL (ref 31.5–35.7)
MCV: 83 fL (ref 79–97)
Monocytes Absolute: 0.3 10*3/uL (ref 0.1–0.9)
Monocytes: 5 %
Neutrophils Absolute: 4 10*3/uL (ref 1.4–7.0)
Neutrophils: 65 %
Platelets: 249 10*3/uL (ref 150–450)
RBC: 4.53 x10E6/uL (ref 3.77–5.28)
RDW: 13.2 % (ref 11.7–15.4)
WBC: 6.2 10*3/uL (ref 3.4–10.8)

## 2020-11-15 LAB — THYROID PANEL WITH TSH
Free Thyroxine Index: 2.3 (ref 1.2–4.9)
T3 Uptake Ratio: 31 % (ref 24–39)
T4, Total: 7.4 ug/dL (ref 4.5–12.0)
TSH: 0.802 u[IU]/mL (ref 0.450–4.500)

## 2020-11-15 LAB — COMPREHENSIVE METABOLIC PANEL WITH GFR
ALT: 10 IU/L (ref 0–32)
AST: 26 IU/L (ref 0–40)
Albumin/Globulin Ratio: 1.8 (ref 1.2–2.2)
Albumin: 4.8 g/dL (ref 3.8–4.8)
Alkaline Phosphatase: 79 IU/L (ref 44–121)
BUN/Creatinine Ratio: 27 — ABNORMAL HIGH (ref 9–23)
BUN: 22 mg/dL (ref 6–24)
Bilirubin Total: <0.2 mg/dL (ref 0.0–1.2)
CO2: 23 mmol/L (ref 20–29)
Calcium: 9.5 mg/dL (ref 8.7–10.2)
Chloride: 104 mmol/L (ref 96–106)
Creatinine, Ser: 0.82 mg/dL (ref 0.57–1.00)
Globulin, Total: 2.6 g/dL (ref 1.5–4.5)
Glucose: 109 mg/dL — ABNORMAL HIGH (ref 65–99)
Potassium: 3.9 mmol/L (ref 3.5–5.2)
Sodium: 143 mmol/L (ref 134–144)
Total Protein: 7.4 g/dL (ref 6.0–8.5)
eGFR: 92 mL/min/1.73

## 2020-11-17 ENCOUNTER — Emergency Department (HOSPITAL_COMMUNITY)
Admission: EM | Admit: 2020-11-17 | Discharge: 2020-11-17 | Disposition: A | Discharge status: Home / Self Care | Payer: No Typology Code available for payment source | Attending: Emergency Medicine | Admitting: Emergency Medicine

## 2020-11-17 ENCOUNTER — Other Ambulatory Visit: Payer: Self-pay

## 2020-11-17 ENCOUNTER — Encounter (HOSPITAL_COMMUNITY): Payer: Self-pay

## 2020-11-17 ENCOUNTER — Emergency Department (HOSPITAL_COMMUNITY): Payer: No Typology Code available for payment source

## 2020-11-17 DIAGNOSIS — S52572A Other intraarticular fracture of lower end of left radius, initial encounter for closed fracture: Secondary | ICD-10-CM | POA: Insufficient documentation

## 2020-11-17 DIAGNOSIS — Y92009 Unspecified place in unspecified non-institutional (private) residence as the place of occurrence of the external cause: Secondary | ICD-10-CM | POA: Insufficient documentation

## 2020-11-17 DIAGNOSIS — W19XXXA Unspecified fall, initial encounter: Secondary | ICD-10-CM | POA: Insufficient documentation

## 2020-11-17 DIAGNOSIS — Z87891 Personal history of nicotine dependence: Secondary | ICD-10-CM | POA: Insufficient documentation

## 2020-11-17 DIAGNOSIS — Y9301 Activity, walking, marching and hiking: Secondary | ICD-10-CM | POA: Insufficient documentation

## 2020-11-17 DIAGNOSIS — M7989 Other specified soft tissue disorders: Secondary | ICD-10-CM | POA: Insufficient documentation

## 2020-11-17 LAB — POC URINE PREG, ED: Preg Test, Ur: NEGATIVE

## 2020-11-17 MED ORDER — OXYCODONE-ACETAMINOPHEN 5-325 MG PO TABS
1.0000 | ORAL_TABLET | Freq: Once | ORAL | Status: AC
  Administered 2020-11-17: 1 via ORAL
  Filled 2020-11-17: qty 1

## 2020-11-17 NOTE — ED Provider Notes (Signed)
Emergency Medicine Provider Triage Evaluation Note  Brooke Lam , a 40 y.o. female  was evaluated in triage.  Pt complains of left wrist right hand pain after fall.  Patient tripped opening a door just prior to arrival.  Primary concern is left wrist, patient reports unable to move wrist due to pain.  Additionally patient is noted some right hand pain since she arrived at the hospital.  Denies head injury, loss consciousness, blood thinner use, headache, neck pain, back pain, chest pain, abdominal pain, pain of the lower extremities or any additional concerns. Review of Systems  Positive: Left wrist pain, right hand pain Negative: Head injury, loss consciousness, blood thinner use, headache, neck pain, chest pain, back pain, abdominal pain or any additional concerns.  Physical Exam  BP (!) 124/95 (BP Location: Right Arm)   Pulse 77   Temp 98.7 F (37.1 C) (Oral)   Resp 15   LMP 10/08/2011   SpO2 98%  Gen:   Awake, no distress   Resp:  Normal effort  MSK:     Left wrist: Bruising, mild  swelling.  No skin break or tenting.  ROM limited due to pain.  Tender to palpation at the distal radius and ulna.  No TTP of the fingers or hand.  No TTP at the elbow or shoulder.  Radial pulse intact.  Capillary fill and sensation intact to all fingers.  Right hand: No bruising or significant swelling.  No tenting or skin break.  ROM somewhat limited due to pain.  TTP at the thenar eminence.  No TTP at the wrist elbow or shoulder.  Radial pulse intact.  Capillary fill and sensation intact to all fingers.  Other: No midline spinal tenderness to palpation.    Medical Decision Making  Medically screening exam initiated at 8:45 AM.  Appropriate orders placed.  Brooke Lam was informed that the remainder of the evaluation will be completed by another provider, this initial triage assessment does not replace that evaluation, and the importance of remaining in the ED until their  evaluation is complete.    Note: Portions of this report may have been transcribed using voice recognition software. Every effort was made to ensure accuracy; however, inadvertent computerized transcription errors may still be present.    Deliah Boston, PA-C 11/17/20 0848    Lorelle Gibbs, DO 11/18/20 1546

## 2020-11-17 NOTE — Progress Notes (Addendum)
Orthopedic Tech Progress Note Patient Details:  Brinklee Cruser May 16, 1980 [id number]  Communicated directly with Darrick Meigs, PA-C about only applying a single sugar tong instead of a double due to the indications with pt's type of injury. We agreed a double sugar tong was excessive given location of the fx.  Ortho Devices Type of Ortho Device: Sugartong splint, Sling immobilizer Ortho Device/Splint Location: LUE Ortho Device/Splint Interventions: Ordered, Application, Adjustment   Post Interventions Patient Tolerated: Well Instructions Provided: Care of device, Adjustment of device  Jp Eastham Jeri Modena 11/17/2020, 3:30 PM

## 2020-11-17 NOTE — ED Provider Notes (Signed)
[facility] EMERGENCY DEPARTMENT Provider Note   CSN: [id number] Arrival date & time: 11/17/20  [id number]     History Chief Complaint  Patient presents with   Arm Injury   Fall    Brooke Lam is a 40 y.o. female no significant past medical history who presents after sustaining a [place] onto both of her arms last night when she was trying to exit her car.  Patient reports she had significant pain after catching herself, however she did not initially think that it was broken, took some Advil for pain relief.  When she woke up today the pain was much worse and she felt she needed evaluation.  Patient reports slight needles and pin sensation in her left fingertips, present today but not last night.  Patient rates the pain 10/10 at time of evaluation, and has not taken anything today for her pain.   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   Dyspareunia 11/12/2014   S/P emergency cesarean hysterectomy 08/01/2012   Dysuria 09/17/2011    Past Surgical History:  Procedure Laterality Date   ABDOMINAL HYSTERECTOMY     BREAST BIOPSY Right 2013   FA   CESAREAN SECTION     C/S x 4   UPPER GASTROINTESTINAL ENDOSCOPY  04/23/2020     OB History     Gravida  7   Para  5   Term  3   Preterm  2   AB  2   Living  4      SAB  1   IAB  0   Ectopic  1   Multiple  0   Live Births  4           Family History  Problem Relation Age of Onset   Diabetes Father    Hypertension Father    Diabetes Paternal Grandmother    Diabetes Mother    Hypertension Mother    Diabetes Paternal Aunt    Anesthesia problems Neg Hx    Hearing loss Neg Hx    Other Neg Hx     Social History   Tobacco Use   Smoking status: Former    Types: Cigarettes    Quit date: 2005    Years since quitting: 17.6    Smokeless tobacco: Never  Vaping Use   Vaping Use: Never used  Substance Use Topics   Alcohol use: Yes    Alcohol/week: 0.0 standard drinks    Comment: every 8 days 5-8 drinks of wine and whiskey   Drug use: No    Home Medications Prior to Admission medications   Medication Sig Start Date End Date Taking? Authorizing Provider  methocarbamol (ROBAXIN) 500 MG tablet Take 2 tablets (1,000 mg total) by mouth every 8 (eight) hours as needed for muscle spasms. 11/14/20   Argentina Donovan, PA-C  Multiple Vitamin (MULTIVITAMIN) capsule Take 1 capsule by mouth daily. Patient not taking: Reported on 11/14/2020    [provider]  pantoprazole (PROTONIX) 40 MG tablet Take 1 tablet (40 mg total) by mouth every morning. Patient not taking: Reported on 11/14/2020 05/24/20   Thornton Park, MD  promethazine (PHENERGAN) 12.5 MG tablet Take 1 tablet (12.5 mg total) by mouth every 8 (eight) hours as needed for nausea or vomiting.  11/14/20   Argentina Donovan, PA-C  sulfamethoxazole-trimethoprim (BACTRIM DS) 800-160 MG tablet Take 1 tablet by mouth 2 (two) times daily. Patient not taking: Reported on 11/14/2020 09/02/20   Ladell Pier, MD    Allergies    Patient has no known allergies.  Review of Systems   Review of Systems  Musculoskeletal:  Positive for joint swelling.  All other systems reviewed and are negative.  Physical Exam Updated Vital Signs BP 132/88   Pulse (!) 55   Temp 98.7 F (37.1 C) (Oral)   Resp 17   LMP 10/08/2011   SpO2 100%   Physical Exam Vitals and nursing note reviewed.  Constitutional:      General: She is not in acute distress.    Appearance: Normal appearance.  HENT:     Head: Normocephalic and atraumatic.  Eyes:     General:        Right eye: No discharge.        Left eye: No discharge.  Cardiovascular:     Rate and Rhythm: Normal rate and regular rhythm.     Comments: Still radial and ulnar pulses in bilateral upper extremities. Pulmonary:      Effort: Pulmonary effort is normal. No respiratory distress.  Musculoskeletal:        General: Swelling present. No deformity.     Comments: Patient is able to make a tight fist with both hands, able to flex and extend wrist of both hands with limitation of range of motion secondary to pain.  Excessively limited due to pain on the left side, with a notable swollen area on the distal radial aspect of the forearm.  Patient has no obvious deformity, no step-off, no open fracture.  Skin:    General: Skin is warm and dry.     Capillary Refill: Capillary refill takes less than 2 seconds.     Comments: Intact capillary refill distal to injury of both wrists.  Neurological:     Mental Status: She is alert and oriented to person, place, and time.  Psychiatric:        Mood and Affect: Mood normal.        Behavior: Behavior normal.    ED Results / Procedures / Treatments   Labs (all labs ordered are listed, but only abnormal results are displayed) Labs Reviewed  POC URINE PREG, ED    EKG None  Radiology DG Forearm Left  Result Date: 11/17/2020 CLINICAL DATA:  Fall, pain. EXAM: LEFT FOREARM - 2 VIEW COMPARISON:  None. FINDINGS: Slightly displaced fracture of the distal LEFT radius, with involvement of the articular surface. Remainder of the LEFT radius appears intact and normally aligned. LEFT ulna appears intact and normally aligned. IMPRESSION: Slightly displaced fracture of the distal LEFT radius, with involvement of the articular surface. Electronically Signed   By: Franki Cabot M.D.   On: 11/17/2020 09:50   DG Wrist Complete Left  Result Date: 11/17/2020 CLINICAL DATA:  Fall, pain. EXAM: LEFT WRIST - COMPLETE 3+ VIEW COMPARISON:  None. FINDINGS: Slightly displaced fracture of the distal LEFT radius, with involvement of the articular surface Distal ulna appears intact and normally aligned. Carpal bones appear intact and normally aligned. IMPRESSION: Slightly displaced fracture of the  distal LEFT radius, with involvement of the articular surface. Electronically Signed   By: Franki Cabot M.D.   On: 11/17/2020 09:49   DG Wrist Complete Right  Result Date: 11/17/2020 CLINICAL DATA:  Right wrist pain after fall. EXAM: RIGHT  WRIST - COMPLETE 3+ VIEW COMPARISON:  None. FINDINGS: There is no evidence of fracture or dislocation. There is no evidence of arthropathy or other focal bone abnormality. Soft tissues are unremarkable. IMPRESSION: Negative. Electronically Signed   By: Marijo Conception M.D.   On: 11/17/2020 09:54   DG Hand Complete Left  Result Date: 11/17/2020 CLINICAL DATA:  Left hand pain after fall. EXAM: LEFT HAND - COMPLETE 3+ VIEW COMPARISON:  None. FINDINGS: Mildly displaced fracture is seen involving the distal left radial styloid. No other bony abnormality is noted in the left hand. IMPRESSION: Mildly displaced distal left radial styloid fracture with intra-articular extension. Electronically Signed   By: Marijo Conception M.D.   On: 11/17/2020 09:52   DG Hand Complete Right  Result Date: 11/17/2020 CLINICAL DATA:  Right hand pain after fall. EXAM: RIGHT HAND - COMPLETE 3+ VIEW COMPARISON:  None. FINDINGS: There is no evidence of fracture or dislocation. There is no evidence of arthropathy or other focal bone abnormality. Soft tissues are unremarkable. IMPRESSION: Negative. Electronically Signed   By: Marijo Conception M.D.   On: 11/17/2020 09:53    Procedures Procedures   Medications Ordered in ED Medications  oxyCODONE-acetaminophen (PERCOCET/ROXICET) 5-325 MG per tablet 1 tablet (1 tablet Oral Given 11/17/20 1321)    ED Course  I have reviewed the triage vital signs and the nursing notes.  Pertinent labs & imaging results that were available during my care of the patient were reviewed by me and considered in my medical decision making (see chart for details).    MDM Rules/Calculators/A&P                         Radiographic imaging reviewed which shows no bony  injury of the right wrist or hand.  No anatomic snuffbox tenderness.  No rigidity, heat, excessive pain do not favor compartment syndrome.  Radiographic imaging reveals:  Slightly displaced fracture of the distal LEFT radius, with  involvement of the articular surface.   After discussion with the patient and Dr. Dina Rich, will place consult to hand surgery and discuss.  Spoke with Dr. Marlou Sa, who requests double sugar-tong splint of left arm, and follow-up in his clinic next week. Final Clinical Impression(s) / ED Diagnoses Final diagnoses:  Other closed intra-articular fracture of distal end of left radius, initial encounter    Rx / DC Orders ED Discharge Orders     None        Anselmo Pickler, PA-C 11/17/20 1426    Horton, Alvin Critchley, DO 11/18/20 1547

## 2020-11-17 NOTE — ED Triage Notes (Signed)
Patient states that she fell last night while walking into her home. Patient fell forward, bracing herself with both hands. Patient has visible swelling in left forearm worsening pain. Rates pain a 9/10.

## 2020-11-17 NOTE — Discharge Instructions (Addendum)
Como comentamos, le colocamos una frula en el brazo izquierdo. Recomiendo alternar ibuprofeno y Tylenol cada 3 horas para Conservation officer, historic buildings. Haga un seguimiento con el Dr. Marlou Sa en ortopedia de [place]. He adjuntado la informacin de contacto a sus instrucciones de alta. Llamara a su oficina maana por la maana para programar una cita para la evaluacin y Programmer, systems del yeso.

## 2020-11-18 ENCOUNTER — Encounter: Payer: Self-pay | Admitting: Orthopedic Surgery

## 2020-11-18 ENCOUNTER — Ambulatory Visit (INDEPENDENT_AMBULATORY_CARE_PROVIDER_SITE_OTHER): Payer: Self-pay | Admitting: Orthopedic Surgery

## 2020-11-18 DIAGNOSIS — S52532A Colles' fracture of left radius, initial encounter for closed fracture: Secondary | ICD-10-CM

## 2020-11-18 NOTE — Progress Notes (Signed)
Office Visit Note   Patient: Brooke Lam           Date of Birth: May 13, 1980           MRN: [id number] Visit Date: 11/18/2020 Requested by: Ladell Pier, MD [place],  [place] [address] PCP: Ladell Pier, MD  Subjective: Chief Complaint  Patient presents with   Left Wrist - Follow-up    HPI: Brooke Lam is a 40 year old patient with left wrist pain.  Date of injury 11/16/2020.  She stepped in a hole.  She has been ambulating with a sling.  She was placed in a splint.  Taking muscle relaxer and ibuprofen for pain.  She also reports some right wrist pain.  Not as bad as the left wrist.  Denies any elbow or shoulder symptoms.  She works in Surveyor, quantity.              ROS: All systems reviewed are negative as they relate to the chief complaint within the history of present illness.  Patient denies  fevers or chills.   Assessment & Plan: Visit Diagnoses:  1. Closed Colles' fracture of left radius, initial encounter     Plan: Impression is left distal radius fracture with no evidence of injury to the scapholunate ligament on plain radiographs.  She also has right wrist pain with no snuffbox tenderness but she does have a little tenderness over the proximal pole of the scaphoid.  No definite fracture seen on radiographs of the right wrist.  Plan at this time is cast immobilization in slight radial deviation for the left wrist.  Splint immobilization and decreased range of motion and activity for the right wrist.  Clinical recheck on right wrist in a week with decision for or against further imaging to rule out scaphoid fracture.  Would recommend dedicated scaphoid view radiographs AP only right wrist on return office visit along with radiographs of the left wrist to make sure fracture alignment remains intact.  Follow-Up Instructions: Return in about 1 week (around 11/25/2020).   Orders:  No orders of the defined types were placed in this encounter.  No orders of  the defined types were placed in this encounter.     Procedures: No procedures performed   Clinical Data: No additional findings.  Objective: Vital Signs: LMP 10/08/2011   Physical Exam:   Constitutional: Patient appears well-developed HEENT:  Head: Normocephalic Eyes:EOM are normal Neck: Normal range of motion Cardiovascular: Normal rate Pulmonary/chest: Effort normal Neurologic: Patient is alert Skin: Skin is warm Psychiatric: Patient has normal mood and affect   Ortho Exam: Ortho exam of the right wrist demonstrates good grip strength with no bruising or ecchymosis noted in that wrist region.  Do not detect joint effusion.  EPL FPL interosseous function intact.  Elbow range of motion on the right is intact.  Left wrist is examined.  She does have pain and some swelling around the left wrist which is mild.  Compartments are soft.  Radial pulse intact bilaterally.  Fingers are perfused and sensate.  Specialty Comments:  No specialty comments available.  Imaging: No results found.   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   Dyspareunia 11/12/2014   S/P emergency cesarean hysterectomy 08/01/2012   Dysuria 09/17/2011   Past Medical History:  Diagnosis Date   Ectopic pregnancy    Preterm labor     Family History  Problem Relation Age  of Onset   Diabetes Father    Hypertension Father    Diabetes Paternal Grandmother    Diabetes Mother    Hypertension Mother    Diabetes Paternal Aunt    Anesthesia problems Neg Hx    Hearing loss Neg Hx    Other Neg Hx     Past Surgical History:  Procedure Laterality Date   ABDOMINAL HYSTERECTOMY     BREAST BIOPSY Right 2013   FA   CESAREAN SECTION     C/S x 4   UPPER GASTROINTESTINAL ENDOSCOPY  04/23/2020   Social History   Occupational History   Occupation: bartender  Tobacco Use   Smoking status: Former    Types: Cigarettes    Quit date: 2005     Years since quitting: 17.6   Smokeless tobacco: Never  Vaping Use   Vaping Use: Never used  Substance and Sexual Activity   Alcohol use: Yes    Alcohol/week: 0.0 standard drinks    Comment: every 8 days 5-8 drinks of wine and whiskey   Drug use: No   Sexual activity: Yes    Birth control/protection: None, Surgical

## 2020-11-20 ENCOUNTER — Other Ambulatory Visit: Payer: Self-pay

## 2020-11-20 ENCOUNTER — Ambulatory Visit (HOSPITAL_BASED_OUTPATIENT_CLINIC_OR_DEPARTMENT_OTHER)
Admission: RE | Admit: 2020-11-20 | Discharge: 2020-11-20 | Disposition: A | Discharge status: Home / Self Care | Payer: No Typology Code available for payment source | Source: Ambulatory Visit | Attending: Physician Assistant | Admitting: Physician Assistant

## 2020-11-20 DIAGNOSIS — R110 Nausea: Secondary | ICD-10-CM | POA: Insufficient documentation

## 2020-11-20 DIAGNOSIS — R519 Headache, unspecified: Secondary | ICD-10-CM

## 2020-11-21 ENCOUNTER — Ambulatory Visit: Payer: No Typology Code available for payment source | Admitting: Surgery

## 2020-11-21 ENCOUNTER — Other Ambulatory Visit: Payer: Self-pay | Admitting: Physician Assistant

## 2020-11-21 DIAGNOSIS — R110 Nausea: Secondary | ICD-10-CM

## 2020-11-21 DIAGNOSIS — R519 Headache, unspecified: Secondary | ICD-10-CM

## 2020-11-21 DIAGNOSIS — R9389 Abnormal findings on diagnostic imaging of other specified body structures: Secondary | ICD-10-CM

## 2020-11-22 ENCOUNTER — Encounter: Payer: Self-pay | Admitting: Neurology

## 2020-11-27 ENCOUNTER — Ambulatory Visit (INDEPENDENT_AMBULATORY_CARE_PROVIDER_SITE_OTHER): Payer: Self-pay

## 2020-11-27 ENCOUNTER — Ambulatory Visit (INDEPENDENT_AMBULATORY_CARE_PROVIDER_SITE_OTHER): Payer: Self-pay | Admitting: Orthopedic Surgery

## 2020-11-27 ENCOUNTER — Ambulatory Visit: Payer: Self-pay

## 2020-11-27 ENCOUNTER — Encounter: Payer: Self-pay | Admitting: Orthopedic Surgery

## 2020-11-27 DIAGNOSIS — S52532A Colles' fracture of left radius, initial encounter for closed fracture: Secondary | ICD-10-CM

## 2020-11-27 DIAGNOSIS — M25531 Pain in right wrist: Secondary | ICD-10-CM

## 2020-11-27 DIAGNOSIS — M25532 Pain in left wrist: Secondary | ICD-10-CM

## 2020-11-27 NOTE — Progress Notes (Signed)
Post-Op Visit Note   Patient: Brooke Lam           Date of Birth: [birth year]           MRN: [id number] Visit Date: 11/27/2020 PCP: Ladell Pier, MD   Assessment & Plan:  Chief Complaint:  Chief Complaint  Patient presents with   Right Wrist - Pain   Left Wrist - Fracture, Follow-up    DOI:11/16/20   Visit Diagnoses:  1. Closed Colles' fracture of left radius, initial encounter   2. Pain in right wrist     Plan: Jalyric is a 40 year old patient with left wrist radial styloid fracture.  Radiographs on that today show no change in fracture alignment.  Range of motion of the left wrist and fingers intact.  Patient also had some right wrist pain which is better than it was 10 days ago.  She has good range of motion with flexion extension some radial styloid tenderness present.  EPL FPL interosseous intact.  Radiographs of the right wrist showed no scaphoid fracture.  Plan at this time is to examine the right wrist again in approximately 2 weeks.  2-week return with Lurena Joiner on a Friday afternoon for cast removal repeat radiographs removable wrist splint and initiation of range of motion exercises for the left wrist.  If she remains symptomatic in the right wrist without improvement may need to consider MRI scanning to rule out scaphoid fracture but that looks less likely today on examination.  And radiographs.  Follow-Up Instructions: No follow-ups on file.   Orders:  Orders Placed This Encounter  Procedures   XR Wrist 2 Views Right   XR Wrist 2 Views Left   No orders of the defined types were placed in this encounter.   Imaging: XR Wrist 2 Views Left  Result Date: 11/27/2020 AP lateral radiographs left wrist reviewed.  Radial styloid fracture unchanged in appearance.  Less than a millimeter displacement on the AP and lateral views.  No widening of the scapholunate interval.  XR Wrist 2 Views Right  Result Date: 11/27/2020 AP lateral scaphoid view right wrist  reviewed.  No scaphoid or radial styloid fracture.  Scapholunate interval intact.  Scapholunate angle also intact.  No acute fracture.   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   Dyspareunia 11/12/2014   S/P emergency cesarean hysterectomy 08/01/2012   Dysuria 09/17/2011   Past Medical History:  Diagnosis Date   Ectopic pregnancy    Preterm labor     Family History  Problem Relation Age of Onset   Diabetes Father    Hypertension Father    Diabetes Paternal Grandmother    Diabetes Mother    Hypertension Mother    Diabetes Paternal Aunt    Anesthesia problems Neg Hx    Hearing loss Neg Hx    Other Neg Hx     Past Surgical History:  Procedure Laterality Date   ABDOMINAL HYSTERECTOMY     BREAST BIOPSY Right 2013   FA   CESAREAN SECTION     C/S x 4   UPPER GASTROINTESTINAL ENDOSCOPY  04/23/2020   Social History   Occupational History   Occupation: bartender  Tobacco Use   Smoking status: Former    Types: Cigarettes    Quit date: 2005    Years since quitting: 17.6   Smokeless tobacco: Never  Vaping Use   Vaping Use: Never used  Substance and Sexual  Activity   Alcohol use: Yes    Alcohol/week: 0.0 standard drinks    Comment: every 8 days 5-8 drinks of wine and whiskey   Drug use: No   Sexual activity: Yes    Birth control/protection: None, Surgical

## 2020-12-11 ENCOUNTER — Other Ambulatory Visit: Payer: Self-pay

## 2020-12-11 ENCOUNTER — Ambulatory Visit: Payer: Self-pay

## 2020-12-11 ENCOUNTER — Ambulatory Visit (INDEPENDENT_AMBULATORY_CARE_PROVIDER_SITE_OTHER): Payer: Self-pay | Admitting: Orthopedic Surgery

## 2020-12-11 ENCOUNTER — Encounter: Payer: Self-pay | Admitting: Orthopedic Surgery

## 2020-12-11 DIAGNOSIS — S52532A Colles' fracture of left radius, initial encounter for closed fracture: Secondary | ICD-10-CM

## 2020-12-11 NOTE — Progress Notes (Signed)
   Post-Op Visit Note   Patient: Brooke Lam           Date of Birth: Mar 25, 1980           MRN: [id number] Visit Date: 12/11/2020 PCP: Ladell Pier, MD   Assessment & Plan:  Chief Complaint:  Chief Complaint  Patient presents with   Left Wrist - Follow-up, Fracture    DOI: 11/16/20   Visit Diagnoses:  1. Closed Colles' fracture of left radius, initial encounter     Plan: Essynce is a 40 year old patient is 3 and half weeks out left distal radius fracture.  She has been in a cast.  Radial styloid fracture displaced only about a millimeter or so.  On exam she has good finger flexion and extension.  Radiographs show no change in fracture alignment.  Plan is to change her out from a cast to a long short arm splint with stockinettes.  Keep the splint on for 3 more weeks.  Repeat radiographs in 3 weeks and initiation of occupational therapy at that time.  Follow-Up Instructions: Return in about 3 weeks (around 01/01/2021).   Orders:  Orders Placed This Encounter  Procedures   XR Wrist 2 Views Left   No orders of the defined types were placed in this encounter.   Imaging: XR Wrist 2 Views Left  Result Date: 12/11/2020 AP lateral radiographs left wrist reviewed.  Radial styloid fracture unchanged in appearance with no further displacement.  Scapholunate angle intact.  Cast obscures fine detail such as early callus formation.   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   Dyspareunia 11/12/2014   S/P emergency cesarean hysterectomy 08/01/2012   Dysuria 09/17/2011   Past Medical History:  Diagnosis Date   Ectopic pregnancy    Preterm labor     Family History  Problem Relation Age of Onset   Diabetes Father    Hypertension Father    Diabetes Paternal Grandmother    Diabetes Mother    Hypertension Mother    Diabetes Paternal Aunt    Anesthesia problems Neg Hx    Hearing loss Neg Hx     Other Neg Hx     Past Surgical History:  Procedure Laterality Date   ABDOMINAL HYSTERECTOMY     BREAST BIOPSY Right 2013   FA   CESAREAN SECTION     C/S x 4   UPPER GASTROINTESTINAL ENDOSCOPY  04/23/2020   Social History   Occupational History   Occupation: bartender  Tobacco Use   Smoking status: Former    Types: Cigarettes    Quit date: 2005    Years since quitting: 17.7   Smokeless tobacco: Never  Vaping Use   Vaping Use: Never used  Substance and Sexual Activity   Alcohol use: Yes    Alcohol/week: 0.0 standard drinks    Comment: every 8 days 5-8 drinks of wine and whiskey   Drug use: No   Sexual activity: Yes    Birth control/protection: None, Surgical

## 2021-01-08 ENCOUNTER — Other Ambulatory Visit: Payer: Self-pay

## 2021-01-08 ENCOUNTER — Ambulatory Visit (INDEPENDENT_AMBULATORY_CARE_PROVIDER_SITE_OTHER): Payer: Self-pay | Admitting: Orthopedic Surgery

## 2021-01-08 ENCOUNTER — Ambulatory Visit (INDEPENDENT_AMBULATORY_CARE_PROVIDER_SITE_OTHER): Payer: Self-pay

## 2021-01-08 DIAGNOSIS — S52532A Colles' fracture of left radius, initial encounter for closed fracture: Secondary | ICD-10-CM

## 2021-01-12 ENCOUNTER — Encounter: Payer: Self-pay | Admitting: Orthopedic Surgery

## 2021-01-12 NOTE — Progress Notes (Signed)
   Post-Op Visit Note   Patient: Brooke Lam           Date of Birth: December 11, 1980           MRN: [id number] Visit Date: 01/08/2021 PCP: Ladell Pier, MD   Assessment & Plan:  Chief Complaint:  Chief Complaint  Patient presents with   Left Wrist - Fracture, Follow-up   Visit Diagnoses:  1. Closed Colles' fracture of left radius, initial encounter     Plan: [name] is a 40 year old patient who underwent left wrist fracture casting.  Date of injury 11/16/2020.  She has been in a splint.  It hurts her to cook.  She works as a Engineer, building services.  On exam she has reasonable grip strength and mild tenderness.  I really want her to start occupational therapy upstairs 1 time a week for 4 weeks with home exercise program for range of motion and strengthening.  I think there is been some interval healing of the fracture but not a robust as responses I would expect.  Come back in 4 weeks with repeat radiographs to assess the need for further CT scanning at that time.  I do want her to discontinue the splint full-time at this time.  No lifting more than 5 pounds but need to get more range of motion.  Range of motion currently is restricted to about 35 degrees of extension and 30 degrees of flexion.  Follow-Up Instructions: No follow-ups on file.   Orders:  Orders Placed This Encounter  Procedures   XR Wrist 2 Views Left   Ambulatory referral to Occupational Therapy   No orders of the defined types were placed in this encounter.   Imaging: No results found.  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   Dyspareunia 11/12/2014   S/P emergency cesarean hysterectomy 08/01/2012   Dysuria 09/17/2011   Past Medical History:  Diagnosis Date   Ectopic pregnancy    Preterm labor     Family History  Problem Relation Age of Onset   Diabetes Father    Hypertension Father    Diabetes Paternal Grandmother    Diabetes  Mother    Hypertension Mother    Diabetes Paternal Aunt    Anesthesia problems Neg Hx    Hearing loss Neg Hx    Other Neg Hx     Past Surgical History:  Procedure Laterality Date   ABDOMINAL HYSTERECTOMY     BREAST BIOPSY Right 2013   FA   CESAREAN SECTION     C/S x 4   UPPER GASTROINTESTINAL ENDOSCOPY  04/23/2020   Social History   Occupational History   Occupation: bartender  Tobacco Use   Smoking status: Former    Types: Cigarettes    Quit date: 2005    Years since quitting: 17.8   Smokeless tobacco: Never  Vaping Use   Vaping Use: Never used  Substance and Sexual Activity   Alcohol use: Yes    Alcohol/week: 0.0 standard drinks    Comment: every 8 days 5-8 drinks of wine and whiskey   Drug use: No   Sexual activity: Yes    Birth control/protection: None, Surgical

## 2021-01-14 ENCOUNTER — Encounter: Payer: Self-pay | Admitting: Occupational Therapy

## 2021-01-16 ENCOUNTER — Encounter: Payer: Self-pay | Admitting: Occupational Therapy

## 2021-01-21 ENCOUNTER — Other Ambulatory Visit: Payer: Self-pay

## 2021-01-21 ENCOUNTER — Ambulatory Visit (INDEPENDENT_AMBULATORY_CARE_PROVIDER_SITE_OTHER): Payer: Self-pay | Admitting: Occupational Therapy

## 2021-01-21 ENCOUNTER — Encounter: Payer: Self-pay | Admitting: Occupational Therapy

## 2021-01-21 DIAGNOSIS — M25642 Stiffness of left hand, not elsewhere classified: Secondary | ICD-10-CM

## 2021-01-21 DIAGNOSIS — M79642 Pain in left hand: Secondary | ICD-10-CM

## 2021-01-21 DIAGNOSIS — M6281 Muscle weakness (generalized): Secondary | ICD-10-CM

## 2021-01-21 DIAGNOSIS — R278 Other lack of coordination: Secondary | ICD-10-CM

## 2021-01-21 NOTE — Patient Instructions (Addendum)
Ejercicios para la [place] y Research officer, political party Wrist and Forearm Exercises Pregunte al mdico qu ejercicios son seguros para usted. Haga los ejercicios exactamente como se lo haya indicado el mdico y gradelos como se lo hayan indicado. Es normal sentir un estiramiento leve, tironeo, opresin o Tree surgeon al [employer] [place]. Detngase de inmediato si siente un dolor repentino o Printmaker. No comience a hacer estos ejercicios hasta que se lo indique el mdico. Ejercicios de amplitud de movimientos Estos ejercicios calientan los msculos y las articulaciones, y mejoran la movilidad y la flexibilidad de la mueca y el antebrazo lesionados. Estos ejercicios tambin ayudan a Best boy, el adormecimiento y el hormigueo. Estos ejercicios se hacen con los msculos de la [place] y el antebrazo lesionados. Flexin de la mueca Flexione el codo izquierdo/derecho en un ngulo de 90 grados (ngulo recto) con la palma mirando al suelo. Doble la [employer] de modo que los dedos apunten hacia el suelo (flexin). Mantenga esta posicin durante __________ segundos. Vuelva lentamente a la posicin inicial. Repita __________ veces. Realice este ejercicio __________ veces al da. Extensin de Advertising copywriter Flexione el codo izquierdo/derecho en un ngulo de 90 grados (ngulo recto) con la palma mirando al suelo. Flexione la [employer] de modo que los dedos apunten hacia el techo (extensin). Mantenga esta posicin durante __________ segundos. Vuelva lentamente a la posicin inicial. Repita __________ veces. Realice este ejercicio __________ veces al da. Desviacin cubital Flexione el codo izquierdo/derecho en un ngulo de 90 grados (ngulo recto) y apoye el antebrazo sobre una mesa, con la palma [place]. Con la mano plana sobre la mesa, doble la mueca izquierda/derecha hacia el dedo pequeo (meique). Esto es la desviacin cubital. Mantenga esta posicin durante __________ segundos. Vuelva lentamente a la  posicin inicial. Repita __________ veces. Realice este ejercicio __________ veces al da. Desviacin radial Flexione el codo izquierdo/derecho en un ngulo de 90 grados (ngulo recto) y apoye el antebrazo sobre una mesa, con la palma [place]. Con la mano plana sobre la mesa, doble la mueca izquierda/derecha Armed forces training and education officer. Esto es la desviacin radial. Mantenga esta posicin durante __________ segundos. Vuelva lentamente a la posicin inicial. Repita __________ veces. Realice este ejercicio __________ veces al da. Rotacin del antebrazo, supinacin  Sintese con el codo izquierdo/derecho flexionado en un ngulo de 90 grados (ngulo recto). Coloque el antebrazo de modo que el pulgar quede mirando hacia el techo (posicin [place]). Gire (rote) la palma Sharyne Pe[place], en direccin al techo (supinacin) y detngase cuando sienta un estiramiento suave. Mantenga esta posicin durante __________ segundos. Vuelva lentamente a la posicin inicial. Repita __________ veces. Realice este ejercicio __________ veces al da. Rotacin del antebrazo, pronacin  Sintese con el codo izquierdo/derecho flexionado en un ngulo de 90 grados (ngulo recto). Coloque el antebrazo de modo que el pulgar quede mirando hacia el techo (posicin [place]). Gire la palma Wendi Maya, en direccin al suelo (pronacin) y detngase cuando sienta un estiramiento suave. Mantenga esta posicin durante __________ segundos. Vuelva lentamente a la posicin inicial. Repita __________ veces. Realice este ejercicio __________ veces al da. Estiramiento Estos ejercicios [employer] y las articulaciones, y mejoran la movilidad y la flexibilidad de la [place] y el antebrazo lesionados. Estos ejercicios tambin ayudan a Best boy, el adormecimiento y el hormigueo. Estos ejercicios se hacen con la [employer] y el antebrazo sanos, que ayudan a Training and development officer los msculos de la [place] y el antebrazo lesionados. Flexin de la  mueca  Extienda el brazo izquierdo/derecho [employer]  adelante y gire la palma hacia abajo, en direccin al suelo. Si el mdico se lo ha indicado, flexione el codo izquierdo/derecho en un ngulo de 90 grados (ngulo recto) al costado del cuerpo. Con la mano que no est lesionada, haga presin suavemente sobre el dorso de la mano izquierda/derecha para doblar la [place] y los dedos hacia el suelo (flexin). Llegue hasta donde sienta un estiramiento sin Education officer, environmental. Mantenga esta posicin durante __________ segundos. Vuelva lentamente a la posicin inicial. Repita __________ veces. Realice este ejercicio __________ veces al da. Extensin de Advertising copywriter  Extienda el brazo izquierdo/derecho hacia adelante y gire la palma [place] arriba, en direccin al techo. Si el mdico se lo ha indicado, flexione el codo izquierdo/derecho en un ngulo de 90 grados (ngulo recto) al costado del cuerpo. Con la mano que no est lesionada, haga presin suavemente sobre la palma de la mano izquierda/derecha para doblar la [place] y los dedos hacia el suelo (extensin). Llegue hasta donde sienta un estiramiento sin Education officer, environmental. Mantenga esta posicin durante __________ segundos. Vuelva lentamente a la posicin inicial. Repita __________ veces. Realice este ejercicio __________ veces al da. Rotacin del antebrazo, supinacin Sintese con el codo izquierdo/derecho flexionado en un ngulo de 90 grados (ngulo recto). Coloque el antebrazo de modo que el pulgar quede mirando hacia el techo (posicin [place]). Gire la palma de la mano [place] arriba, en direccin al techo, lo mximo que pueda (supinacin). Luego, use la mano no lesionada para ayudar a Media planner, detngase cuando sienta un ligero estiramiento. Mantenga esta posicin durante __________ segundos. Vuelva lentamente a la posicin inicial. Repita __________ veces. Realice este ejercicio __________ veces al da. Rotacin del antebrazo, pronacin Sintese con  el codo izquierdo/derecho flexionado en un ngulo de 90 grados (ngulo recto). Coloque el antebrazo de modo que el pulgar quede mirando hacia el techo (posicin [place]). Gire la palma de la mano hacia abajo, en direccin al suelo tanto como pueda (pronacin). Luego, use la mano no lesionada para ayudar a Media planner, detngase cuando sienta un ligero estiramiento. Mantenga esta posicin durante __________ segundos. Vuelva lentamente a la posicin inicial. Repita __________ veces. Realice este ejercicio __________ veces al da.

## 2021-01-21 NOTE — Therapy (Signed)
[facility] [address] [place], [place], [address] Phone: [phone]   Fax:  [phone]  Occupational Therapy Evaluation  Patient Details  Name: Brooke Lam MRN: [id number] Date of Birth: [birth year] Referring Provider (OT): Dr Meredith Pel   Encounter Date: 01/21/2021   OT End of Session - 01/21/21 1253     Visit Number 1    Number of Visits 4    Date for OT Re-Evaluation 02/18/21   Eval + 3 additional visits   Authorization Type None - ?self pay    OT Start Time [id number]    OT Stop Time 0932    OT Time Calculation (min) 46 min    Activity Tolerance Patient tolerated treatment well    Behavior During Therapy [facility] for tasks assessed/performed             Past Medical History:  Diagnosis Date   Ectopic pregnancy    Preterm labor     Past Surgical History:  Procedure Laterality Date   ABDOMINAL HYSTERECTOMY     BREAST BIOPSY Right 2013   FA   CESAREAN SECTION     C/S x 4   UPPER GASTROINTESTINAL ENDOSCOPY  04/23/2020    There were no vitals filed for this visit.   Subjective Assessment - 01/21/21 0852     Subjective  Pt reports that she fractured her wrist on Nov 16, 2020. Pt reports that she was walking and tripped and fell on her outstretched dominant hand.    Patient is accompanied by: Interpreter    Pertinent History Non significant per pt report - Asthma    Patient Stated Goals Get left wrist to bend better    Currently in Pain? No/denies    Multiple Pain Sites No               [facility] OT Assessment - 01/21/21 0001       Assessment   Medical Diagnosis L distal radius fracture    Referring Provider (OT) Dr Meredith Pel    Onset Date/Surgical Date 11/16/20    Hand Dominance Left    Next MD Visit 02/19/21      Precautions   Precautions Other (comment)   No lifting greater than 5#     Restrictions   Weight Bearing Restrictions No      Balance Screen   Has the patient fallen in the  past 6 months Yes    How many times? 1    Has the patient had a decrease in activity level because of a fear of falling?  No    Is the patient reluctant to leave their home because of a fear of falling?  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 Family is able to assist PRN      Mobility   Mobility Status Independent  Written Expression   Dominant Hand Left      Cognition   Overall Cognitive Status Within Functional Limits for tasks assessed      Sensation   Light Touch Appears Intact   Pt reports paresthesias in left thumb.   Semmes Weinstein Monofilament Scale Normal    Stereognosis --   digits 1-5 left     Coordination   Gross Motor Movements are Fluid and Coordinated Yes    Fine Motor Movements are Fluid and Coordinated No    Other Opposition to small finger however she c/o stiffness and tighness at end range. Not able to flex her thumb to the base of her small finger      Edema   Edema Pt with Min edema noted left dorsal wrist on the radial side that fluctuates at times with increased activity.      ROM / Strength   AROM / PROM / Strength AROM;Strength      AROM   Overall AROM  Within functional limits for tasks performed    Overall AROM Comments Active left wrist forearm pronation = 70* and supination = 80*. She reports stiffness/tightness at end range.    AROM Assessment Site Wrist;Finger;Thumb    Right/Left Wrist Left    Left Wrist  Extension 52 Degrees    Left Wrist Flexion 59 Degrees    Left Wrist Radial Deviation 8 Degrees    Left Wrist Ulnar Deviation 25 Degrees    Right/Left Finger Left    Left Composite Finger Extension --   WNL   Left Composite Finger Flexion --   WNL w/ c/o pain at ulnar dorsal wrist and ECU area   Right/Left Thumb Left    Left Thumb Opposition Digit 5   w/ c/o stiffness     Strength   Overall Strength Deficits    Strength Assessment Site Hand      Left Hand AROM   L Index  MCP 0-90 --   WNL - able to make full composite fist and hook fist, left digits 1-5   L Index PIP 0-100 --   WNL   L Index DIP 0-70 --   WNL     Hand Function   Left Hand Gross Grasp Impaired    Left Hand Grip (lbs) 30.9   Right 52.2#   Left Hand Lateral Pinch --   Left = 10# vs Right = 12#   Left 3 point pinch --   8.0 left vs 12.5 right   Comment Pt reports pain in radial  and dorsal/ulnar wrist with grip and A/ROM noted             Ejercicios para la [place] y Research officer, political party Wrist and Forearm Exercises Pregunte al mdico qu ejercicios son seguros para usted. Haga los ejercicios exactamente como se lo haya indicado el mdico y gradelos como se lo hayan indicado. Es normal sentir un estiramiento leve, tironeo, opresin o Tree surgeon al [employer] [place]. Detngase de inmediato si siente un dolor repentino o Printmaker. No comience a hacer estos ejercicios hasta que se lo indique el mdico. Ejercicios de amplitud de movimientos Estos ejercicios calientan los msculos y las articulaciones, y mejoran la movilidad y la flexibilidad de la mueca y el antebrazo lesionados. Estos ejercicios tambin ayudan a Best boy, el adormecimiento y el hormigueo. Estos ejercicios se hacen con los msculos de la [place] y el antebrazo lesionados. Flexin de la mueca Flexione el codo izquierdo/derecho en un ngulo de  90 grados (ngulo recto) con la palma mirando al suelo. Doble la [employer] de modo que los dedos apunten  hacia el suelo (flexin). Mantenga esta posicin durante ____5-10______ segundos. Vuelva lentamente a la posicin inicial. Repita ____5-10______ veces. Realice este ejercicio ____6-6______ veces al da. Extensin de Advertising copywriter Flexione el codo izquierdo/derecho en un ngulo de 90 grados (ngulo recto) con la palma mirando al suelo. Flexione la [employer] de modo que los dedos apunten hacia el techo (extensin). Mantenga esta posicin durante __________ segundos. Vuelva lentamente a la posicin inicial. Repita __________ veces. Realice este ejercicio __________ veces al da. Desviacin cubital Flexione el codo izquierdo/derecho en un ngulo de 90 grados (ngulo recto) y apoye el antebrazo sobre una mesa, con la palma [place]. Con la mano plana sobre la mesa, doble la mueca izquierda/derecha hacia el dedo pequeo (meique). Esto es la desviacin cubital. Mantenga esta posicin durante __________ segundos. Vuelva lentamente a la posicin inicial. Repita __________ veces. Realice este ejercicio __________ veces al da. Desviacin radial Flexione el codo izquierdo/derecho en un ngulo de 90 grados (ngulo recto) y apoye el antebrazo sobre una mesa, con la palma [place]. Con la mano plana sobre la mesa, doble la mueca izquierda/derecha Armed forces training and education officer. Esto es la desviacin radial. Mantenga esta posicin durante __________ segundos. Vuelva lentamente a la posicin inicial. Repita __________ veces. Realice este ejercicio __________ veces al da. Rotacin del antebrazo, supinacin  Sintese con el codo izquierdo/derecho flexionado en un ngulo de 90 grados (ngulo recto). Coloque el antebrazo de modo que el pulgar quede mirando hacia el techo (posicin [place]). Gire (rote) la palma [place]ary[place], en direccin al techo (supinacin) y detngase cuando sienta un estiramiento suave. Mantenga esta posicin durante __________ segundos. Vuelva lentamente a la posicin inicial. Repita __________  veces. Realice este ejercicio __________ veces al da. Rotacin del antebrazo, pronacin  Sintese con el codo izquierdo/derecho flexionado en un ngulo de 90 grados (ngulo recto). Coloque el antebrazo de modo que el pulgar quede mirando hacia el techo (posicin [place]). Gire la palma Wendi Maya, en direccin al suelo (pronacin) y detngase cuando sienta un estiramiento suave. Mantenga esta posicin durante __________ segundos. Vuelva lentamente a la posicin inicial. Repita __________ veces. Realice este ejercicio __________ veces al da. Estiramiento Estos ejercicios [employer] y las articulaciones, y mejoran la movilidad y la flexibilidad de la [place] y el antebrazo lesionados. Estos ejercicios tambin ayudan a Best boy, el adormecimiento y el hormigueo. Estos ejercicios se hacen con la [employer] y el antebrazo sanos, que ayudan a Training and development officer los msculos de la [place] y el antebrazo lesionados. Flexin de la mueca  Extienda el brazo izquierdo/derecho hacia adelante y gire la palma hacia abajo, en direccin al suelo. Si el mdico se lo ha indicado, flexione el codo izquierdo/derecho en un ngulo de 90 grados (ngulo recto) al costado del cuerpo. Con la mano que no est lesionada, haga presin suavemente sobre el dorso de la mano izquierda/derecha para doblar la [place] y los dedos hacia el suelo (flexin). Llegue hasta donde sienta un estiramiento sin Education officer, environmental. Mantenga esta posicin durante __________ segundos. Vuelva lentamente a la posicin inicial. Repita __________ veces. Realice este ejercicio __________ veces al da. Extensin de Advertising copywriter  Extienda el brazo izquierdo/derecho hacia adelante y gire la palma [place] arriba, en direccin al techo. Si el mdico se lo ha indicado, flexione el codo izquierdo/derecho en un ngulo de 90 grados (ngulo recto) al costado del cuerpo. Con la mano que no est  lesionada, haga presin suavemente sobre la palma de la mano  izquierda/derecha para doblar la [place] y los dedos hacia el suelo (extensin). Llegue hasta donde sienta un estiramiento sin Education officer, environmental. Mantenga esta posicin durante __________ segundos. Vuelva lentamente a la posicin inicial. Repita __________ veces. Realice este ejercicio __________ veces al da. Rotacin del antebrazo, supinacin Sintese con el codo izquierdo/derecho flexionado en un ngulo de 90 grados (ngulo recto). Coloque el antebrazo de modo que el pulgar quede mirando hacia el techo (posicin [place]). Gire la palma de la mano [place] arriba, en direccin al techo, lo mximo que pueda (supinacin). Luego, use la mano no lesionada para ayudar a Media planner, detngase cuando sienta un ligero estiramiento. Mantenga esta posicin durante __________ segundos. Vuelva lentamente a la posicin inicial. Repita __________ veces. Realice este ejercicio __________ veces al da. Rotacin del antebrazo, pronacin Sintese con el codo izquierdo/derecho flexionado en un ngulo de 90 grados (ngulo recto). Coloque el antebrazo de modo que el pulgar quede mirando hacia el techo (posicin [place]). Gire la palma de la mano hacia abajo, en direccin al suelo tanto como pueda (pronacin). Luego, use la mano no lesionada para ayudar a Media planner, detngase cuando sienta un ligero estiramiento. Mantenga esta posicin durante __________ segundos. Vuelva lentamente a la posicin inicial. Repita __________ veces. Realice este ejercicio __________ veces al da.    OT Education - 01/21/21 1251     Education Details HEP left wrist and forearm.    Person(s) Educated Patient;Other (comment)   Interpreter   Methods Explanation;Demonstration;Verbal cues;Handout    Comprehension Verbalized understanding;Returned demonstration;Need further instruction              OT Short Term Goals - 01/21/21 1303       OT SHORT TERM GOAL #1   Title Pt will be Mod I initial HEP for left wrist  and forearm    Baseline verbal cues and demonstration required    Time 2    Period Weeks    Status New    Target Date 02/04/21      OT SHORT TERM GOAL #2   Title Pt will be Mod I edema control techniques lwft wrist    Baseline Verbal cues    Time 2    Period Weeks    Status New    Target Date 02/04/21               OT Long Term Goals - 01/21/21 1417       OT LONG TERM GOAL #1   Title Pt will demonstrate improved A/ROM left wrist and forearm as seen by goniometer assessment    Baseline see Eval for ROM assessment results    Time 4    Period Weeks    Status New    Target Date 02/18/21      OT LONG TERM GOAL #2   Title Pt will be Mod I updated HEP left wrist and hand    Baseline dependent    Time 4    Period Weeks    Status New    Target Date 02/18/21      OT LONG TERM GOAL #3   Title Pt will demonstrate increased grip strength left hand as seen by and increase of 5# or greater via JAMAR dynamometer assessment    Baseline see eval for details    Time 4    Period Weeks    Status New    Target Date 02/18/21  OT LONG TERM GOAL #4   Title Pt to report improvement in pain of left hand as seen by a rating of  2/10 or less during simulated light cleaning activity    Baseline see eval    Time 4    Period Weeks    Status New    Target Date 02/18/21                   Plan - 01/21/21 1255     Clinical Impression Statement Pt is a pleasant spanish speaking left hand dominant female whom sustained a left closed distal radius/colles fracture on 11/16/20. 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 She presents today per Dr Marlou Sa for evaluation and HEP instruction. Pt is currently 9 weeks and 3 days s/p wrist fracture. She reports a non-significant PMH and states that she was wearing a splint on her left wrist until 01/08/21 when Dr Marlou Sa told her to  discontinue using it and focus on ROM. She has deficits in active ROM of her left dominant wrist, hand and forearm, generalized weakness as well as fluctuations in edema at times. She was educated in an initial HEP today and she should benefit from continued out-pt hand therapy for further instruction in HEP, strengthening and functional use.    OT Occupational Profile and History Problem Focused Assessment - Including review of records relating to presenting problem    Occupational performance deficits (Please refer to evaluation for details): ADL's    Body Structure / Function / Physical Skills ADL;Strength;Dexterity;Edema;UE functional use;ROM;Coordination;FMC    Rehab Potential Good    Clinical Decision Making Limited treatment options, no task modification necessary    Comorbidities Affecting Occupational Performance: None    Modification or Assistance to Complete Evaluation  No modification of tasks or assist necessary to complete eval    OT Frequency 1x / week    OT Duration 4 weeks    OT Treatment/Interventions Self-care/ADL training;Fluidtherapy;Therapeutic activities;Ultrasound;Therapeutic exercise;Cryotherapy;Passive range of motion;Manual Therapy;Patient/family education    Plan Review and upgrade HEP as able to include gentle strengthening, consider putty, hammer stretches left UE.    Consulted and Agree with Plan of Care Patient;Other (Comment)   Interpreter            Patient will benefit from skilled therapeutic intervention in order to improve the following deficits and impairments:   Body Structure / Function / Physical Skills: ADL, Strength, Dexterity, Edema, UE functional use, ROM, Coordination, Superior       Visit Diagnosis: Pain in left hand - Plan: Ot plan of care cert/re-cert  Stiffness of left hand, not elsewhere classified - Plan: Ot plan of care cert/re-cert  Muscle weakness (generalized) - Plan: Ot plan of care cert/re-cert  Other lack of coordination - Plan:  Ot plan of care cert/re-cert    Problem List Patient Active Problem List   Diagnosis Date Noted   Fibroadenoma of breast, 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 [place], [place], [address] Phone: [phone]   Fax:  [phone]  Name: Brooke Lam MRN: [id number] Date of Birth: 01-Dec-1980

## 2021-01-23 ENCOUNTER — Other Ambulatory Visit: Payer: Self-pay

## 2021-01-23 ENCOUNTER — Encounter: Payer: Self-pay | Admitting: Physician Assistant

## 2021-01-23 ENCOUNTER — Telehealth: Payer: Self-pay | Admitting: Internal Medicine

## 2021-01-23 ENCOUNTER — Ambulatory Visit: Payer: Self-pay | Attending: Physician Assistant | Admitting: Physician Assistant

## 2021-01-23 ENCOUNTER — Other Ambulatory Visit (HOSPITAL_COMMUNITY)
Admission: RE | Admit: 2021-01-23 | Discharge: 2021-01-23 | Disposition: A | Discharge status: Home / Self Care | Payer: Self-pay | Source: Ambulatory Visit | Attending: Physician Assistant | Admitting: Physician Assistant

## 2021-01-23 VITALS — BP 117/74 | HR 73 | Ht 63.0 in | Wt 173.4 lb

## 2021-01-23 DIAGNOSIS — N898 Other specified noninflammatory disorders of vagina: Secondary | ICD-10-CM

## 2021-01-23 DIAGNOSIS — R739 Hyperglycemia, unspecified: Secondary | ICD-10-CM

## 2021-01-23 DIAGNOSIS — R6889 Other general symptoms and signs: Secondary | ICD-10-CM

## 2021-01-23 DIAGNOSIS — Z789 Other specified health status: Secondary | ICD-10-CM

## 2021-01-23 DIAGNOSIS — H11001 Unspecified pterygium of right eye: Secondary | ICD-10-CM

## 2021-01-23 MED ORDER — OLOPATADINE HCL 0.1 % OP SOLN
1.0000 [drp] | Freq: Two times a day (BID) | OPHTHALMIC | 12 refills | Status: DC
  Filled 2021-01-23: qty 5, 20d supply, fill #0

## 2021-01-23 NOTE — Progress Notes (Signed)
Patient ID: Brooke Lam, female   DOB: Aug 02, 1980, 40 y.o.   MRN: [id number]       Brooke Lam, is a 40 y.o. female  PJA:[id number]  HAL:[id number]  DOB - [birth year]  Chief Complaint  Patient presents with   Blurred Vision    Itching and burn X 2 years       Subjective:   Brooke Lam is a 40 y.o. female here today for several issues.  Eyes have been feeling dry and itchy for a couple of years.  She also feels there is something growing over her R eye.  NKI.  Wants to see an eye doctor.    Also c/o vaginal d/c on and off since 2014 after partial hyst(still has ovaries)  also vaginal itching.  No dysuria.  No pelvic pain  No problems updated.  ALLERGIES: No Known Allergies  PAST MEDICAL HISTORY: Past Medical History:  Diagnosis Date   Ectopic pregnancy    Preterm labor     MEDICATIONS AT HOME: Prior to Admission medications   Medication Sig Start Date End Date Taking? Authorizing Provider  olopatadine (PATANOL) 0.1 % ophthalmic solution Place 1 drop into both eyes 2 (two) times daily. 01/23/21  Yes Ka Flammer, Dionne Bucy, PA-C    ROS: Neg HEENT Neg resp Neg cardiac Neg GI Neg MS Neg psych Neg neuro  Objective:   Vitals:   01/23/21 1501  BP: 117/74  Pulse: 73  SpO2: 97%  Weight: 173 lb 6 oz (78.6 kg)  Height: 5\' 3"  (1.6 m)   Exam General appearance : Awake, alert, not in any distress. Speech Clear. Not toxic looking HEENT: Atraumatic and Normocephalic, pupils equally reactive to light and accomodation, pterygium encroaching over medial R eye into the iris.  Pupil spared.  No visible pterygium on L Neck: Supple, no JVD. No cervical lymphadenopathy.  Chest: Good air entry bilaterally, CTAB.  No rales/rhonchi/wheezing CVS: S1 S2 regular, no murmurs.  Extremities: B/L Lower Ext shows no edema, both legs are warm to touch Neurology: Awake alert, and oriented X 3, CN II-XII intact, Non focal Skin: No Rash  Data Review Lab  Results  Component Value Date   HGBA1C 6.0 (H) 07/06/2014    Assessment & Plan   1. Vaginal discharge Drink more water, eliminate sugar and starchy foods.  Start acidopholus or probiotics - Cervicovaginal ancillary only  2. Vaginal itching - Cervicovaginal ancillary only  3. Itchy eyes - olopatadine (PATANOL) 0.1 % ophthalmic solution; Place 1 drop into both eyes 2 (two) times daily.  Dispense: 5 mL; Refill: 12 - Ambulatory referral to Ophthalmology  4. Hyperglycemia I have had a lengthy discussion and provided education about insulin resistance and the intake of too much sugar/refined carbohydrates.  I have advised the patient to work at a goal of eliminating sugary drinks, candy, desserts, sweets, refined sugars, processed foods, and white carbohydrates.  The patient expresses understanding.   - Hemoglobin A1c  5. Pterygium of right eye - Ambulatory referral to Ophthalmology - Lipid panel  6. Language barrier [employer] "mike" interpreters used and additional time performing visit was required.     Patient have been counseled extensively about nutrition and exercise. Other issues discussed during this visit include: low cholesterol diet, weight control and daily exercise, foot care, annual eye examinations at Ophthalmology, importance of adherence with medications and regular follow-up. We also discussed long term complications of uncontrolled diabetes and hypertension.   Return in about 6 months (around 07/23/2021) for PCP  for check up.  The patient was given clear instructions to go to ER or return to medical center if symptoms don't improve, worsen or new problems develop. The patient verbalized understanding. The patient was told to call to get lab results if they haven't heard anything in the next week.      Freeman Caldron, PA-C [facility] [place], [place]   01/23/2021, 3:18 PM

## 2021-01-23 NOTE — Patient Instructions (Addendum)
Probiotics or acidopholus tablets once daily.   Drink 80-100 ounces water daily.   Decrease your sugar  and starchy foods intake

## 2021-01-24 LAB — CERVICOVAGINAL ANCILLARY ONLY
Bacterial Vaginitis (gardnerella): POSITIVE — AB
Candida Glabrata: NEGATIVE
Candida Vaginitis: NEGATIVE
Chlamydia: NEGATIVE
Comment: NEGATIVE
Comment: NEGATIVE
Comment: NEGATIVE
Comment: NEGATIVE
Comment: NEGATIVE
Comment: NORMAL
Neisseria Gonorrhea: NEGATIVE
Trichomonas: NEGATIVE

## 2021-01-24 LAB — LIPID PANEL
Chol/HDL Ratio: 5.4 ratio — ABNORMAL HIGH (ref 0.0–4.4)
Cholesterol, Total: 185 mg/dL (ref 100–199)
HDL: 34 mg/dL — ABNORMAL LOW (ref >39)
LDL Chol Calc (NIH): 86 mg/dL (ref 0–99)
Triglycerides: 400 mg/dL — ABNORMAL HIGH (ref 0–149)
VLDL Cholesterol Cal: 65 mg/dL — ABNORMAL HIGH (ref 5–40)

## 2021-01-24 LAB — HEMOGLOBIN A1C
Est. average glucose Bld gHb Est-mCnc: 126 mg/dL
Hgb A1c MFr Bld: 6 % — ABNORMAL HIGH (ref 4.8–5.6)

## 2021-01-27 ENCOUNTER — Telehealth: Payer: Self-pay | Admitting: Internal Medicine

## 2021-01-27 NOTE — Telephone Encounter (Signed)
Pt is calling concerned for lab results. Called using [phone]

## 2021-01-28 ENCOUNTER — Other Ambulatory Visit: Payer: Self-pay | Admitting: Physician Assistant

## 2021-01-28 ENCOUNTER — Encounter: Payer: Self-pay | Admitting: Occupational Therapy

## 2021-01-28 MED ORDER — METRONIDAZOLE 500 MG PO TABS: 500.0000 mg | ORAL_TABLET | Freq: Two times a day (BID) | ORAL | 0 refills | Status: DC

## 2021-01-28 MED ORDER — METFORMIN HCL 500 MG PO TABS: 500.0000 mg | ORAL_TABLET | Freq: Two times a day (BID) | ORAL | 3 refills | Status: DC

## 2021-01-28 NOTE — Telephone Encounter (Signed)
As instructed by MD/NP/PA called pt/ name and DOB verified/ made aware of results note and instructions . Verbalized understanding

## 2021-02-04 ENCOUNTER — Encounter: Payer: Self-pay | Admitting: Occupational Therapy

## 2021-02-05 ENCOUNTER — Ambulatory Visit: Payer: Self-pay | Admitting: Women's Health

## 2021-02-11 ENCOUNTER — Other Ambulatory Visit: Payer: Self-pay

## 2021-02-11 ENCOUNTER — Encounter: Payer: Self-pay | Admitting: Occupational Therapy

## 2021-02-11 ENCOUNTER — Ambulatory Visit: Payer: Self-pay

## 2021-02-11 ENCOUNTER — Telehealth: Payer: Self-pay

## 2021-02-11 ENCOUNTER — Ambulatory Visit (INDEPENDENT_AMBULATORY_CARE_PROVIDER_SITE_OTHER): Payer: Self-pay | Admitting: Occupational Therapy

## 2021-02-11 DIAGNOSIS — R278 Other lack of coordination: Secondary | ICD-10-CM

## 2021-02-11 DIAGNOSIS — M79642 Pain in left hand: Secondary | ICD-10-CM

## 2021-02-11 DIAGNOSIS — M25642 Stiffness of left hand, not elsewhere classified: Secondary | ICD-10-CM

## 2021-02-11 DIAGNOSIS — M6281 Muscle weakness (generalized): Secondary | ICD-10-CM

## 2021-02-11 DIAGNOSIS — R600 Localized edema: Secondary | ICD-10-CM

## 2021-02-11 NOTE — Telephone Encounter (Signed)
Due to language barrier, an interpreter was used. Brooke Lam with [employer] ID [phone], Reason for encounter--advice/ appointment   Left message on voicemail to return call.

## 2021-02-11 NOTE — Telephone Encounter (Signed)
Resent note as high priority due to persistent and short work week.
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
## 2021-02-11 NOTE — Telephone Encounter (Signed)
Using Spanish interpreter [facility] # [phone]. Pt dx with BV and has finished her antibiotics. Pt woke up with same sx as before: itching, white creamy foul smelling vaginal discharge. Pt c/o itching. Denies abdominal or pelvic pain.  Pt needs to know if she needs more abx. Routing to office for advice.     Reason for Disposition  [1] Vaginal odor (bad smell) AND [2] not improved > 3 days following CARE ADVICE  Answer Assessment - Initial Assessment Questions 1. SYMPTOM: "What's the main symptom you're concerned about?" (e.g., pain, itching, dryness)     Creamy white discharge, itching, foul smelling discharge 2. LOCATION: "Where is the  sx located?" (e.g., inside/outside, left/right)     vagina 3. ONSET: "When did the  infection  start?"     Reoccurred after finishing her abx 4. PAIN: "Is there any pain?" If Yes, ask: "How bad is it?" (Scale: 1-10; mild, moderate, severe)     no 5. ITCHING: "Is there any itching?" If Yes, ask: "How bad is it?" (Scale: 1-10; mild, moderate, severe)     yes 6. CAUSE: "What do you think is causing the discharge?" "Have you had the same problem before? What happened then?"     Same infection (BV) just finished a course of abx asking if she needs more abx 7. OTHER SYMPTOMS: "Do you have any other symptoms?" (e.g., fever, itching, vaginal bleeding, pain with urination, injury to genital area, vaginal foreign body)     Itching, foul smelling discharge  Protocols used: Vaginal Symptoms-A-AH

## 2021-02-11 NOTE — Patient Instructions (Addendum)
Grip Strengthening (Resistive Putty)    Comprima la masilla con todos los dedos y Counselling psychologist. Repita _10-20__ veces. Haga _1_ sesiones por da.  Lateral Pinch Strengthening (Resistive Putty)    Apriete la masilla entre el pulgar y el costado de cada uno de los dedos por turno. Repita _10___ veces. Haga ___1_ sesiones por da.  Three Jaw Nurse, adult (Resistive Putty)    Tire hacia arriba usando los dedos pulgar, ndice y Chiropractor. Repita __10__ veces. Haga __1_ sesiones por da.  Pronation / Supination (Resistive)    Sostenga un martillo de __16__ gr/kg y rote la palma hacia arriba/abajo. Mantenga el codo flexionado al costado y la mueca derecha. Repita __10__ veces. Haga __1_ sesiones por da.  Copyright  VHI. All rights reserved.
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
## 2021-02-12 NOTE — Telephone Encounter (Signed)
Ok to be seen next week if needed
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
## 2021-02-12 NOTE — Telephone Encounter (Signed)
Will follow up with patient next week.

## 2021-02-17 ENCOUNTER — Encounter: Payer: Self-pay | Admitting: Neurology

## 2021-02-17 ENCOUNTER — Ambulatory Visit (INDEPENDENT_AMBULATORY_CARE_PROVIDER_SITE_OTHER): Payer: Self-pay | Admitting: Neurology

## 2021-02-17 ENCOUNTER — Other Ambulatory Visit: Payer: Self-pay

## 2021-02-17 VITALS — BP 136/89 | HR 76 | Wt 170.0 lb

## 2021-02-17 DIAGNOSIS — R519 Headache, unspecified: Secondary | ICD-10-CM

## 2021-02-17 DIAGNOSIS — H538 Other visual disturbances: Secondary | ICD-10-CM

## 2021-02-17 DIAGNOSIS — G8929 Other chronic pain: Secondary | ICD-10-CM

## 2021-02-17 DIAGNOSIS — E236 Other disorders of pituitary gland: Secondary | ICD-10-CM

## 2021-02-17 MED ORDER — TOPIRAMATE 50 MG PO TABS: ORAL_TABLET | ORAL | 0 refills | Status: DC

## 2021-02-17 NOTE — Patient Instructions (Signed)
Start topiramate 50mg  tablet.  Take 1/2 tablet at bedtime for one week, then increase to 1 tablet at bedtime.  If headaches not improved after 6 weeks on 1 tablet at bedtime, contact me and we can increase dose. Limit use of pain relievers to no more than 2 days out of week to prevent risk of rebound or medication-overuse headache. Get an eye exam as soon as possible and have the eye doctor fax notes to me. [phone] is the fax number. Follow up in 6 months.     1. Start topiramate 50mg  tablet.  Take 1/2 tablet at bedtime for one week, then increase to 1 tablet at bedtime.  If headaches not improved after 6 weeks on 1 tablet at bedtime, contact me and we can increase dose. 2. Limit use of pain relievers to no more than 2 days out of week to prevent risk of rebound or medication-overuse headache. 3. Get an eye exam as soon as possible and have the eye doctor fax notes to me. [phone] is the fax number. 4. Follow up in 6 months.
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
## 2021-02-18 ENCOUNTER — Ambulatory Visit (INDEPENDENT_AMBULATORY_CARE_PROVIDER_SITE_OTHER): Payer: Self-pay | Admitting: Occupational Therapy

## 2021-02-18 ENCOUNTER — Telehealth: Payer: Self-pay | Admitting: Occupational Therapy

## 2021-02-18 ENCOUNTER — Encounter: Payer: Self-pay | Admitting: Occupational Therapy

## 2021-02-18 DIAGNOSIS — R600 Localized edema: Secondary | ICD-10-CM

## 2021-02-18 DIAGNOSIS — R278 Other lack of coordination: Secondary | ICD-10-CM

## 2021-02-18 DIAGNOSIS — M6281 Muscle weakness (generalized): Secondary | ICD-10-CM

## 2021-02-18 DIAGNOSIS — M25642 Stiffness of left hand, not elsewhere classified: Secondary | ICD-10-CM

## 2021-02-18 DIAGNOSIS — M79642 Pain in left hand: Secondary | ICD-10-CM

## 2021-02-18 NOTE — Telephone Encounter (Signed)
Ok should she see cben?
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
## 2021-02-19 ENCOUNTER — Ambulatory Visit (INDEPENDENT_AMBULATORY_CARE_PROVIDER_SITE_OTHER): Payer: Self-pay

## 2021-02-19 ENCOUNTER — Encounter: Payer: Self-pay | Admitting: Orthopedic Surgery

## 2021-02-19 ENCOUNTER — Ambulatory Visit (INDEPENDENT_AMBULATORY_CARE_PROVIDER_SITE_OTHER): Payer: Self-pay | Admitting: Orthopedic Surgery

## 2021-02-19 ENCOUNTER — Other Ambulatory Visit: Payer: Self-pay

## 2021-02-19 DIAGNOSIS — S52532A Colles' fracture of left radius, initial encounter for closed fracture: Secondary | ICD-10-CM

## 2021-02-19 DIAGNOSIS — M25512 Pain in left shoulder: Secondary | ICD-10-CM

## 2021-02-19 DIAGNOSIS — M79642 Pain in left hand: Secondary | ICD-10-CM
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
## 2021-02-21 ENCOUNTER — Other Ambulatory Visit: Payer: Self-pay

## 2021-02-21 ENCOUNTER — Other Ambulatory Visit (HOSPITAL_COMMUNITY)
Admission: RE | Admit: 2021-02-21 | Discharge: 2021-02-21 | Disposition: A | Discharge status: Home / Self Care | Payer: Self-pay | Source: Ambulatory Visit | Attending: Internal Medicine | Admitting: Internal Medicine

## 2021-02-21 ENCOUNTER — Ambulatory Visit: Payer: Self-pay | Attending: Internal Medicine | Admitting: Internal Medicine

## 2021-02-21 VITALS — BP 115/80 | HR 65 | Ht 64.0 in | Wt 167.4 lb

## 2021-02-21 DIAGNOSIS — N898 Other specified noninflammatory disorders of vagina: Secondary | ICD-10-CM

## 2021-02-21 DIAGNOSIS — Z23 Encounter for immunization: Secondary | ICD-10-CM

## 2021-02-21 DIAGNOSIS — L918 Other hypertrophic disorders of the skin: Secondary | ICD-10-CM

## 2021-02-21 NOTE — Progress Notes (Signed)
Patient ID: Brooke Lam, female    DOB: 1980-08-20  MRN: [id number]  CC: Vaginitis   Subjective: Brooke Lam is a 40 y.o. female who presents for UC visit.  Pt speaks english Her concerns today include:  Pt with hx of hyperTG, PreDM  Pt seen 01/22/2021 by PA for vaginal discgh and itching.  Dx with BV.  Treated with Flagyl which she completed Reports still has slight vaginal dischg that goes and comes; cream in color.  Has a fishy odor.  No vaginal itching.  Douches once a mth for vaginal odor Pt had hysterectomy in 2013  Has tags on skin on several parts of body.  Sometimes she accidentally scratches them and they fall off.  She would like to have them removed. Patient Active Problem List   Diagnosis Date Noted   Fibroadenoma of breast, right 05/11/2019   Dyspareunia 11/12/2014   S/P emergency cesarean hysterectomy 08/01/2012   Dysuria 09/17/2011     Current Outpatient Medications on File Prior to Visit  Medication Sig Dispense Refill   metFORMIN (GLUCOPHAGE) 500 MG tablet Take 1 tablet (500 mg total) by mouth 2 (two) times daily with a meal. 180 tablet 3   olopatadine (PATANOL) 0.1 % ophthalmic solution Place 1 drop into both eyes 2 (two) times daily. 5 mL 12   metroNIDAZOLE (FLAGYL) 500 MG tablet Take 1 tablet (500 mg total) by mouth 2 (two) times daily. (Patient not taking: Reported on 02/21/2021) 14 tablet 0   topiramate (TOPAMAX) 50 MG tablet Take 1/2 tablet at bedtime for one week, then increase to 1 tablet at bedtime (Patient not taking: Reported on 02/21/2021) 30 tablet 0   No current facility-administered medications on file prior to visit.    No Known Allergies  Social History   Socioeconomic History   Marital status: Legally Separated    Spouse name: Not on file   Number of children: 4   Years of education: Not on file   Highest education level: High school graduate  Occupational History   Occupation: bartender  Tobacco Use   Smoking  status: Former    Types: Cigarettes    Quit date: 2005    Years since quitting: 17.9   Smokeless tobacco: Never  Vaping Use   Vaping Use: Never used  Substance and Sexual Activity   Alcohol use: Yes    Alcohol/week: 0.0 standard drinks    Comment: every 8 days 5-8 drinks of wine and whiskey   Drug use: No   Sexual activity: Yes    Birth control/protection: None, Surgical  Other Topics Concern   Not on file  Social History Narrative   Not on file   Social Determinants of Health   Financial Resource Strain: Not on file  Food Insecurity: Not on file  Transportation Needs: No Transportation Needs   Lack of Transportation (Medical): No   Lack of Transportation (Non-Medical): No  Physical Activity: Not on file  Stress: Not on file  Social Connections: Not on file  Intimate Partner Violence: Not on file    Family History  Problem Relation Age of Onset   Diabetes Father    Hypertension Father    Diabetes Paternal Grandmother    Diabetes Mother    Hypertension Mother    Diabetes Paternal Aunt    Anesthesia problems Neg Hx    Hearing loss Neg Hx    Other Neg Hx     Past Surgical History:  Procedure Laterality Date  ABDOMINAL HYSTERECTOMY     BREAST BIOPSY Right 2013   FA   CESAREAN SECTION     C/S x 4   UPPER GASTROINTESTINAL ENDOSCOPY  04/23/2020    ROS: Review of Systems Negative except as stated above  PHYSICAL EXAM: BP 115/80   Pulse 65   Ht 5\' 4"  (1.626 m)   Wt 167 lb 6.4 oz (75.9 kg)   LMP 10/08/2011   SpO2 97%   BMI 28.73 kg/m   Physical Exam  General appearance - alert, well appearing, and in no distress Mental status - normal mood, behavior, speech, dress, motor activity, and thought processes Skin -small skin tag noted on the right side of the neck, left axilla and right upper eyelid.   CMP Latest Ref Rng & Units 11/14/2020 04/22/2020 09/20/2019  Glucose 65 - 99 mg/dL 109(H) - 117(H)  BUN 6 - 24 mg/dL 22 - 20  Creatinine 0.57 - 1.00 mg/dL  0.82 - 0.80  Sodium 134 - 144 mmol/L 143 - 141  Potassium 3.5 - 5.2 mmol/L 3.9 - 4.1  Chloride 96 - 106 mmol/L 104 - 106  CO2 20 - 29 mmol/L 23 - 26  Calcium 8.7 - 10.2 mg/dL 9.5 - 9.4  Total Protein 6.0 - 8.5 g/dL 7.4 7.7 -  Total Bilirubin 0.0 - 1.2 mg/dL <0.2 0.5 -  Alkaline Phos 44 - 121 IU/L 79 56 -  AST 0 - 40 IU/L 26 18 -  ALT 0 - 32 IU/L 10 9 -   Lipid Panel     Component Value Date/Time   CHOL 185 01/23/2021 1525   TRIG 400 (H) 01/23/2021 1525   HDL 34 (L) 01/23/2021 1525   CHOLHDL 5.4 (H) 01/23/2021 1525   LDLCALC 86 01/23/2021 1525    CBC    Component Value Date/Time   WBC 6.2 11/14/2020 1549   WBC 7.5 09/20/2019 1602   RBC 4.53 11/14/2020 1549   RBC 4.41 09/20/2019 1602   HGB 12.5 11/14/2020 1549   HCT 37.8 11/14/2020 1549   PLT 249 11/14/2020 1549   MCV 83 11/14/2020 1549   MCH 27.6 11/14/2020 1549   MCH 27.0 09/20/2019 1602   MCHC 33.1 11/14/2020 1549   MCHC 31.3 09/20/2019 1602   RDW 13.2 11/14/2020 1549   LYMPHSABS 1.7 11/14/2020 1549   MONOABS 0.2 07/06/2014 1153   EOSABS 0.1 11/14/2020 1549   BASOSABS 0.0 11/14/2020 1549    ASSESSMENT AND PLAN: 1. Vaginal discharge Advised patient to discontinue douching.  We will have her do another self swab.  If positive we can try treating with clindamycin instead of Flagyl. - Cervicovaginal ancillary only  2. Cutaneous skin tags Advised patient that these are benign and can come back when removed.  She would like to have them removed.  I have referred her to the dermatologist.  3. Need for influenza vaccination Given flu shot.    Patient was given the opportunity to ask questions.  Patient verbalized understanding of the plan and was able to repeat key elements of the plan.   Orders Placed This Encounter  Procedures   Flu Vaccine QUAD 6+ mos PF IM (Fluarix Quad PF)   Pneumococcal conjugate vaccine 20-valent (Prevnar 20)     Requested Prescriptions    No prescriptions requested or ordered in  this encounter    No follow-ups on file.  [name], MD, FACP

## 2021-02-21 NOTE — Progress Notes (Signed)
Still having bacteria in vagina after medication. Moles on neck around 5

## 2021-02-24 LAB — CERVICOVAGINAL ANCILLARY ONLY
Bacterial Vaginitis (gardnerella): NEGATIVE
Candida Glabrata: NEGATIVE
Candida Vaginitis: NEGATIVE
Chlamydia: NEGATIVE
Comment: NEGATIVE
Comment: NEGATIVE
Comment: NEGATIVE
Comment: NEGATIVE
Comment: NEGATIVE
Comment: NORMAL
Neisseria Gonorrhea: NEGATIVE
Trichomonas: NEGATIVE

## 2021-02-25 ENCOUNTER — Ambulatory Visit: Payer: Self-pay | Attending: Internal Medicine

## 2021-02-25 ENCOUNTER — Other Ambulatory Visit: Payer: Self-pay

## 2021-03-04 ENCOUNTER — Encounter: Payer: Self-pay | Admitting: Occupational Therapy

## 2021-03-06 ENCOUNTER — Encounter: Payer: Self-pay | Admitting: Occupational Therapy

## 2021-03-12 ENCOUNTER — Encounter: Payer: Self-pay | Admitting: Occupational Therapy

## 2021-03-19 ENCOUNTER — Encounter: Payer: Self-pay | Admitting: Occupational Therapy

## 2021-03-20 ENCOUNTER — Telehealth: Payer: Self-pay | Admitting: Internal Medicine

## 2021-03-20 NOTE — Telephone Encounter (Signed)
Will forward to Nora  

## 2021-03-20 NOTE — Telephone Encounter (Signed)
Copied from [place] [id number]. Topic: Referral - Status >> Mar 20, 2021 10:35 AM Yvette Rack wrote: Reason for CRM: Pt requests that the referral be sent to [facility] [place] phone# [phone] fax# [phone]

## 2021-03-24 ENCOUNTER — Ambulatory Visit (HOSPITAL_COMMUNITY)
Admission: EM | Admit: 2021-03-24 | Discharge: 2021-03-24 | Disposition: A | Discharge status: Home / Self Care | Payer: Self-pay | Attending: Internal Medicine | Admitting: Internal Medicine

## 2021-03-24 ENCOUNTER — Other Ambulatory Visit: Payer: Self-pay

## 2021-03-24 ENCOUNTER — Encounter (HOSPITAL_COMMUNITY): Payer: Self-pay | Admitting: Emergency Medicine

## 2021-03-24 DIAGNOSIS — Z113 Encounter for screening for infections with a predominantly sexual mode of transmission: Secondary | ICD-10-CM | POA: Insufficient documentation

## 2021-03-24 DIAGNOSIS — N898 Other specified noninflammatory disorders of vagina: Secondary | ICD-10-CM | POA: Insufficient documentation

## 2021-03-24 NOTE — ED Triage Notes (Signed)
Pt presents to UC with c/o vaginal itching since Friday. Pt reports she does have a "cream" color discharge. Pt reports a hx of BV.

## 2021-03-24 NOTE — ED Provider Notes (Signed)
[facility]    CSN: [id number] Arrival date & time: 03/24/21  0802      History   Chief Complaint Chief Complaint  Patient presents with   Vaginal Itching    HPI Brooke Lam is a 41 y.o. female.   Patient presents with vaginal itching and vaginal discharge that has been present for approximately 4 days.  Patient reports that the discharge is white and "creamy" in nature.  She does report that she has a history of recurrent bacterial vaginosis.  Denies any urinary burning, urinary frequency, irregular vaginal bleeding, pelvic pain, abdominal pain, fever, back pain.  Denies any known exposure to STD and has not had any unprotected sexual intercourse recently.  She does report that she has had a hysterectomy previously, and has had recurrent vaginal discharge issues since this.  Patient was seen by PCP on 01/22/2021 and tested positive for bacterial vaginosis.  She was treated with metronidazole with resolution.  She was then seen again on 02/21/2021 with recurrent symptoms, but cervicovaginal swab was negative at that time.   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 Authorizing Provider  metFORMIN (GLUCOPHAGE) 500 MG tablet Take 1 tablet  (500 mg total) by mouth 2 (two) times daily with a meal. 01/28/21   McClung, Dionne Bucy, PA-C  olopatadine (PATANOL) 0.1 % ophthalmic solution Place 1 drop into both eyes 2 (two) times daily. 01/23/21   Argentina Donovan, PA-C  topiramate (TOPAMAX) 50 MG tablet Take 1/2 tablet at bedtime for one week, then increase to 1 tablet at bedtime Patient not taking: Reported on 02/21/2021 02/17/21   Pieter Partridge, DO    Family History Family History  Problem Relation Age of Onset   Diabetes Father    Hypertension Father    Diabetes Paternal Grandmother    Diabetes Mother    Hypertension Mother    Diabetes Paternal Aunt    Anesthesia problems Neg Hx    Hearing loss Neg Hx    Other Neg Hx     Social History Social History   Tobacco Use   Smoking status: Former    Types: Cigarettes    Quit date: 2005    Years since quitting: 18.0   Smokeless tobacco: Never  Vaping Use   Vaping Use: Never used  Substance Use Topics   Alcohol use: Yes    Alcohol/week: 0.0 standard drinks    Comment:  every 8 days 5-8 drinks of wine and whiskey   Drug use: No     Allergies   Patient has no known allergies.   Review of Systems Review of Systems Per HPI  Physical Exam Triage Vital Signs ED Triage Vitals  Enc Vitals Group     BP 03/24/21 0810 (!) 142/86     Pulse Rate 03/24/21 0810 74     Resp 03/24/21 0810 16     Temp 03/24/21 0810 98.2 F (36.8 C)     Temp Source 03/24/21 0810 Oral     SpO2 03/24/21 0810 98 %     Weight --      Height 03/24/21 0811 5\' 4"  (1.626 m)     Head Circumference --      Peak Flow --      Pain Score 03/24/21 0811 0     Pain Loc --      Pain Edu? --      Excl. in [place]? --    No data found.  Updated Vital Signs BP (!) 142/86 (BP Location: Left Arm)    Pulse 74    Temp 98.2 F (36.8 C) (Oral)    Resp 16    Ht 5\' 4"  (1.626 m)    LMP 10/08/2011    SpO2 98%    BMI 28.73 kg/m   Visual Acuity Right Eye Distance:   Left Eye Distance:   Bilateral Distance:    Right  Eye Near:   Left Eye Near:    Bilateral Near:     Physical Exam Constitutional:      General: She is not in acute distress.    Appearance: Normal appearance. She is not toxic-appearing or diaphoretic.  HENT:     Head: Normocephalic and atraumatic.  Eyes:     Extraocular Movements: Extraocular movements intact.     Conjunctiva/sclera: Conjunctivae normal.  Cardiovascular:     Rate and Rhythm: Normal rate and regular rhythm.     Pulses: Normal pulses.     Heart sounds: Normal heart sounds.  Pulmonary:     Effort: Pulmonary effort is normal. No respiratory distress.     Breath sounds: Normal breath sounds.  Abdominal:     General: Bowel sounds are normal. There is no distension.     Palpations: Abdomen is soft.     Tenderness: There is no abdominal tenderness.  Genitourinary:    Comments: Deferred with shared decision making.  Self swab performed. Neurological:     General: No focal deficit present.     Mental Status: She is alert and oriented to person, place, and time. Mental status is at baseline.  Psychiatric:        Mood and Affect: Mood normal.        Behavior: Behavior normal.        Thought Content: Thought content normal.        Judgment: Judgment normal.     UC Treatments / Results  Labs (all labs ordered are listed, but only abnormal results are displayed) Labs Reviewed  CERVICOVAGINAL ANCILLARY ONLY    EKG   Radiology No results found.  Procedures Procedures (including critical care time)  Medications Ordered in UC Medications - No data to display  Initial Impression / Assessment and Plan / UC Course  I have reviewed the triage vital signs and the nursing notes.  Pertinent labs & imaging results that were available during my care of the patient were reviewed by me and considered in  my medical decision making (see chart for details).     Patient has had recurrent issues with vaginal discharge and itching for multiple years per patient and per  review of patient's chart since hysterectomy was completed.  Patient has tested positive for bacterial vaginosis at times but has also had negative tests.  Cervicovaginal swab is pending.  Will await cervicovaginal result before treatment given patient's intermittent history of bacterial vaginosis.  Patient advised to refrain from sexual activity until test results and treatment are complete.  Do not think that urinalysis is necessary given no urinary symptoms on exam.  Patient also advised that she needs to follow-up with gynecologist given her current symptoms for further evaluation and management.  Provided patient with contact information for gynecology.  Patient voiced understanding and was agreeable with plan. Final Clinical Impressions(s) / UC Diagnoses   Final diagnoses:  Vaginal discharge  Vaginal itching  Screening examination for venereal disease     Discharge Instructions      Your vaginal swab is pending.  We will call with results and treat as appropriate.  Please refrain from sexual activity until test results and treatment are complete.  Please follow-up with provided contact information for gynecology for further evaluation and management given your current issue.    ED Prescriptions   None    PDMP not reviewed this encounter.   Teodora Medici, [place] 03/24/21 [phone]

## 2021-03-24 NOTE — Discharge Instructions (Signed)
Your vaginal swab is pending.  We will call with results and treat as appropriate.  Please refrain from sexual activity until test results and treatment are complete.  Please follow-up with provided contact information for gynecology for further evaluation and management given your current issue.

## 2021-03-25 ENCOUNTER — Ambulatory Visit: Payer: Self-pay | Admitting: *Deleted

## 2021-03-25 LAB — CERVICOVAGINAL ANCILLARY ONLY
Bacterial Vaginitis (gardnerella): POSITIVE — AB
Candida Glabrata: NEGATIVE
Candida Vaginitis: POSITIVE — AB
Chlamydia: NEGATIVE
Comment: NEGATIVE
Comment: NEGATIVE
Comment: NEGATIVE
Comment: NEGATIVE
Comment: NEGATIVE
Comment: NORMAL
Neisseria Gonorrhea: NEGATIVE
Trichomonas: NEGATIVE

## 2021-03-25 MED ORDER — METRONIDAZOLE 500 MG PO TABS: 500.0000 mg | ORAL_TABLET | Freq: Two times a day (BID) | ORAL | 0 refills | Status: DC

## 2021-03-25 MED ORDER — FLUCONAZOLE 150 MG PO TABS: 150.0000 mg | ORAL_TABLET | Freq: Every day | ORAL | 0 refills | Status: DC

## 2021-03-25 NOTE — Telephone Encounter (Signed)
Reason for Disposition  [1] Pain or burning with passing urine AND [2] side (flank) or back pain present  Answer Assessment - Initial Assessment Questions 1. COLOR of URINE: "Describe the color of the urine."  (e.g., tea-colored, pink, red, blood clots, bloody)     Red with small clots 2. ONSET: "When did the bleeding start?"      2 days 3. EPISODES: "How many times has there been blood in the urine?" or "How many times today?"     2 times- every time today 4. PAIN with URINATION: "Is there any pain with passing your urine?" If Yes, ask: "How bad is the pain?"  (Scale 1-10; or mild, moderate, severe)    - MILD - complains slightly about urination hurting    - MODERATE - interferes with normal activities      - SEVERE - excruciating, unwilling or unable to urinate because of the pain      Yes-mild/moderate 5. FEVER: "Do you have a fever?" If Yes, ask: "What is your temperature, how was it measured, and when did it start?"     no 6. ASSOCIATED SYMPTOMS: "Are you passing urine more frequently than usual?"     yes 7. OTHER SYMPTOMS: "Do you have any other symptoms?" (e.g., back/flank pain, abdominal pain, vomiting)     Abdominal pain 8. PREGNANCY: "Is there any chance you are pregnant?" "When was your last menstrual period?"     na  Protocols used: Urine - Blood In-A-AH

## 2021-03-25 NOTE — Telephone Encounter (Signed)
Pt has been scheduled a virtual appt with Tonya for 03/25/21. Will forward to provider

## 2021-03-25 NOTE — Telephone Encounter (Signed)
°  Chief Complaint: blood in urine Symptoms: pain with urination, abdominal pain, blood in urine Frequency: 2 days Pertinent Negatives: Patient denies fever Disposition: [] ED /[x] Urgent Care (no appt availability in office) / [] Appointment(In office/virtual)/ []  [facility]/ [] Home Care/ [] Refused Recommended Disposition /[] [place] [facility]/ []  Follow-up with PCP Additional Notes:

## 2021-03-26 ENCOUNTER — Ambulatory Visit: Payer: Self-pay | Attending: Internal Medicine

## 2021-03-26 ENCOUNTER — Other Ambulatory Visit: Payer: Self-pay

## 2021-03-26 ENCOUNTER — Telehealth (INDEPENDENT_AMBULATORY_CARE_PROVIDER_SITE_OTHER): Payer: Self-pay | Admitting: Nurse Practitioner

## 2021-03-26 ENCOUNTER — Encounter: Payer: Self-pay | Admitting: Nurse Practitioner

## 2021-03-26 ENCOUNTER — Encounter: Payer: Self-pay | Admitting: Occupational Therapy

## 2021-03-26 DIAGNOSIS — B9689 Other specified bacterial agents as the cause of diseases classified elsewhere: Secondary | ICD-10-CM

## 2021-03-26 DIAGNOSIS — N760 Acute vaginitis: Secondary | ICD-10-CM

## 2021-03-26 DIAGNOSIS — R319 Hematuria, unspecified: Secondary | ICD-10-CM

## 2021-03-26 NOTE — Patient Instructions (Signed)
Bacterial vaginitis Yeast infection Hematuria:  Continue current medications  Will order UA  Will place referral to OB/GYN  Follow up:  Follow up if needed

## 2021-03-26 NOTE — Progress Notes (Signed)
Virtual Visit via Telephone Note  I connected with Brooke Lam on 03/26/21 at  1:00 PM EST by telephone and verified that I am speaking with the correct person using two identifiers.  Location: Patient: home Provider: office   I discussed the limitations, risks, security and privacy concerns of performing an evaluation and management service by telephone and the availability of in person appointments. I also discussed with the patient that there may be a patient responsible charge related to this service. The patient expressed understanding and agreed to proceed.   History of Present Illness:  Patient presents today for hematuria.  This is a telephone visit using [place] interpreter.  Patient was seen in the ED 2 days ago for bacterial vaginitis and yeast infection.  She was treated with Flagyl and Diflucan.  She is currently taking these medications.  She states that since this time she has noticed that she has had blood in her urine.  She would like to get a UA done today.  We discussed that she can come to primary [facility] have a [facility]inalysis checked.  Patient is also requesting a referral to OB/GYN for frequent bacterial infections. Denies f/c/s, n/v/d, hemoptysis, PND, chest pain or edema.     Observations/Objective:  Vitals with BMI 03/24/2021 02/21/2021 02/17/2021  Height 5\' 4"  5\' 4"  -  Weight - 167 lbs 6 oz 170 lbs  BMI - 12.75 -  Systolic 170 017 494  Diastolic 86 80 89  Pulse 74 65 76      Assessment and Plan:  Bacterial vaginitis Yeast infection Hematuria:  Continue current medications  Will order UA  Will place referral to OB/GYN  Follow up:  Follow up if needed    I discussed the assessment and treatment plan with the patient. The patient was provided an opportunity to ask questions and all were answered. The patient agreed with the plan and demonstrated an understanding of the instructions.    The patient was advised to call back or seek an in-person evaluation if the symptoms worsen or if the condition fails to improve as anticipated.  I provided 23 minutes of non-face-to-face time during this encounter.   Fenton Foy, NP

## 2021-03-27 ENCOUNTER — Ambulatory Visit: Payer: Self-pay | Admitting: Family Medicine

## 2021-03-27 LAB — URINALYSIS
Bilirubin, UA: NEGATIVE
Glucose, UA: NEGATIVE
Ketones, UA: NEGATIVE
Nitrite, UA: NEGATIVE
Protein,UA: NEGATIVE
RBC, UA: NEGATIVE
Specific Gravity, UA: 1.009 (ref 1.005–1.030)
Urobilinogen, Ur: 0.2 mg/dL (ref 0.2–1.0)
pH, UA: 6.5 (ref 5.0–7.5)

## 2021-03-28 ENCOUNTER — Other Ambulatory Visit: Payer: Self-pay | Admitting: Nurse Practitioner

## 2021-03-28 LAB — URINE CULTURE

## 2021-03-28 MED ORDER — AMOXICILLIN-POT CLAVULANATE 875-125 MG PO TABS: 1.0000 | ORAL_TABLET | Freq: Two times a day (BID) | ORAL | 0 refills | Status: AC

## 2021-04-01 ENCOUNTER — Ambulatory Visit: Payer: Self-pay | Admitting: *Deleted

## 2021-04-01 NOTE — Telephone Encounter (Signed)
Patient referral was put in in November and she has not heard anything- wants referral follow up. Apologized for no call back from office- will send note for follow up.

## 2021-04-01 NOTE — Telephone Encounter (Signed)
Reason for Disposition  [1] Blurred vision or visual changes AND [2] gradual onset (e.g., weeks, months)  Answer Assessment - Initial Assessment Questions 1. DESCRIPTION: "What is the vision loss like? Describe it for me." (e.g., complete vision loss, blurred vision, double vision, floaters, etc.)     Blurred vision- itching, fog in eyes 2. LOCATION: "One or both eyes?" If one, ask: "Which eye?"     Mostly one eye-R- but sometimes both 3. SEVERITY: "Can you see anything?" If Yes, ask: "What can you see?" (e.g., fine print)     Not sure if worse- just itching 4. ONSET: "When did this begin?" "Did it start suddenly or has this been gradual?"     Patient was seen and referral given in November Referral states no open appointment until summer- patient is wanting to be seen and is frustrated that a referral has not been scheduled. Patient advised she will hear back from office regarding this referral- preferred with 24 hours.Verified contact number is correct. Patient states she has been approved for financial assistance for this.  Protocols used: Vision Loss or [id number]

## 2021-04-02 ENCOUNTER — Telehealth: Payer: Self-pay | Admitting: Internal Medicine

## 2021-04-02 DIAGNOSIS — K068 Other specified disorders of gingiva and edentulous alveolar ridge: Secondary | ICD-10-CM

## 2021-04-02 DIAGNOSIS — Z0120 Encounter for dental examination and cleaning without abnormal findings: Secondary | ICD-10-CM

## 2021-04-02 NOTE — Telephone Encounter (Signed)
Referral Request - Has patient seen PCP for this complaint? no *If NO, is insurance requiring patient see PCP for this issue before PCP can refer them? Referral for which specialty:dentist Preferred provider/office:N/A Reason for referral:teeth bleeds when brushing

## 2021-04-02 NOTE — Telephone Encounter (Signed)
Copied from [place] [phone]. Topic: Referral - Status >> Apr 01, 2021  4:20 PM Alanda Slim E wrote: Reason for CRM: Pt called about eye Dr. referral / pt has 100% cone have assistance /pt asked if another referral for another location be place asap due to blurred vision and eye irritation Pt was advised about first referral not having appt until the summer / please advise

## 2021-04-08 ENCOUNTER — Emergency Department (HOSPITAL_COMMUNITY)
Admission: EM | Admit: 2021-04-08 | Discharge: 2021-04-09 | Disposition: A | Discharge status: Home / Self Care | Payer: Self-pay | Attending: Emergency Medicine | Admitting: Emergency Medicine

## 2021-04-08 ENCOUNTER — Emergency Department (HOSPITAL_COMMUNITY): Payer: Self-pay

## 2021-04-08 ENCOUNTER — Encounter (HOSPITAL_COMMUNITY): Payer: Self-pay | Admitting: Emergency Medicine

## 2021-04-08 ENCOUNTER — Other Ambulatory Visit: Payer: Self-pay

## 2021-04-08 DIAGNOSIS — R059 Cough, unspecified: Secondary | ICD-10-CM | POA: Insufficient documentation

## 2021-04-08 DIAGNOSIS — Z20822 Contact with and (suspected) exposure to covid-19: Secondary | ICD-10-CM | POA: Insufficient documentation

## 2021-04-08 DIAGNOSIS — R5383 Other fatigue: Secondary | ICD-10-CM | POA: Insufficient documentation

## 2021-04-08 DIAGNOSIS — R0981 Nasal congestion: Secondary | ICD-10-CM | POA: Insufficient documentation

## 2021-04-08 DIAGNOSIS — R072 Precordial pain: Secondary | ICD-10-CM | POA: Insufficient documentation

## 2021-04-08 DIAGNOSIS — R519 Headache, unspecified: Secondary | ICD-10-CM | POA: Insufficient documentation

## 2021-04-08 HISTORY — DX: Essential (primary) hypertension: I10

## 2021-04-08 LAB — BASIC METABOLIC PANEL
Anion gap: 10 (ref 5–15)
BUN: 15 mg/dL (ref 6–20)
CO2: 28 mmol/L (ref 22–32)
Calcium: 9.8 mg/dL (ref 8.9–10.3)
Chloride: 100 mmol/L (ref 98–111)
Creatinine, Ser: 0.66 mg/dL (ref 0.44–1.00)
GFR, Estimated: >60 mL/min (ref >60)
Glucose, Bld: 111 mg/dL — ABNORMAL HIGH (ref 70–99)
Potassium: 3.8 mmol/L (ref 3.5–5.1)
Sodium: 138 mmol/L (ref 135–145)

## 2021-04-08 LAB — CBC
HCT: 39.3 % (ref 36.0–46.0)
Hemoglobin: 12.4 g/dL (ref 12.0–15.0)
MCH: 26.4 pg (ref 26.0–34.0)
MCHC: 31.6 g/dL (ref 30.0–36.0)
MCV: 83.6 fL (ref 80.0–100.0)
Platelets: 276 10*3/uL (ref 150–400)
RBC: 4.7 MIL/uL (ref 3.87–5.11)
RDW: 13.2 % (ref 11.5–15.5)
WBC: 8.5 10*3/uL (ref 4.0–10.5)
nRBC: 0 % (ref 0.0–0.2)

## 2021-04-08 LAB — I-STAT BETA HCG BLOOD, ED (MC, WL, AP ONLY): I-stat hCG, quantitative: <5 m[IU]/mL (ref <5)

## 2021-04-08 LAB — TROPONIN I (HIGH SENSITIVITY): Troponin I (High Sensitivity): 2 ng/L (ref <18)

## 2021-04-08 NOTE — ED Provider Triage Note (Signed)
Emergency Medicine Provider Triage Evaluation Note  Brooke Lam , a 41 y.o. female  was evaluated in triage.  Pt complains of headache and chest pain for 3 days.  Patient states that her headache feels like pressure in the left side of her head, she is also been more tired and felt nauseous throughout the day.  She reports a recent death of a family member, this made her more conscious of her health.  She also states she has history of prediabetes, and has had high blood pressure but has not been started on medication for this.  Review of Systems  Positive: Chest pain, headache, neck pain, fatigue, nausea Negative: Fever, chills, cough, vomiting, shortness of breath  Physical Exam  BP (!) 144/95 (BP Location: Right Arm)    Pulse 74    Temp 98.3 F (36.8 C) (Oral)    Resp 15    Ht 5\' 4"  (1.626 m)    Wt 77.1 kg    LMP 10/08/2011    SpO2 100%    BMI 29.18 kg/m  Gen:   Awake, no distress   Resp:  Normal effort  MSK:   Moves extremities without difficulty  Other:    Medical Decision Making  Medically screening exam initiated at 9:46 PM.  Appropriate orders placed.  Brooke Lam was informed that the remainder of the evaluation will be completed by another provider, this initial triage assessment does not replace that evaluation, and the importance of remaining in the ED until their evaluation is complete.     Kateri Plummer, Hershal Coria 04/08/21 2147

## 2021-04-08 NOTE — ED Triage Notes (Signed)
Pt reported to ED with c/o CP and pressure, headache, neck pain, excessive sleepiness and nausea throughout the day. Reports recent life death of family member that has made her more conscious of her health. Also has hx of pre-diabetes.

## 2021-04-09 ENCOUNTER — Encounter (HOSPITAL_COMMUNITY): Payer: Self-pay | Admitting: Emergency Medicine

## 2021-04-09 LAB — RESP PANEL BY RT-PCR (FLU A&B, COVID) ARPGX2
Influenza A by PCR: NEGATIVE
Influenza B by PCR: NEGATIVE
SARS Coronavirus 2 by RT PCR: NEGATIVE

## 2021-04-09 LAB — TROPONIN I (HIGH SENSITIVITY): Troponin I (High Sensitivity): 3 ng/L (ref <18)

## 2021-04-09 MED ORDER — ALUM & MAG HYDROXIDE-SIMETH 200-200-20 MG/5ML PO SUSP
30.0000 mL | Freq: Once | ORAL | Status: AC
  Administered 2021-04-09: 30 mL via ORAL
  Filled 2021-04-09: qty 30

## 2021-04-09 MED ORDER — ACETAMINOPHEN 500 MG PO TABS
1000.0000 mg | ORAL_TABLET | Freq: Once | ORAL | Status: AC
Start: 2021-04-09 — End: 2021-04-09
  Administered 2021-04-09: 1000 mg via ORAL
  Filled 2021-04-09: qty 2

## 2021-04-09 MED ORDER — IBUPROFEN 800 MG PO TABS
800.0000 mg | ORAL_TABLET | Freq: Once | ORAL | Status: AC
  Administered 2021-04-09: 800 mg via ORAL
  Filled 2021-04-09: qty 1

## 2021-04-09 NOTE — ED Provider Notes (Signed)
[facility] EMERGENCY DEPARTMENT Provider Note   CSN: [id number] Arrival date & time: 04/08/21  1905     History  Chief Complaint  Patient presents with   Chest Pain   Headache    Brooke Lam is a 41 y.o. female.  The history is provided by the patient.  Chest Pain Pain location:  Substernal area Pain quality: dull   Pain radiates to:  Does not radiate Pain severity:  Moderate Onset quality:  Gradual Duration:  36 hours Timing:  Constant Progression:  Unchanged Chronicity:  New Context: at rest   Relieved by:  Nothing Worsened by:  Nothing Ineffective treatments:  None tried Associated symptoms: cough, fatigue and headache   Associated symptoms: no abdominal pain, no fever, no palpitations, no PND, no shortness of breath, no syncope, no vomiting and no weakness   Associated symptoms comment:  Congestion Headache Associated symptoms: congestion, cough and fatigue   Associated symptoms: no abdominal pain, no fever, no neck pain, no neck stiffness, no photophobia, no syncope, no vomiting and no weakness   Patient with cough, congestion, HA and chest pain.  No OCP, no long car trips or plan trips.      Home Medications Prior to Admission medications   Medication Sig Start Date End Date Taking? Authorizing Provider  fluconazole (DIFLUCAN) 150 MG tablet Take 1 tablet (150 mg total) by mouth daily. 03/25/21   Chase Picket, MD  metFORMIN (GLUCOPHAGE) 500 MG tablet Take 1 tablet (500 mg total) by mouth 2 (two) times daily with a meal. 01/28/21   McClung, Dionne Bucy, PA-C  olopatadine (PATANOL) 0.1 % ophthalmic solution Place 1 drop into both eyes 2 (two) times daily. 01/23/21   Argentina Donovan, PA-C  topiramate (TOPAMAX) 50 MG tablet Take 1/2 tablet at bedtime for one week, then increase to 1 tablet at bedtime Patient not taking: Reported on 02/21/2021 02/17/21   Pieter Partridge, DO      Allergies    Patient has no known allergies.    Review  of Systems   Review of Systems  Constitutional:  Positive for fatigue. Negative for fever.  HENT:  Positive for congestion.   Eyes:  Negative for photophobia.  Respiratory:  Positive for cough. Negative for shortness of breath.   Cardiovascular:  Positive for chest pain. Negative for palpitations, leg swelling, syncope and PND.  Gastrointestinal:  Negative for abdominal pain and vomiting.  Genitourinary:  Negative for difficulty urinating.  Musculoskeletal:  Negative for neck pain and neck stiffness.  Skin:  Negative for rash.  Neurological:  Positive for headaches. Negative for facial asymmetry and weakness.  Psychiatric/Behavioral:  Negative for agitation.   All other systems reviewed and are negative.  Physical Exam Updated Vital Signs BP 123/84    Pulse 74    Temp 98.3 F (36.8 C) (Oral)    Resp 19    Ht 5\' 4"  (1.626 m)    Wt 77.1 kg    LMP 10/08/2011    SpO2 100%    BMI 29.18 kg/m  Physical Exam Vitals and nursing note reviewed.  Constitutional:      General: She is not in acute distress.    Appearance: Normal appearance.  HENT:     Head: Normocephalic and atraumatic.     Nose: Nose normal.  Eyes:     Conjunctiva/sclera: Conjunctivae normal.     Pupils: Pupils are equal, round, and reactive to light.  Cardiovascular:     Rate and  Rhythm: Normal rate and regular rhythm.     Pulses: Normal pulses.     Heart sounds: Normal heart sounds.  Pulmonary:     Effort: Pulmonary effort is normal.     Breath sounds: Normal breath sounds.  Abdominal:     General: Abdomen is flat. Bowel sounds are normal.     Palpations: Abdomen is soft.     Tenderness: There is no abdominal tenderness. There is no guarding.  Musculoskeletal:        General: Normal range of motion.     Cervical back: Normal range of motion and neck supple.     Right lower leg: No edema.     Left lower leg: No edema.  Skin:    General: Skin is warm and dry.     Capillary Refill: Capillary refill takes less than  2 seconds.  Neurological:     General: No focal deficit present.     Mental Status: She is alert and oriented to person, place, and time.     Deep Tendon Reflexes: Reflexes normal.  Psychiatric:        Mood and Affect: Mood normal.        Behavior: Behavior normal.    ED Results / Procedures / Treatments   Labs (all labs ordered are listed, but only abnormal results are displayed) Labs Reviewed  BASIC METABOLIC PANEL - Abnormal; Notable for the following components:      Result Value   Glucose, Bld 111 (*)    All other components within normal limits  RESP PANEL BY RT-PCR (FLU A&B, COVID) ARPGX2  CBC  I-STAT BETA HCG BLOOD, ED ([facility], [facility], [facility] ONLY)  TROPONIN I (HIGH SENSITIVITY)  TROPONIN I (HIGH SENSITIVITY)    EKG EKG Interpretation  Date/Time:  Tuesday April 08 2021 21:54:57 EST Ventricular Rate:  68 PR Interval:  116 QRS Duration: 76 QT Interval:  390 QTC Calculation: 414 R Axis:   70 Text Interpretation: Normal sinus rhythm Confirmed by Randal Buba, Casha Estupinan ([id number]) on 04/09/2021 4:15:08 AM  Radiology DG Chest 2 View  Result Date: 04/08/2021 CLINICAL DATA:  cp EXAM: CHEST - 2 VIEW COMPARISON:  CT chest 03/02/2018, chest x-ray 09/20/2019 FINDINGS: The heart and mediastinal contours are within normal limits. No focal consolidation. No pulmonary edema. No pleural effusion. No pneumothorax. No acute osseous abnormality. IMPRESSION: No active cardiopulmonary disease. Electronically Signed   By: Iven Finn M.D.   On: 04/08/2021 22:03    Procedures Procedures    Medications Ordered in ED Medications  acetaminophen (TYLENOL) tablet 1,000 mg (1,000 mg Oral Given 04/09/21 0449)  ibuprofen (ADVIL) tablet 800 mg (800 mg Oral Given 04/09/21 0449)  alum & mag hydroxide-simeth (MAALOX/MYLANTA) 200-200-20 MG/5ML suspension 30 mL (30 mLs Oral Given 04/09/21 0449)    ED Course/ Medical Decision Making/ A&P                           Medical Decision Making Problems  Addressed: Person under investigation for COVID-19: acute illness or injury    Details: test is not back but will appear in my chart given the constellation of symptoms I suspec this is likely the problem  Amount and/or Complexity of Data Reviewed Labs: ordered. Decision-making details documented in ED Course.    Details: 2 negative troponins and normal EKG, HEARt score is 1 very low risk for MACE.  I do not believe this is cardiac given other symptoms Radiology: ordered and independent interpretation  performed. Decision-making details documented in ED Course.    Details: nornal CXR ECG/medicine tests: ordered. Decision-making details documented in ED Course.  Risk OTC drugs. Prescription drug management. Risk Details: PERC negative wells 0 highly doubt PE in this low risk patient.    Phala Schraeder was evaluated in Emergency Department on 04/09/2021 for the symptoms described in the history of present illness. She was evaluated in the context of the global COVID-19 pandemic, which necessitated consideration that the patient might be at risk for infection with the SARS-CoV-2 virus that causes COVID-19. Institutional protocols and algorithms that pertain to the evaluation of patients at risk for COVID-19 are in a state of rapid change based on information released by regulatory bodies including the CDC and federal and state organizations. These policies and algorithms were followed during the patient's care in the ED.   Final Clinical Impression(s) / ED Diagnoses Final diagnoses:  Person under investigation for COVID-19  Precordial pain   Return for intractable cough, coughing up blood, fevers > 100.4 unrelieved by medication, shortness of breath, intractable vomiting, chest pain, shortness of breath, weakness, numbness, changes in speech, facial asymmetry, abdominal pain, passing out, Inability to tolerate liquids or food, cough, altered mental status or any concerns. No signs of  systemic illness or infection. The patient is nontoxic-appearing on exam and vital signs are within normal limits.  I have reviewed the triage vital signs and the nursing notes. Pertinent labs & imaging results that were available during my care of the patient were reviewed by me and considered in my medical decision making (see chart for details). After history, exam, and medical workup I feel the patient has been appropriately medically screened and is safe for discharge home. Pertinent diagnoses were discussed with the patient. Patient was given return precautions.      Rx / DC Orders ED Discharge Orders     None         Preeti Winegardner, MD 04/09/21 [phone]

## 2021-04-16 ENCOUNTER — Ambulatory Visit: Payer: Self-pay | Admitting: Physician Assistant

## 2021-04-17 ENCOUNTER — Other Ambulatory Visit: Payer: Self-pay

## 2021-04-17 ENCOUNTER — Ambulatory Visit (HOSPITAL_COMMUNITY)
Admission: EM | Admit: 2021-04-17 | Discharge: 2021-04-17 | Disposition: A | Discharge status: Home / Self Care | Payer: Self-pay | Attending: Family Medicine | Admitting: Family Medicine

## 2021-04-17 ENCOUNTER — Encounter (HOSPITAL_COMMUNITY): Payer: Self-pay | Admitting: Emergency Medicine

## 2021-04-17 ENCOUNTER — Ambulatory Visit: Payer: Self-pay

## 2021-04-17 DIAGNOSIS — R3915 Urgency of urination: Secondary | ICD-10-CM | POA: Insufficient documentation

## 2021-04-17 DIAGNOSIS — N898 Other specified noninflammatory disorders of vagina: Secondary | ICD-10-CM | POA: Insufficient documentation

## 2021-04-17 DIAGNOSIS — R109 Unspecified abdominal pain: Secondary | ICD-10-CM | POA: Insufficient documentation

## 2021-04-17 LAB — POCT URINALYSIS DIPSTICK, ED / UC
Bilirubin Urine: NEGATIVE
Glucose, UA: NEGATIVE mg/dL
Ketones, ur: NEGATIVE mg/dL
Leukocytes,Ua: NEGATIVE
Nitrite: NEGATIVE
Protein, ur: NEGATIVE mg/dL
Specific Gravity, Urine: <=1.005 (ref 1.005–1.030)
Urobilinogen, UA: 0.2 mg/dL (ref 0.0–1.0)
pH: 6 (ref 5.0–8.0)

## 2021-04-17 MED ORDER — METRONIDAZOLE 500 MG PO TABS: 500.0000 mg | ORAL_TABLET | Freq: Two times a day (BID) | ORAL | 0 refills | Status: AC

## 2021-04-17 NOTE — ED Provider Notes (Signed)
[facility]    CSN: [id number] Arrival date & time: 04/17/21  1749      History   Chief Complaint Chief Complaint  Patient presents with   Vaginal Itching   Flank Pain   Urinary Urgency    HPI Brooke Lam is a 41 y.o. female.   Patient is here for vaginal itching and urinary symptoms.  In early January she was seen, + for bv and yeast.  Treated for this. Symptoms never really went away, itching continued.  + vaginal discharge, mild.  White/creamy.  + itching at this time.   She had BV about 6 months ago as well.  She started with left flank pain yesterday, as well as urinary urgency.  No fever/chills.  +sexually active, 1 partner.  No condom use.   Apparently she only took about 3 pills of the flagyl.  She was told to stop this by another provider in order to start amoxil for possible uti?    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 Authorizing Provider  fluconazole (DIFLUCAN) 150 MG tablet Take 1 tablet (150 mg total) by mouth daily. 03/25/21   Chase Picket, MD  metFORMIN (GLUCOPHAGE) 500 MG tablet Take 1 tablet (500 mg total) by mouth 2 (two) times daily with a meal. 01/28/21   McClung, Dionne Bucy, PA-C  olopatadine (PATANOL)  0.1 % ophthalmic solution Place 1 drop into both eyes 2 (two) times daily. 01/23/21   Argentina Donovan, PA-C  topiramate (TOPAMAX) 50 MG tablet Take 1/2 tablet at bedtime for one week, then increase to 1 tablet at bedtime Patient not taking: Reported on 02/21/2021 02/17/21   Pieter Partridge, DO    Family History Family History  Problem Relation Age of Onset   Diabetes Father    Hypertension Father    Diabetes Paternal Grandmother    Diabetes Mother    Hypertension Mother    Diabetes Paternal Aunt    Anesthesia problems Neg Hx    Hearing loss Neg Hx    Other Neg Hx     Social History Social History   Tobacco Use   Smoking status: Former    Types: Cigarettes    Quit date: 2005    Years since quitting: 18.0   Smokeless tobacco: Never  Vaping Use   Vaping Use: Never used  Substance Use Topics   Alcohol use: Yes    Alcohol/week:  0.0 standard drinks    Comment: every 8 days 5-8 drinks of wine and whiskey   Drug use: No     Allergies   Patient has no known allergies.   Review of Systems Review of Systems  Constitutional: Negative.   HENT: Negative.    Respiratory: Negative.    Cardiovascular: Negative.   Gastrointestinal:  Positive for abdominal pain.  Genitourinary:  Positive for flank pain and vaginal discharge.    Physical Exam Triage Vital Signs ED Triage Vitals  Enc Vitals Group     BP 04/17/21 0836 135/88     Pulse Rate 04/17/21 0836 72     Resp 04/17/21 0836 17     Temp 04/17/21 0836 98.2 F (36.8 C)     Temp Source 04/17/21 0836 Oral     SpO2 04/17/21 0836 97 %     Weight --      Height --      Head Circumference --      Peak Flow --      Pain Score 04/17/21 0835 4     Pain Loc --      Pain Edu? --      Excl. in [place]? --    No data found.  Updated Vital Signs BP 135/88 (BP Location: Left Arm)    Pulse 72    Temp 98.2 F (36.8 C) (Oral)    Resp 17    LMP 10/08/2011    SpO2 97%   Visual Acuity Right Eye Distance:   Left Eye Distance:    Bilateral Distance:    Right Eye Near:   Left Eye Near:    Bilateral Near:     Physical Exam Constitutional:      Appearance: Normal appearance.  HENT:     Head: Normocephalic and atraumatic.  Cardiovascular:     Rate and Rhythm: Normal rate and regular rhythm.  Pulmonary:     Effort: Pulmonary effort is normal.     Breath sounds: Normal breath sounds.  Abdominal:     Palpations: Abdomen is soft.     Tenderness: There is abdominal tenderness in the suprapubic area. There is no right CVA tenderness or left CVA tenderness.  Neurological:     Mental Status: She is alert.     UC Treatments / Results  Labs (all labs ordered are listed, but only abnormal results are displayed) Labs Reviewed  POCT URINALYSIS DIPSTICK, ED / UC - Abnormal; Notable for the following components:      Result Value   Hgb urine dipstick SMALL (*)    All other components within normal limits    EKG   Radiology No results found.  Procedures Procedures (including critical care time)  Medications Ordered in UC Medications - No data to display  Initial Impression / Assessment and Plan / UC Course  I have reviewed the triage vital signs and the nursing notes.  Pertinent labs & imaging results that were available during my care of the patient were reviewed by me and considered in my medical decision making (see chart for details).  Clinical Course as of 04/17/21 0911  Thu Apr 17, 2021  0857 Specific Gravity, Urine: <=1.005 [EP]    Clinical Course User Index [EP] Rondel Oh, MD   Patient seen today for urinary/vaginal symptoms. Likely recurrent BV as she did not complete prior course of flagyl.  Will treat today.  Vaginal culture and urine culture pending.  Will follow up with ob/gyn for recurrent issues.  Final Clinical Impressions(s) / UC Diagnoses   Final diagnoses:  Vaginal discharge  Flank pain  Urinary urgency     Discharge Instructions      You were seen today for  vaginal itching, discharge, and urinary symptoms.  Your urine looked okay today, but we will send for culture.  Your vaginal swab will be resulted tomorrow.  I will treat you for presumed bacterial vaginosis, as you did not complete the previous course of flagyl.  Please take this twice/day x 7 days.  Avoid alcohol as this may cause upset stomach.   Please follow up with an ob/gyn in regards to your recurrent vaginal discharge/bacterial vaginosis.     ED Prescriptions     Medication Sig Dispense Auth. Provider   metroNIDAZOLE (FLAGYL) 500 MG tablet Take 1 tablet (500 mg total) by mouth 2 (two) times daily for 7 days. 14 tablet Rondel Oh, MD      PDMP not reviewed this encounter.   Rondel Oh, MD 04/17/21 [phone]

## 2021-04-17 NOTE — ED Triage Notes (Signed)
Pt reports had vaginal itching since beginning of Jan. Reports got antibiotics for BV and yeast that was positive for. Reports that started having itching again afterwards. Reports has left flank pain as well. Reports has urge to urinate but when goes only small amount comes out.

## 2021-04-17 NOTE — Telephone Encounter (Signed)
Pt following up on referral request for a DENTIST. Pt states she is bleeding on left side when she brushes her teeth. Requesting appt asap. Pt has the orange card. [facility]

## 2021-04-17 NOTE — Telephone Encounter (Signed)
° °  Chief Complaint: Vaginal discharge Symptoms: Discharge is bloody and has mucus. Seen in ED today. Frequency: Started earlier this month Pertinent Negatives: Patient denies urinary symptoms.Concerned because "I have these medicines and they don't help. I think I need something else." Disposition: [] ED /[] Urgent Care (no appt availability in office) / [] Appointment(In office/virtual)/ []  [facility]/ [] Home Care/ [] Refused Recommended Disposition /[]  Mobile Bus/ []  Follow-up with PCP Additional Notes: Please advise pt.   Answer Assessment - Initial Assessment Questions 1. DISCHARGE: "Describe the discharge." (e.g., white, yellow, green, gray, foamy, cottage cheese-like)     Bloody, mucus 2. ODOR: "Is there a bad odor?"     No 3. ONSET: "When did the discharge begin?"     Today 4. RASH: "Is there a rash in that area?" If Yes, ask: "Describe it." (e.g., redness, blisters, sores, bumps)     No 5. ABDOMINAL PAIN: "Are you having any abdominal pain?" If Yes, ask: "What does it feel like? " (e.g., crampy, dull, intermittent, constant)      Lower 6. ABDOMINAL PAIN SEVERITY: If present, ask: "How bad is it?"  (e.g., mild, moderate, severe)  - MILD - doesn't interfere with normal activities   - MODERATE - interferes with normal activities or awakens from sleep   - SEVERE - patient doesn't want to move (R/O peritonitis)      4-5 7. CAUSE: "What do you think is causing the discharge?" "Have you had the same problem before? What happened then?"     Unsure 8. OTHER SYMPTOMS: "Do you have any other symptoms?" (e.g., fever, itching, vaginal bleeding, pain with urination, injury to genital area, vaginal foreign body)     Itchy 9. PREGNANCY: "Is there any chance you are pregnant?" "When was your last menstrual period?"     No  Protocols used: Vaginal Discharge-A-AH

## 2021-04-17 NOTE — Discharge Instructions (Addendum)
You were seen today for vaginal itching, discharge, and urinary symptoms.  Your urine looked okay today, but we will send for culture.  Your vaginal swab will be resulted tomorrow.  I will treat you for presumed bacterial vaginosis, as you did not complete the previous course of flagyl.  Please take this twice/day x 7 days.  Avoid alcohol as this may cause upset stomach.   Please follow up with an ob/gyn in regards to your recurrent vaginal discharge/bacterial vaginosis.

## 2021-04-18 LAB — CERVICOVAGINAL ANCILLARY ONLY
Bacterial Vaginitis (gardnerella): NEGATIVE
Candida Glabrata: NEGATIVE
Candida Vaginitis: NEGATIVE
Chlamydia: NEGATIVE
Comment: NEGATIVE
Comment: NEGATIVE
Comment: NEGATIVE
Comment: NEGATIVE
Comment: NEGATIVE
Comment: NORMAL
Neisseria Gonorrhea: NEGATIVE
Trichomonas: NEGATIVE

## 2021-04-18 LAB — URINE CULTURE: Culture: 10000 — AB

## 2021-04-22 NOTE — Telephone Encounter (Signed)
Results were reviewed by the RN

## 2021-04-23 ENCOUNTER — Other Ambulatory Visit: Payer: Self-pay

## 2021-04-23 DIAGNOSIS — Z1231 Encounter for screening mammogram for malignant neoplasm of breast: Secondary | ICD-10-CM

## 2021-04-28 ENCOUNTER — Ambulatory Visit: Payer: Self-pay

## 2021-04-28 NOTE — Telephone Encounter (Signed)
°  Chief Complaint: swelling in fingers request appt  Symptoms: swelling to fingers, itching , swelling noted every am and goes away during the day . Started after completing antibiotics . C/o some pain in back  Frequency: x 3 weeks  Pertinent Negatives: Patient denies chest pain, difficulty breathing, no fever. No swelling in hands  Disposition: [] ED /[] Urgent Care (no appt availability in office) / [x] Appointment(In office/virtual)/ []  [facility]/ [] Home Care/ [] Refused Recommended Disposition /[] [facility]/ []  Follow-up with PCP Additional Notes:   Please advise if earlier appt available than 06/24/21. Recommended [facility] walk in clinic if symptoms worsen     Reason for Disposition  [1] Small area of LOCALIZED swelling AND [2] itchy  Answer Assessment - Initial Assessment Questions 1. ONSET: "When did the swelling start?" (e.g., minutes, hours, days)     3 weeks  2. LOCATION: "What part of the hand is swollen?"  "Are both hands swollen or just one hand?"     Fingers both hands  3. SEVERITY: "How bad is the swelling?" (e.g., localized; mild, moderate, severe)   - BALL OR LUMP: small ball or lump   - LOCALIZED: puffy or swollen area or patch of skin   - JOINT SWELLING: swelling of a joint   - MILD: puffiness or mild swelling of fingers or hand   - MODERATE: fingers and hand are swollen   - SEVERE: swelling of entire hand and up into forearm     Mild  4. REDNESS: "Does the swelling look red or infected?"     No  5. PAIN: "Is the swelling painful to touch?" If Yes, ask: "How painful is it?"   (Scale 1-10; mild, moderate or severe)     Back to back and stomach . 6. FEVER: "Do you have a fever?" If Yes, ask: "What is it, how was it measured, and when did it start?"      no 7. CAUSE: "What do you think is causing the hand swelling?" (e.g., heat, insect bite, pregnancy, recent injury)     Not sure  8. MEDICAL HISTORY: "Do you have a history of heart failure, kidney  disease, liver failure, or cancer?"     na 9. RECURRENT SYMPTOM: "Have you had hand swelling before?" If Yes, ask: "When was the last time?" "What happened that time?"     No  10. OTHER SYMPTOMS: "Do you have any other symptoms?" (e.g., blurred vision, difficulty breathing, headache)       Na  11. PREGNANCY: "Is there any chance you are pregnant?" "When was your last menstrual period?"       na  Protocols used: Hand Swelling-A-AH

## 2021-04-28 NOTE — Telephone Encounter (Signed)
Brooke Lam ID # [id number] Patient called, left VM to return the call to the office to discuss symptoms with a nurse.   Summary: hands swell in the morning   Patient called in, says hand swells every morning, then goes away. (Needs interpreter)

## 2021-05-23 ENCOUNTER — Other Ambulatory Visit (HOSPITAL_COMMUNITY)
Admission: RE | Admit: 2021-05-23 | Discharge: 2021-05-23 | Disposition: A | Discharge status: Home / Self Care | Payer: Self-pay | Source: Ambulatory Visit | Attending: Obstetrics | Admitting: Obstetrics

## 2021-05-23 ENCOUNTER — Encounter: Payer: Self-pay | Admitting: Obstetrics

## 2021-05-23 ENCOUNTER — Ambulatory Visit (INDEPENDENT_AMBULATORY_CARE_PROVIDER_SITE_OTHER): Payer: Self-pay | Admitting: Obstetrics

## 2021-05-23 ENCOUNTER — Other Ambulatory Visit: Payer: Self-pay

## 2021-05-23 VITALS — BP 116/67 | HR 68 | Ht 63.0 in | Wt 171.0 lb

## 2021-05-23 DIAGNOSIS — N898 Other specified noninflammatory disorders of vagina: Secondary | ICD-10-CM

## 2021-05-23 DIAGNOSIS — N930 Postcoital and contact bleeding: Secondary | ICD-10-CM | POA: Insufficient documentation

## 2021-05-23 DIAGNOSIS — Z0000 Encounter for general adult medical examination without abnormal findings: Secondary | ICD-10-CM

## 2021-05-23 DIAGNOSIS — Z01411 Encounter for gynecological examination (general) (routine) with abnormal findings: Secondary | ICD-10-CM

## 2021-05-23 DIAGNOSIS — E139 Other specified diabetes mellitus without complications: Secondary | ICD-10-CM

## 2021-05-23 MED ORDER — METFORMIN HCL 500 MG PO TABS: 1000.0000 mg | ORAL_TABLET | Freq: Two times a day (BID) | ORAL | 3 refills | Status: DC

## 2021-05-23 NOTE — Progress Notes (Signed)
Patient ID: Brooke Lam, female   DOB: 09-15-1980, 41 y.o.   MRN: [id number]  Chief Complaint  Patient presents with   Gynecologic Exam    Brooke Lam is a 41 y.o. female.  Complains of malodorous vaginal discharge.  Has a history of recurrent BV.  Also c/o vaginal bleeding after intercourse.  She s/p Emergency TAH in 2014 for placenta previa. Brooke  Past Medical History:  Diagnosis Date   Anemia    Blood transfusion without reported diagnosis    Ectopic pregnancy    Hypertension    Preterm labor     Past Surgical History:  Procedure Laterality Date   ABDOMINAL HYSTERECTOMY     BREAST BIOPSY Right 2013   FA   CESAREAN SECTION     C/S x 4   UPPER GASTROINTESTINAL ENDOSCOPY  04/23/2020    Family History  Problem Relation Age of Onset   Diabetes Father    Hypertension Father    Diabetes Paternal Grandmother    Diabetes Mother    Hypertension Mother    Diabetes Paternal Aunt    Anesthesia problems Neg Hx    Hearing loss Neg Hx    Other Neg Hx     Social History Social History   Tobacco Use   Smoking status: Former    Types: Cigarettes    Quit date: 2005    Years since quitting: 18.1   Smokeless tobacco: Never  Vaping Use   Vaping Use: Never used  Substance Use Topics   Alcohol use: Yes    Alcohol/week: 0.0 standard drinks    Comment: every 8 days 5-8 drinks of wine and whiskey   Drug use: No    No Known Allergies  Current Outpatient Medications  Medication Sig Dispense Refill   fluconazole (DIFLUCAN) 150 MG tablet Take 1 tablet (150 mg total) by mouth daily. 2 tablet 0   metFORMIN (GLUCOPHAGE) 500 MG tablet Take 2 tablets (1,000 mg total) by mouth 2 (two) times daily with a meal. 180 tablet 3   olopatadine (PATANOL) 0.1 % ophthalmic solution Place 1 drop into both eyes 2 (two) times daily. 5 mL 12   topiramate (TOPAMAX) 50 MG tablet Take 1/2 tablet at bedtime for one week, then increase to 1 tablet at bedtime (Patient not taking:  Reported on 02/21/2021) 30 tablet 0   No current facility-administered medications for this visit.    Review of Systems Review of Systems Constitutional: negative for fatigue and weight loss Respiratory: negative for cough and wheezing Cardiovascular: negative for chest pain, fatigue and palpitations Gastrointestinal: negative for abdominal pain and change in bowel habits Genitourinary: positive for vaginal discharge Integument/breast: negative for nipple discharge Musculoskeletal:negative for myalgias Neurological: negative for gait problems and tremors Behavioral/Psych: negative for abusive relationship, depression Endocrine: negative for temperature intolerance      Blood pressure 116/67, pulse 68, height 5\' 3"  (1.6 m), weight 171 lb (77.6 kg), last menstrual period 10/08/2011.  Physical Exam Physical Exam General:   Alert and no distress  Skin:   no rash or abnormalities  Lungs:   clear to auscultation bilaterally  Heart:   regular rate and rhythm, S1, S2 normal, no murmur, click, rub or gallop  Breasts:   normal without suspicious masses, skin or nipple changes or axillary nodes  Abdomen:  normal findings: no organomegaly, soft, non-tender and no hernia  Pelvis:  External genitalia: normal general appearance Urinary system: urethral meatus normal and bladder without fullness, nontender Vaginal: normal without  tenderness, induration or masses Cervix: absent Adnexa: normal bimanual exam Uterus: absent    I have spent a total of 20 minutes of face-to-face time, excluding clinical staff time, reviewing notes and preparing to see patient, ordering tests and/or medications, and counseling the patient.   Data Reviewed Labs  Assessment     1. Vaginal discharge  2. 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  Dispense: 180 tablet; Refill: 3  4. Routine adult health maintenance Rx: - Comprehensive metabolic panel - Hemoglobin A1c - referral to Ambulatory Internal Medicine    Plan   Follow up in 2 weeks  Orders Placed This Encounter  Procedures   US PELVIC COMPLETE WITH TRANSVAGINAL    Standing Status:   Future    Standing Expiration Date:   05/24/2022    Order Specific Question:   Reason for Exam (SYMPTOM  OR DIAGNOSIS REQUIRED)    Answer:   Postcoital bleeding    Order Specific Question:   Preferred imaging location?    Answer:   [facility]   CBC   Comprehensive metabolic panel   Hemoglobin A1c   Ferritin   Meds ordered this encounter  Medications   metFORMIN (GLUCOPHAGE) 500 MG tablet    Sig: Take 2 tablets (1,000 mg total) by mouth 2 (two) times daily with a meal.    Dispense:  180 tablet    Refill:  3      Shelly Bombard, MD 05/23/2021 10:09 AM

## 2021-05-23 NOTE — Progress Notes (Signed)
41 y.o New GYN presents for recurring BV, vaginal itching. Patient is Pre Diabetic, and was placed on Metformin which is not taking.  ? ?Last PAP 05/16/2020. ?

## 2021-05-24 LAB — COMPREHENSIVE METABOLIC PANEL
ALT: 13 IU/L (ref 0–32)
AST: 18 IU/L (ref 0–40)
Albumin/Globulin Ratio: 1.9 (ref 1.2–2.2)
Albumin: 4.8 g/dL (ref 3.8–4.8)
Alkaline Phosphatase: 75 IU/L (ref 44–121)
BUN/Creatinine Ratio: 19 (ref 9–23)
BUN: 14 mg/dL (ref 6–24)
Bilirubin Total: 0.4 mg/dL (ref 0.0–1.2)
CO2: 24 mmol/L (ref 20–29)
Calcium: 9.4 mg/dL (ref 8.7–10.2)
Chloride: 101 mmol/L (ref 96–106)
Creatinine, Ser: 0.72 mg/dL (ref 0.57–1.00)
Globulin, Total: 2.5 g/dL (ref 1.5–4.5)
Glucose: 98 mg/dL (ref 70–99)
Potassium: 4.2 mmol/L (ref 3.5–5.2)
Sodium: 139 mmol/L (ref 134–144)
Total Protein: 7.3 g/dL (ref 6.0–8.5)
eGFR: 107 mL/min/{1.73_m2} (ref >59)

## 2021-05-24 LAB — CBC
Hematocrit: 37.5 % (ref 34.0–46.6)
Hemoglobin: 12.2 g/dL (ref 11.1–15.9)
MCH: 26.8 pg (ref 26.6–33.0)
MCHC: 32.5 g/dL (ref 31.5–35.7)
MCV: 82 fL (ref 79–97)
Platelets: 226 10*3/uL (ref 150–450)
RBC: 4.55 x10E6/uL (ref 3.77–5.28)
RDW: 13.8 % (ref 11.7–15.4)
WBC: 7.2 10*3/uL (ref 3.4–10.8)

## 2021-05-24 LAB — HEMOGLOBIN A1C
Est. average glucose Bld gHb Est-mCnc: 128 mg/dL
Hgb A1c MFr Bld: 6.1 % — ABNORMAL HIGH (ref 4.8–5.6)

## 2021-05-24 LAB — FERRITIN: Ferritin: 240 ng/mL — ABNORMAL HIGH (ref 15–150)

## 2021-05-26 ENCOUNTER — Telehealth: Payer: Self-pay

## 2021-05-26 LAB — CERVICOVAGINAL ANCILLARY ONLY
Bacterial Vaginitis (gardnerella): POSITIVE — AB
Candida Glabrata: NEGATIVE
Candida Vaginitis: NEGATIVE
Comment: NEGATIVE
Comment: NEGATIVE
Comment: NEGATIVE
Comment: NEGATIVE
Trichomonas: NEGATIVE

## 2021-05-26 NOTE — Telephone Encounter (Signed)
S/w patient and advised of results ?

## 2021-05-27 ENCOUNTER — Other Ambulatory Visit: Payer: Self-pay | Admitting: Obstetrics

## 2021-05-27 DIAGNOSIS — B9689 Other specified bacterial agents as the cause of diseases classified elsewhere: Secondary | ICD-10-CM

## 2021-05-27 MED ORDER — METRONIDAZOLE 500 MG PO TABS: 500.0000 mg | ORAL_TABLET | Freq: Two times a day (BID) | ORAL | 2 refills | Status: DC

## 2021-05-28 ENCOUNTER — Telehealth: Payer: Self-pay

## 2021-05-28 NOTE — Telephone Encounter (Signed)
S/w pt via interpreter and advised of results and rx ?

## 2021-05-29 ENCOUNTER — Ambulatory Visit
Admission: RE | Admit: 2021-05-29 | Discharge: 2021-05-29 | Disposition: A | Discharge status: Home / Self Care | Payer: Self-pay | Source: Ambulatory Visit | Attending: Obstetrics | Admitting: Obstetrics

## 2021-05-29 ENCOUNTER — Other Ambulatory Visit: Payer: Self-pay

## 2021-05-29 DIAGNOSIS — N930 Postcoital and contact bleeding: Secondary | ICD-10-CM | POA: Insufficient documentation

## 2021-06-03 ENCOUNTER — Other Ambulatory Visit: Payer: Self-pay | Admitting: Obstetrics

## 2021-06-03 DIAGNOSIS — N930 Postcoital and contact bleeding: Secondary | ICD-10-CM

## 2021-06-06 ENCOUNTER — Other Ambulatory Visit: Payer: Self-pay

## 2021-06-06 ENCOUNTER — Encounter: Payer: Self-pay | Admitting: Obstetrics

## 2021-06-06 ENCOUNTER — Other Ambulatory Visit: Payer: Self-pay | Admitting: Obstetrics

## 2021-06-06 ENCOUNTER — Ambulatory Visit (INDEPENDENT_AMBULATORY_CARE_PROVIDER_SITE_OTHER): Payer: Self-pay | Admitting: Obstetrics

## 2021-06-06 VITALS — BP 128/83 | HR 60 | Wt 176.4 lb

## 2021-06-06 DIAGNOSIS — N930 Postcoital and contact bleeding: Secondary | ICD-10-CM

## 2021-06-06 DIAGNOSIS — N83201 Unspecified ovarian cyst, right side: Secondary | ICD-10-CM

## 2021-06-06 DIAGNOSIS — Z90710 Acquired absence of both cervix and uterus: Secondary | ICD-10-CM

## 2021-06-06 NOTE — Progress Notes (Signed)
Patient ID: Brooke Lam, female   DOB: 1981/03/07, 41 y.o.   MRN: [id number] ? ?Chief Complaint  ?Patient presents with  ? Follow-up  ? ? ?HPI ?Brooke Lam is a 41 y.o. female.  History of post coital bleeding after a cesarean hysterectomy for placenta increta.  Presents today for ultrasound results and labs. ?HPI ? ?Past Medical History:  ?Diagnosis Date  ? Anemia   ? Blood transfusion without reported diagnosis   ? Ectopic pregnancy   ? Hypertension   ? Preterm labor   ? ? ?Past Surgical History:  ?Procedure Laterality Date  ? ABDOMINAL HYSTERECTOMY    ? BREAST BIOPSY Right 2013  ? FA  ? CESAREAN SECTION    ? C/S x 4  ? UPPER GASTROINTESTINAL ENDOSCOPY  04/23/2020  ? ? ?Family History  ?Problem Relation Age of Onset  ? Diabetes Father   ? Hypertension Father   ? Diabetes Paternal Grandmother   ? Diabetes Mother   ? Hypertension Mother   ? Diabetes Paternal Aunt   ? Anesthesia problems Neg Hx   ? Hearing loss Neg Hx   ? Other Neg Hx   ? ? ?Social History ?Social History  ? ?Tobacco Use  ? Smoking status: Former  ?  Types: Cigarettes  ?  Quit date: 2005  ?  Years since quitting: 18.2  ? Smokeless tobacco: Never  ?Vaping Use  ? Vaping Use: Never used  ?Substance Use Topics  ? Alcohol use: Yes  ?  Alcohol/week: 0.0 standard drinks  ?  Comment: every 8 days 5-8 drinks of wine and whiskey  ? Drug use: No  ? ? ?No Known Allergies ? ?Current Outpatient Medications  ?Medication Sig Dispense Refill  ? fluconazole (DIFLUCAN) 150 MG tablet Take 1 tablet (150 mg total) by mouth daily. (Patient not taking: Reported on 06/06/2021) 2 tablet 0  ? metFORMIN (GLUCOPHAGE) 500 MG tablet Take 2 tablets (1,000 mg total) by mouth 2 (two) times daily with a meal. 180 tablet 3  ? olopatadine (PATANOL) 0.1 % ophthalmic solution Place 1 drop into both eyes 2 (two) times daily. 5 mL 12  ? metroNIDAZOLE (FLAGYL) 500 MG tablet Take 1 tablet (500 mg total) by mouth 2 (two) times daily. 14 tablet 2  ? topiramate (TOPAMAX) 50  MG tablet Take 1/2 tablet at bedtime for one week, then increase to 1 tablet at bedtime (Patient not taking: Reported on 02/21/2021) 30 tablet 0  ? ?No current facility-administered medications for this visit.  ? ?  ?US PELVIC COMPLETE WITH TRANSVAGINAL (Accession [id number]) (Order [id number]) ?Imaging ?Date: 05/29/2021 Department: [facility] Released By: Honor Junes Authorizing: Shelly Bombard, MD  ? ?Exam Status ? ?Status  ?Final [99]  ? ?PACS Intelerad Image Link ? ? Show images for US PELVIC COMPLETE WITH TRANSVAGINAL ?Study Result ? ?Narrative & Impression  ?CLINICAL DATA:  Postcoital bleeding for more than 10 years; history ?of caesarian section x 4, hysterectomy ?  ?EXAM: ?TRANSABDOMINAL AND TRANSVAGINAL ULTRASOUND OF PELVIS ?  ?TECHNIQUE: ?Both transabdominal and transvaginal ultrasound examinations of the ?pelvis were performed. Transabdominal technique was performed for ?global imaging of the pelvis including uterus, ovaries, adnexal ?regions, and pelvic cul-de-sac. It was necessary to proceed with ?endovaginal exam following the transabdominal exam to visualize the ?ovaries. ?  ?COMPARISON:  10/31/2014 ?  ?FINDINGS: ?Uterus ?  ?Surgically absent. Heterogenous retained cervix with a few nabothian ?cysts. No focal mass. ?  ?Endometrium ?  ?Not visualized, likely obscured  by bowel ?  ?Right ovary ?  ?Measurements: 7.3 x 3.1 x 5.7 cm = volume: 68.3 mL. Small dominant ?follicle; no follow-up imaging recommended. Additional complicated ?cystic mass 5.1 x 4.0 x 4.8 cm, containing numerous mobile internal ?echoes, question endometrioma or less likely complicated/hemorrhagic ?cyst, decreased in size since 2021 when it measured 7.8 x 6.6 x 6.0 ?cm. ?  ?Left ovary ?  ?Measurements: 4.1 x 3.3 x 2.7 cm = volume: 21.5 mL. Dominant ?follicle without additional mass; no follow-up imaging recommended. ?  ?Other findings ?  ?Free pelvic fluid.  No other masses. ?   ?IMPRESSION: ?Complex cystic mass of the RIGHT ovary 5.1 cm greatest size, ?decreased in size since 2016 and most likely representing an ?endometrioma or less likely complicated/hemorrhagic cyst; yearly ?follow-up sonographic imaging recommended. ?  ?Remainder of exam unremarkable. ?  ?  ?Electronically Signed ?  By: Lavonia Dana M.D. ?  On: 05/29/2021 13:25 ?   ? ? ?Review of Systems ?Review of Systems ?Constitutional: negative for fatigue and weight loss ?Respiratory: negative for cough and wheezing ?Cardiovascular: negative for chest pain, fatigue and palpitations ?Gastrointestinal: negative for abdominal pain and change in bowel habits ?Genitourinary:negative ?Integument/breast: negative for nipple discharge ?Musculoskeletal:negative for myalgias ?Neurological: negative for gait problems and tremors ?Behavioral/Psych: negative for abusive relationship, depression ?Endocrine: negative for temperature intolerance    ?  ?Blood pressure 128/83, pulse 60, weight 176 lb 6.4 oz (80 kg), last menstrual period 10/08/2011. ? ?Physical Exam ?Physical Exam ?General:   Alert and no distress  ?Skin:   no rash or abnormalities  ?Lungs:   clear to auscultation bilaterally  ?Heart:   regular rate and rhythm, S1, S2 normal, no murmur, click, rub or gallop  ?The remainder of the physical exam deferred due to the type of encounter. ? ?I have spent a total of 20 minutes of face-to-face time, excluding clinical staff time, reviewing notes and preparing to see patient, ordering tests and/or medications, and counseling the patient.  ? ?Data Reviewed ?Records from [facility] ?Labs ?Ultrasound ?  ? ?Assessment  ?   ?1. PCB (post coital bleeding) ?Rx: ?- Ambulatory referral to Urogynecology ? ?2. S/P emergency cesarean hysterectomy ?-  need operative note to determine if supracervical hysterectomy done, which could caaount for the vaginal bleeding ? ?3. Cyst of right ovary, possible hemorrhagic cyst vs endometrioma ?  ?  ? ?Plan ?   Follow up prn ? ?Orders Placed This Encounter  ?Procedures  ? Ambulatory referral to Urogynecology  ?  Referral Priority:   Routine  ?  Referral Type:   Consultation  ?  Referral Reason:   Specialty Services Required  ?  Requested Specialty:   Urology  ?  Number of Visits Requested:   1  ? ? ?Shelly Bombard, MD ?06/06/2021 10:11 AM  ?

## 2021-06-06 NOTE — Progress Notes (Signed)
Pt is in the office for 2 week follow up to discuss labs. ?

## 2021-06-09 ENCOUNTER — Other Ambulatory Visit: Payer: Self-pay

## 2021-06-09 ENCOUNTER — Encounter: Payer: Self-pay | Admitting: Plastic Surgery

## 2021-06-09 ENCOUNTER — Ambulatory Visit (INDEPENDENT_AMBULATORY_CARE_PROVIDER_SITE_OTHER): Payer: Self-pay | Admitting: Plastic Surgery

## 2021-06-09 VITALS — BP 121/81 | HR 74 | Ht 63.0 in | Wt 174.2 lb

## 2021-06-09 DIAGNOSIS — M793 Panniculitis, unspecified: Secondary | ICD-10-CM

## 2021-06-10 NOTE — Progress Notes (Signed)
? ?Referring Provider ?Ladell Pier, MD ?[address] ?[address] ?[place],  [place] [address]  ? ?CC:  ?Lower abdominal scar after C-section x4 ? ?Brooke Lam is an 41 y.o. female.  ?HPI: Patient is a 41 year old with a history of 4 C-sections.  She notes that she has pain in her lower abdominal scar when she wears a garment.  She is interested in having improvement over the scarred area.  She has a horizontal and vertical scar from C-section.  She notes that on the right side she has edema and she wears pants.  The painful scar is interfering with her daily living.  She is a nonsmoker. ? ?No Known Allergies ? ?Outpatient Encounter Medications as of 06/09/2021  ?Medication Sig  ? fluconazole (DIFLUCAN) 150 MG tablet Take 1 tablet (150 mg total) by mouth daily. (Patient not taking: Reported on 06/06/2021)  ? metFORMIN (GLUCOPHAGE) 500 MG tablet Take 2 tablets (1,000 mg total) by mouth 2 (two) times daily with a meal.  ? metroNIDAZOLE (FLAGYL) 500 MG tablet Take 1 tablet (500 mg total) by mouth 2 (two) times daily.  ? olopatadine (PATANOL) 0.1 % ophthalmic solution Place 1 drop into both eyes 2 (two) times daily.  ? topiramate (TOPAMAX) 50 MG tablet Take 1/2 tablet at bedtime for one week, then increase to 1 tablet at bedtime (Patient not taking: Reported on 02/21/2021)  ? ?No facility-administered encounter medications on file as of 06/09/2021.  ?  ? ?Past Medical History:  ?Diagnosis Date  ? Anemia   ? Blood transfusion without reported diagnosis   ? Ectopic pregnancy   ? Hypertension   ? Preterm labor   ? ? ?Past Surgical History:  ?Procedure Laterality Date  ? ABDOMINAL HYSTERECTOMY    ? BREAST BIOPSY Right 2013  ? FA  ? CESAREAN SECTION    ? C/S x 4  ? UPPER GASTROINTESTINAL ENDOSCOPY  04/23/2020  ? ? ?Family History  ?Problem Relation Age of Onset  ? Diabetes Father   ? Hypertension Father   ? Diabetes Paternal Grandmother   ? Diabetes Mother   ? Hypertension Mother   ? Diabetes Paternal Aunt   ?  Anesthesia problems Neg Hx   ? Hearing loss Neg Hx   ? Other Neg Hx   ? ? ?Social History  ? ?Social History Narrative  ? Not on file  ?  ? ?Review of Systems ?General: Denies fevers, chills, weight loss ?CV: Denies chest pain, shortness of breath, palpitations ? ? ?Physical Exam ?Vitals with BMI 06/09/2021 06/06/2021 05/23/2021  ?Height '5\' 3"'$  - '5\' 3"'$   ?Weight 174 lbs 3 oz 176 lbs 6 oz 171 lbs  ?BMI 30.87 31.26 30.3  ?Systolic 601 093 235  ?Diastolic 81 83 67  ?Pulse 74 60 68  ?  ?General:  No acute distress,  Alert and oriented, Non-Toxic, Normal speech and affect ?Abdomen: Horizontal and vertical scar lower abdomen.  Some increased fullness in the right side compared to the left.  She has some adhesions from where her C-section scars appear to be adherent to the underlying fascia. ? ?Assessment/Plan ?Patient with lower abdominal scarring.  She is most concerned with the horizontal scar in the abdominal bulging and less concerned with the vertical scar.  I think this could best be addressed with abdominal panniculectomy which would remove the scar as well as some of the areas that are frequently edematous.  Another option would be a more limited operation with a scar revision.  We also discussed that cosmetic abdominoplasty could be of benefit but the patient is not interested in any cosmetic procedures at this time. ? ?Lennice Sites ?06/10/2021, 9:22 AM  ? ? ?  ?

## 2021-06-19 ENCOUNTER — Ambulatory Visit
Admission: RE | Admit: 2021-06-19 | Discharge: 2021-06-19 | Disposition: A | Discharge status: Home / Self Care | Payer: No Typology Code available for payment source | Source: Ambulatory Visit | Attending: Obstetrics and Gynecology | Admitting: Obstetrics and Gynecology

## 2021-06-19 ENCOUNTER — Ambulatory Visit: Payer: Self-pay | Admitting: *Deleted

## 2021-06-19 VITALS — BP 118/72 | Wt 174.1 lb

## 2021-06-19 DIAGNOSIS — Z1239 Encounter for other screening for malignant neoplasm of breast: Secondary | ICD-10-CM

## 2021-06-19 DIAGNOSIS — Z1231 Encounter for screening mammogram for malignant neoplasm of breast: Secondary | ICD-10-CM

## 2021-06-19 NOTE — Progress Notes (Signed)
Brooke Lam is a 41 y.o. female who presents to [facility] clinic today with no complaints.  ?  ?Pap Smear: Pap smear not completed today. Last Pap smear was 05/16/2020 at [facility] clinic and was normal with negative HPV. Per patient has no history of an abnormal Pap smear. 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 Last Pap smear result is available in Epic. ?  ?Physical exam: ?Breasts ?Right breast slightly larger than left breast that per patient is normal for her. No skin abnormalities bilateral breasts. No nipple retraction bilateral breasts. No nipple discharge bilateral breasts. No lymphadenopathy. No lumps palpated bilateral breasts. No complaints of pain or tenderness on exam.     ? ?MS DIGITAL SCREENING TOMO BILATERAL ? ?Result Date: 05/21/2020 ?CLINICAL DATA:  Screening. EXAM: DIGITAL SCREENING BILATERAL MAMMOGRAM WITH TOMOSYNTHESIS AND CAD TECHNIQUE: Bilateral screening digital craniocaudal and mediolateral oblique mammograms were obtained. Bilateral screening digital breast tomosynthesis was performed. The images were evaluated with computer-aided detection. COMPARISON:  Previous exam(s). ACR Breast Density Category b: There are scattered areas of fibroglandular density. FINDINGS: There are no findings suspicious for malignancy. The images were evaluated with computer-aided detection. IMPRESSION: No mammographic evidence of malignancy. A result letter of this screening mammogram will be mailed directly to the patient. RECOMMENDATION: Screening mammogram in one year. (Code:SM-B-01Y) BI-RADS CATEGORY  1: Negative. Electronically Signed   By: Lillia Mountain M.D.   On: 05/21/2020 07:17  ? ?MS DIGITAL DIAG TOMO BILAT ? ?Result Date: 05/10/2019 ?CLINICAL DATA:  Patient complains of a palpable right breast mass. 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 EXAM: DIGITAL DIAGNOSTIC  BILATERAL MAMMOGRAM WITH CAD AND TOMO ULTRASOUND RIGHT BREAST COMPARISON:  Prior ultrasounds dated 12/09/2011 and 12/16/2011. ACR Breast Density Category b: There are scattered areas of fibroglandular density. FINDINGS: There is a lobulated 2.2 cm mass in the medial aspect of the right breast with a ribbon shaped clip. No additional mass is seen in the right breast. There is no mass seen in the left breast. There are no malignant type microcalcifications in either breast. Mammographic images were processed with CAD. Targeted ultrasound is performed, showing a hypoechoic mass in the right breast at 3 o'clock 2 cm from the nipple measuring 2.2 x 0.7 x 1.1 cm. On the prior exam dated 12/09/2011 it measured 2.2 x 0.8 x 1.5 cm. 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 No evidence of malignancy in either breast. RECOMMENDATION: Bilateral screening mammogram in 1 year is recommended. I have discussed the findings and recommendations with the patient. If applicable, a reminder letter will be sent to the patient regarding the next appointment. BI-RADS CATEGORY  2: Benign. Electronically Signed   By: Lillia Mountain M.D.   On: 05/10/2019 14:24   ?  ?Pelvic/Bimanual ?Pap is not indicated today per BCCCP guidelines. ?  ?Smoking History: ?Patient is a former smoker that quit in 2003. ?  ?Patient Navigation: ?Patient education provided. Access to services provided for patient through [facility] program. Spanish interpreter Rudene Anda from [facility] provided. ?  ?Breast and Cervical Cancer Risk Assessment: ?Patient does not have family history of breast cancer, known genetic mutations, or radiation treatment to the chest before age 14. Patient does not have history of cervical dysplasia, immunocompromised, or DES exposure in-utero. ? ?Risk Assessment   ? ? Risk Scores   ? ?  06/19/2021 05/16/2020  ? Last edited by: Demetrius Revel, LPN McG[place], Karlton Lemon, LPN  ? 5-year risk: 0.3 % 0.3 %   ? Lifetime risk: 4.6 % 4.7 %  ? ?  ?  ? ?  ? ? ?A: ?BCCCP exam without pap smear ?No complaints. ? ?P: ?Referred patient to the [facility] for a screening mammogram on mobile unit. Appointment scheduled Thursday, June 19, 2021 at 1110. ? ?Loletta Parish, RN ?06/19/2021 10:44 AM   ?

## 2021-06-19 NOTE — Patient Instructions (Signed)
Explained breast self awareness with Cindra Presume. Patient did not need a Pap smear today due to last Pap smear and HPV typing was 05/16/2020. Let her know BCCCP will cover Pap smears and HPV typing every 5 years unless has a history of abnormal Pap smears. Referred patient to the [facility] for a screening mammogram on mobile unit. Appointment scheduled Thursday, June 19, 2021 at 1110. Patient aware of appointment and will be there. Let patient know the [facility] will follow up with her within the next couple weeks with results of her mammogram by letter or phone. Feleica Fulmore verbalized understanding. ? ?Deshae Dickison, Arvil Chaco, RN ?10:44 AM ? ? ? ? ?

## 2021-06-24 ENCOUNTER — Encounter: Payer: Self-pay | Admitting: Internal Medicine

## 2021-06-24 ENCOUNTER — Ambulatory Visit: Payer: No Typology Code available for payment source | Attending: Internal Medicine | Admitting: Internal Medicine

## 2021-06-24 VITALS — BP 126/80 | HR 70 | Resp 16 | Wt 174.0 lb

## 2021-06-24 DIAGNOSIS — E669 Obesity, unspecified: Secondary | ICD-10-CM

## 2021-06-24 DIAGNOSIS — R7303 Prediabetes: Secondary | ICD-10-CM

## 2021-06-24 DIAGNOSIS — R600 Localized edema: Secondary | ICD-10-CM

## 2021-06-24 DIAGNOSIS — E781 Pure hyperglyceridemia: Secondary | ICD-10-CM

## 2021-06-24 DIAGNOSIS — G5712 Meralgia paresthetica, left lower limb: Secondary | ICD-10-CM

## 2021-06-24 DIAGNOSIS — R030 Elevated blood-pressure reading, without diagnosis of hypertension: Secondary | ICD-10-CM

## 2021-06-24 DIAGNOSIS — Z6830 Body mass index (BMI) 30.0-30.9, adult: Secondary | ICD-10-CM

## 2021-06-24 MED ORDER — METFORMIN HCL 500 MG PO TABS: 500.0000 mg | ORAL_TABLET | Freq: Two times a day (BID) | ORAL | 3 refills | Status: DC

## 2021-06-24 NOTE — Progress Notes (Signed)
? ? ?Patient ID: Brooke Lam, female    DOB: [birth year]  MRN: [id number] ? ?CC: Edema (Pt states 5-6 months ago her hands swell only in the morning /) and Medication Management (Pt wants to discuss her metformin/Pt states taking 2 tablets in to much for her ) ? ? ?Subjective: ?Brooke Lam is a 41 y.o. female who presents for UC visit ?Her concerns today include:  ?Pt with hx of hyperTG, PreDM ? ?Pt c/o mild swelling in both hands in the mornings x 5 mths. Mainly palm of hands and fingers. Takes off rings at nights but notice fingers a little tight in mornings when she tries to put rings back on.  No pain or stiffness in hands, fingers, wrist.  No color changes ?Swelling resolves in 5-10 mins of moving around once she gets moving ? ?2-3 times in past mth she has noted some numbness of the left lateral thigh. Resolves once she gots up and moving around ? ?PreDM:  previously on Metformin 500 mg BID.  However her GYN recently increased dose to 1 gram BID.  A1C was 6.1. ?Checks BS QOD in a.m before BF.  Range 115-135.  Highest was 140.   ?Doing well with eating habits.  Eating more veggies, rarely eat bread/rice.  Does veggie shakes. ?Walks daily for 1 hr. ? ?Thinking about doing liposuction on abdomen.  She states that she has been working with a trainer for the past year doing abdominal crunches and other exercises to try to get rid of abdominal protrusion/fat since having her last child.  She has had no success. ?She has already seen the plastic surgeon.  She wants to know whether I think she would do okay undergoing anesthesia.   ? ?Wants TG recheck.  Was 400 02/2022.  Made changes in diet ?Patient Active Problem List  ? Diagnosis Date Noted  ? 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 Dyspareunia 11/12/2014  ? S/P emergency cesarean hysterectomy 08/01/2012  ? Dysuria 09/17/2011  ?  ? ?Current Outpatient Medications on File Prior to Visit  ?Medication Sig Dispense Refill  ? topiramate  (TOPAMAX) 50 MG tablet Take 1/2 tablet at bedtime for one week, then increase to 1 tablet at bedtime (Patient not taking: Reported on 02/21/2021) 30 tablet 0  ? ?No current facility-administered medications on file prior to visit.  ? ? ?No Known Allergies ? ?Social History  ? ?Socioeconomic History  ? Marital status: Single  ?  Spouse name: Not on file  ? Number of children: 4  ? Years of education: Not on file  ? Highest education level: High school graduate  ?Occupational History  ? Occupation: bartender  ?Tobacco Use  ? Smoking status: Former  ?  Types: Cigarettes  ?  Quit date: 2005  ?  Years since quitting: 18.2  ? Smokeless tobacco: Never  ?Vaping Use  ? Vaping Use: Never used  ?Substance and Sexual Activity  ? Alcohol use: Yes  ?  Alcohol/week: 0.0 standard drinks  ?  Comment: every 8 days 5-8 drinks of wine and whiskey  ? Drug use: No  ? Sexual activity: Yes  ?  Birth control/protection: None, Surgical  ?Other Topics Concern  ? Not on file  ?Social History Narrative  ? Not on file  ? ?Social Determinants of Health  ? ?Financial Resource Strain: Not on file  ?Food Insecurity: No Food Insecurity  ? Worried About Charity fundraiser in the Last Year: Never true  ? Ran  Out of Food in the Last Year: Never true  ?Transportation Needs: No Transportation Needs  ? Lack of Transportation (Medical): No  ? Lack of Transportation (Non-Medical): No  ?Physical Activity: Not on file  ?Stress: Not on file  ?Social Connections: Not on file  ?Intimate Partner Violence: Not on file  ? ? ?Family History  ?Problem Relation Age of Onset  ? Diabetes Mother   ? Hypertension Mother   ? Diabetes Father   ? Hypertension Father   ? Diabetes Paternal Aunt   ? Diabetes Paternal Grandmother   ? Anesthesia problems Neg Hx   ? Hearing loss Neg Hx   ? Other Neg Hx   ? Breast cancer Neg Hx   ? ? ?Past Surgical History:  ?Procedure Laterality Date  ? ABDOMINAL HYSTERECTOMY    ? BREAST BIOPSY Right 2013  ? FA  ? CESAREAN SECTION    ? C/S x 4  ?  UPPER GASTROINTESTINAL ENDOSCOPY  04/23/2020  ? ? ?ROS: ?Review of Systems ?Negative except as stated above ? ?PHYSICAL EXAM: ?BP 126/80   Pulse 70   Resp 16   Wt 174 lb (78.9 kg)   LMP 10/08/2011   SpO2 98%   BMI 30.82 kg/m?   ?Wt Readings from Last 3 Encounters:  ?06/24/21 174 lb (78.9 kg)  ?06/19/21 174 lb 1.6 oz (79 kg)  ?06/09/21 174 lb 3.2 oz (79 kg)  ? ? ?Physical Exam ? ?General appearance - alert, well appearing, and in no distress ?Mental status - normal mood, behavior, speech, dress, motor activity, and thought processes ?Neck - supple, no significant adenopathy ?Chest - clear to auscultation, no wheezes, rales or rhonchi, symmetric air entry ?Heart - normal rate, regular rhythm, normal S1, S2, no murmurs, rubs, clicks or gallops ?Musculoskeletal -hands: No signs of inflammation/tenosynovitis of the fingers or wrist.  No swelling of the soft tissues noted at this time.  No signs of joint enlargement ?Neuro: Gross sensation intact in both lower extremities. ? ? ?  Latest Ref Rng & Units 05/23/2021  ?  9:17 AM 04/08/2021  ?  9:40 PM 11/14/2020  ?  3:49 PM  ?CMP  ?Glucose 70 - 99 mg/dL 98   111   109    ?BUN 6 - 24 mg/dL '14   15   22    '$ ?Creatinine 0.57 - 1.00 mg/dL 0.72   0.66   0.82    ?Sodium 134 - 144 mmol/L 139   138   143    ?Potassium 3.5 - 5.2 mmol/L 4.2   3.8   3.9    ?Chloride 96 - 106 mmol/L 101   100   104    ?CO2 20 - 29 mmol/L '24   28   23    '$ ?Calcium 8.7 - 10.2 mg/dL 9.4   9.8   9.5    ?Total Protein 6.0 - 8.5 g/dL 7.3    7.4    ?Total Bilirubin 0.0 - 1.2 mg/dL 0.4    <0.2    ?Alkaline Phos 44 - 121 IU/L 75    79    ?AST 0 - 40 IU/L 18    26    ?ALT 0 - 32 IU/L 13    10    ? ?Lipid Panel  ?   ?Component Value Date/Time  ? CHOL 185 01/23/2021 1525  ? TRIG 400 (H) 01/23/2021 1525  ? HDL 34 (L) 01/23/2021 1525  ? CHOLHDL 5.4 (H) 01/23/2021 1525  ? [place] 86  01/23/2021 1525  ? ? ?CBC ?   ?Component Value Date/Time  ? WBC 7.2 05/23/2021 0917  ? WBC 8.5 04/08/2021 2140  ? RBC 4.55 05/23/2021 0917   ? RBC 4.70 04/08/2021 2140  ? HGB 12.2 05/23/2021 0917  ? HCT 37.5 05/23/2021 0917  ? PLT 226 05/23/2021 0917  ? MCV 82 05/23/2021 0917  ? MCH 26.8 05/23/2021 0917  ? MCH 26.4 04/08/2021 2140  ? MCHC 32.5 05/23/2021 0917  ? MCHC 31.6 04/08/2021 2140  ? RDW 13.8 05/23/2021 0917  ? LYMPHSABS 1.7 11/14/2020 1549  ? MONOABS 0.2 07/06/2014 1153  ? EOSABS 0.1 11/14/2020 1549  ? BASOSABS 0.0 11/14/2020 1549  ? ? ?ASSESSMENT AND PLAN: ?1. Edema of hand ?Currently I see no abnormalities of the hands to suggest inflammation or arthritis.  I recommend that she cut back on salt in the foods. ? ?2. Prediabetes ?Commended her on regular exercise and encouraged her to keep up the good work.  Commended her also on healthy eating habits and encouraged her to continue doing that as well.  Based on her A1c I do not think that we need to increase the metformin to thousand twice a day.  I told her to continue taking it as 500 twice a day. ?- metFORMIN (GLUCOPHAGE) 500 MG tablet; Take 1 tablet (500 mg total) by mouth 2 (two) times daily with a meal.  Dispense: 180 tablet; Refill: 3 ? ?3. Obesity (BMI 30.0-34.9) ?See #2 above ?In regards to her having liposuction, I told her that risk for MACE is low.  If she is determined that she wants to have the liposuction, I think she can move forward with having it. ? ?4. Elevated blood-pressure reading without diagnosis of hypertension ?DASH diet discussed and encouraged. ? ?5. Hypertriglyceridemia ?- Lipid panel ? ?6. Meralgia paresthetica, left lower limb ?Observe for now.  If it increases in frequency then we can consider putting her on gabapentin. ? ? ? ?Patient was given the opportunity to ask questions.  Patient verbalized understanding of the plan and was able to repeat key elements of the plan.  ? ?This documentation was completed using Radio producer.  Any transcriptional errors are unintentional. ? ?Orders Placed This Encounter  ?Procedures  ? Lipid panel   ? ? ? ?Requested Prescriptions  ? ?Signed Prescriptions Disp Refills  ? metFORMIN (GLUCOPHAGE) 500 MG tablet 180 tablet 3  ?  Sig: Take 1 tablet (500 mg total) by mouth 2 (two) times daily with a meal.  ? ? ?Return in abou

## 2021-06-25 LAB — LIPID PANEL
Chol/HDL Ratio: 5.9 ratio — ABNORMAL HIGH (ref 0.0–4.4)
Cholesterol, Total: 248 mg/dL — ABNORMAL HIGH (ref 100–199)
HDL: 42 mg/dL (ref >39)
LDL Chol Calc (NIH): 165 mg/dL — ABNORMAL HIGH (ref 0–99)
Triglycerides: 218 mg/dL — ABNORMAL HIGH (ref 0–149)
VLDL Cholesterol Cal: 41 mg/dL — ABNORMAL HIGH (ref 5–40)

## 2021-07-14 ENCOUNTER — Encounter: Payer: Self-pay | Admitting: Cardiology

## 2021-07-14 ENCOUNTER — Ambulatory Visit (INDEPENDENT_AMBULATORY_CARE_PROVIDER_SITE_OTHER): Payer: Self-pay | Admitting: Cardiology

## 2021-07-14 ENCOUNTER — Ambulatory Visit (INDEPENDENT_AMBULATORY_CARE_PROVIDER_SITE_OTHER): Payer: Self-pay

## 2021-07-14 VITALS — BP 122/78 | HR 75 | Ht 63.0 in | Wt 174.0 lb

## 2021-07-14 DIAGNOSIS — Z7689 Persons encountering health services in other specified circumstances: Secondary | ICD-10-CM

## 2021-07-14 DIAGNOSIS — R002 Palpitations: Secondary | ICD-10-CM

## 2021-07-14 DIAGNOSIS — E782 Mixed hyperlipidemia: Secondary | ICD-10-CM

## 2021-07-14 DIAGNOSIS — R079 Chest pain, unspecified: Secondary | ICD-10-CM

## 2021-07-14 DIAGNOSIS — R7303 Prediabetes: Secondary | ICD-10-CM

## 2021-07-14 NOTE — Progress Notes (Unsigned)
Enrolled for [employer] to mail a ZIO XT long term holter monitor to the patients address on file.  ?Requested instructions in Spanish. ? ?07/17/21 [id number] mailed to patient 07/14/21 was applied in office. ?

## 2021-07-14 NOTE — Progress Notes (Signed)
?Cardiology Office Note:   ? ?Date:  07/15/2021  ? ?ID:  Brooke Lam, DOB [birth year], MRN [id number] ? ?PCP:  Ladell Pier, MD  ?Cardiologist:  Berniece Salines, DO  ?Electrophysiologist:  None  ? ?Referring MD: Ladell Pier, MD  ? ?Chief Complaint  ?Patient presents with  ? Shortness of Breath  ? ? ?History of Present Illness:   ? ?Brooke Lam is a 41 y.o. female with a hx of prediabetes, hyperlipidemia, hypertension, here to be evaluated for shortness of breath and chest tightness.  She also notes that she has been experiencing significant palpitations.  She tells me that the palpitation initially started as abrupt onset of fast heartbeat and then goals for minutes at a time.  After this she gets significantly short of breath.  And at times there are some chest pain.  No other complaints. ? ?Past Medical History:  ?Diagnosis Date  ? Anemia   ? Blood transfusion without reported diagnosis   ? Ectopic pregnancy   ? Hypertension   ? Preterm labor   ? ? ?Past Surgical History:  ?Procedure Laterality Date  ? ABDOMINAL HYSTERECTOMY    ? BREAST BIOPSY Right 2013  ? FA  ? CESAREAN SECTION    ? C/S x 4  ? UPPER GASTROINTESTINAL ENDOSCOPY  04/23/2020  ? ? ?Current Medications: ?Current Meds  ?Medication Sig  ? metFORMIN (GLUCOPHAGE) 500 MG tablet Take 1 tablet (500 mg total) by mouth 2 (two) times daily with a meal.  ? topiramate (TOPAMAX) 50 MG tablet Take 1/2 tablet at bedtime for one week, then increase to 1 tablet at bedtime  ?  ? ?Allergies:   Patient has no known allergies.  ? ?Social History  ? ?Socioeconomic History  ? Marital status: Single  ?  Spouse name: Not on file  ? Number of children: 4  ? Years of education: Not on file  ? Highest education level: High school graduate  ?Occupational History  ? Occupation: bartender  ?Tobacco Use  ? Smoking status: Former  ?  Types: Cigarettes  ?  Quit date: 2005  ?  Years since quitting: 18.3  ? Smokeless tobacco: Never  ?Vaping Use  ? Vaping  Use: Never used  ?Substance and Sexual Activity  ? Alcohol use: Yes  ?  Alcohol/week: 0.0 standard drinks  ?  Comment: every 8 days 5-8 drinks of wine and whiskey  ? Drug use: No  ? Sexual activity: Yes  ?  Birth control/protection: None, Surgical  ?Other Topics Concern  ? Not on file  ?Social History Narrative  ? Not on file  ? ?Social Determinants of Health  ? ?Financial Resource Strain: Not on file  ?Food Insecurity: No Food Insecurity  ? Worried About Charity fundraiser in the Last Year: Never true  ? Ran Out of Food in the Last Year: Never true  ?Transportation Needs: No Transportation Needs  ? Lack of Transportation (Medical): No  ? Lack of Transportation (Non-Medical): No  ?Physical Activity: Not on file  ?Stress: Not on file  ?Social Connections: Not on file  ?  ? ?Family History: ?The patient's family history includes Diabetes in her father, mother, paternal aunt, and paternal grandmother; Hypertension in her father and mother. There is no history of Anesthesia problems, Hearing loss, Other, or Breast cancer. ? ?ROS:   ?Review of Systems  ?Constitution: Negative for decreased appetite, fever and weight gain.  ?HENT: Negative for congestion, ear discharge, hoarse voice and sore throat.   ?  Eyes: Negative for discharge, redness, vision loss in right eye and visual halos.  ?Cardiovascular: Reports chest pain and shortness of breath and palpitations.  Negative for eg swelling, orthopnea ?Respiratory: Negative for cough, hemoptysis, shortness of breath and snoring.   ?Endocrine: Negative for heat intolerance and polyphagia.  ?Hematologic/Lymphatic: Negative for bleeding problem. Does not bruise/bleed easily.  ?Skin: Negative for flushing, nail changes, rash and suspicious lesions.  ?Musculoskeletal: Negative for arthritis, joint pain, muscle cramps, myalgias, neck pain and stiffness.  ?Gastrointestinal: Negative for abdominal pain, bowel incontinence, diarrhea and excessive appetite.  ?Genitourinary: Negative  for decreased libido, genital sores and incomplete emptying.  ?Neurological: Negative for brief paralysis, focal weakness, headaches and loss of balance.  ?Psychiatric/Behavioral: Negative for altered mental status, depression and suicidal ideas.  ?Allergic/Immunologic: Negative for HIV exposure and persistent infections.  ? ? ?EKGs/Labs/Other Studies Reviewed:   ? ?The following studies were reviewed today: ? ? ?EKG:  The ekg ordered today demonstrates sinus rhythm, heart rate 75 bpm. ? ?Recent Labs: ?11/14/2020: TSH 0.802 ?05/23/2021: ALT 13; BUN 14; Creatinine, Ser 0.72; Hemoglobin 12.2; Platelets 226; Potassium 4.2; Sodium 139  ?Recent Lipid Panel ?   ?Component Value Date/Time  ? CHOL 248 (H) 06/24/2021 1419  ? TRIG 218 (H) 06/24/2021 1419  ? HDL 42 06/24/2021 1419  ? CHOLHDL 5.9 (H) 06/24/2021 1419  ? [place] 165 (H) 06/24/2021 1419  ? ? ?Physical Exam:   ? ?VS:  BP 122/78   Pulse 75   Ht '5\' 3"'$  (1.6 m)   Wt 174 lb (78.9 kg)   LMP 10/08/2011   SpO2 99%   BMI 30.82 kg/m?    ? ?Wt Readings from Last 3 Encounters:  ?07/14/21 174 lb (78.9 kg)  ?06/24/21 174 lb (78.9 kg)  ?06/19/21 174 lb 1.6 oz (79 kg)  ?  ? ?GEN: Well nourished, well developed in no acute distress ?HEENT: Normal ?NECK: No JVD; No carotid bruits ?LYMPHATICS: No lymphadenopathy ?CARDIAC: S1S2 noted,RRR, no murmurs, rubs, gallops ?RESPIRATORY:  Clear to auscultation without rales, wheezing or rhonchi  ?ABDOMEN: Soft, non-tender, non-distended, +bowel sounds, no guarding. ?EXTREMITIES: No edema, No cyanosis, no clubbing ?MUSCULOSKELETAL:  No deformity  ?SKIN: Warm and dry ?NEUROLOGIC:  Alert and oriented x 3, non-focal ?PSYCHIATRIC:  Normal affect, good insight ? ?ASSESSMENT:   ? ?1. Chest pain of uncertain etiology   ?2. Palpitations   ?3. Prediabetes   ?4. Encounter to establish care with new doctor   ?5. Mixed hyperlipidemia   ? ?PLAN:   ? ? ?I would like to rule out a cardiovascular etiology of this palpitation, therefore at this time I would  like to placed a zio patch for 14  days. In additon for shortness of breath a transthoracic echocardiogram will be ordered to assess LV/RV function and any structural abnormalities. Once these testing have been performed amd reviewed further reccomendations will be made. For now, I do reccomend that the patient goes to the nearest ED if  symptoms recur. ? ?Hyperlipidemia-she is not on any statin we will start the patient on moderate intensity statin.  Last lipid profile was done 3 weeks ago shows total cholesterol 248, triglyceride 218, LDL of 65. ? ?Prediabetes-she has been started on metformin. ? ?The patient understands the need to lose weight with diet and exercise. We have discussed specific strategies for this. ? ?The patient is in agreement with the above plan. The patient left the office in stable condition.  The patient will follow up in ? ? ?Medication  Adjustments/Labs and Tests Ordered: ?Current medicines are reviewed at length with the patient today.  Concerns regarding medicines are outlined above.  ?Orders Placed This Encounter  ?Procedures  ? LONG TERM MONITOR (3-14 DAYS)  ? EKG 12-Lead  ? ECHOCARDIOGRAM COMPLETE  ? ?No orders of the defined types were placed in this encounter. ? ? ?Patient Instructions  ?Medication Instructions:  ?Your physician recommends that you continue on your current medications as directed. Please refer to the Current Medication list given to you today.  ?*If you need a refill on your cardiac medications before your next appointment, please call your pharmacy* ? ? ?Lab Work: ?None ?If you have labs (blood work) drawn today and your tests are completely normal, you will receive your results only by: ?MyChart Message (if you have MyChart) OR ?A paper copy in the mail ?If you have any lab test that is abnormal or we need to change your treatment, we will call you to review the results. ? ? ?Testing/Procedures: ?Your physician has requested that you have an echocardiogram.  Echocardiography is a painless test that uses sound waves to create images of your heart. It provides your doctor with information about the size and shape of your heart and how well your heart?s chambers and valves ar

## 2021-07-14 NOTE — Patient Instructions (Addendum)
Medication Instructions:  ?Your physician recommends that you continue on your current medications as directed. Please refer to the Current Medication list given to you today.  ?*If you need a refill on your cardiac medications before your next appointment, please call your pharmacy* ? ? ?Lab Work: ?None ?If you have labs (blood work) drawn today and your tests are completely normal, you will receive your results only by: ?MyChart Message (if you have MyChart) OR ?A paper copy in the mail ?If you have any lab test that is abnormal or we need to change your treatment, we will call you to review the results. ? ? ?Testing/Procedures: ?Your physician has requested that you have an echocardiogram. Echocardiography is a painless test that uses sound waves to create images of your heart. It provides your doctor with information about the size and shape of your heart and how well your heart?s chambers and valves are working. This procedure takes approximately one hour. There are no restrictions for this procedure. ? ?ZIO XT- Long Term Monitor Instructions ? ?Your physician has requested you wear a ZIO patch monitor for 14 days.  ?This is a single patch monitor. [employer] supplies one patch monitor per enrollment. Additional ?stickers are not available. Please do not apply patch if you will be having a Nuclear Stress Test,  ?Echocardiogram, Cardiac CT, MRI, or Chest Xray during the period you would be wearing the  ?monitor. The patch cannot be worn during these tests. You cannot remove and re-apply the  ?ZIO XT patch monitor.  ?Your ZIO patch monitor will be mailed 3 day USPS to your address on file. It may take 3-5 days  ?to receive your monitor after you have been enrolled.  ?Once you have received your monitor, please review the enclosed instructions. Your monitor  ?has already been registered assigning a specific monitor serial # to you. ? ?Billing and Patient Assistance Program Information ? ?We have supplied [employer] with  any of your insurance information on file for billing purposes. ?[employer] offers a sliding scale Patient Assistance Program for patients that do not have  ?insurance, or whose insurance does not completely cover the cost of the ZIO monitor.  ?You must apply for the Patient Assistance Program to qualify for this discounted rate.  ?To apply, please call [employer] at [phone], select option 4, select option 2, ask to apply for  ?Patient Assistance Program. Theodore Demark will ask your household income, and how many people  ?are in your household. They will quote your out-of-pocket cost based on that information.  ?[employer] will also be able to set up a 63-month interest-free payment plan if needed. ? ?Applying the monitor ?  ?Shave hair from upper left chest.  ?Hold abrader disc by orange tab. Rub abrader in 40 strokes over the upper left chest as  ?indicated in your monitor instructions.  ?Clean area with 4 enclosed alcohol pads. Let dry.  ?Apply patch as indicated in monitor instructions. Patch will be placed under collarbone on left  ?side of chest with arrow pointing upward.  ?Rub patch adhesive wings for 2 minutes. Remove white label marked "1". Remove the white  ?label marked "2". Rub patch adhesive wings for 2 additional minutes.  ?While looking in a mirror, press and release button in center of patch. A small green light will  ?flash 3-4 times. This will be your only indicator that the monitor has been turned on.  ?Do not shower for the first 24 hours. You may shower after the first  24 hours.  ?Press the button if you feel a symptom. You will hear a small click. Record Date, Time and  ?Symptom in the Patient Logbook.  ?When you are ready to remove the patch, follow instructions on the last 2 pages of Patient  ?Logbook. Stick patch monitor onto the last page of Patient Logbook.  ?Place Patient Logbook in the blue and white box. Use locking tab on box and tape box closed  ?securely. The blue and white box has prepaid  postage on it. Please place it in the mailbox as  ?soon as possible. Your physician should have your test results approximately 7 days after the  ?monitor has been mailed back to [facility].  ?Call [facility] at [phone] if you have questions regarding  ?your ZIO XT patch monitor. Call them immediately if you see an orange light blinking on your  ?monitor.  ?If your monitor falls off in less than 4 days, contact our Monitor department at [phone].  ?If your monitor becomes loose or falls off after 4 days call [employer] at [phone] for  ?suggestions on securing your monitor ? ? ? ?Follow-Up: ?At [facility], you and your health needs are our priority.  As part of our continuing mission to provide you with exceptional heart care, we have created designated Provider Care Teams.  These Care Teams include your primary Cardiologist (physician) and Advanced Practice Providers (APPs -  Physician Assistants and Nurse Practitioners) who all work together to provide you with the care you need, when you need it. ? ?We recommend signing up for the patient portal called "MyChart".  Sign up information is provided on this After Visit Summary.  MyChart is used to connect with patients for Virtual Visits (Telemedicine).  Patients are able to view lab/test results, encounter notes, upcoming appointments, etc.  Non-urgent messages can be sent to your provider as well.   ?To learn more about what you can do with MyChart, go to [email or web address].   ? ?Your next appointment:   ?6 month(s) ? ?The format for your next appointment:   ?In Person ? ?Provider:   ?Berniece Salines, DO   ? ? ?Other Instructions ? ? ?Important Information About Sugar ? ? ? ? ?  ?

## 2021-07-15 DIAGNOSIS — R7303 Prediabetes: Secondary | ICD-10-CM | POA: Insufficient documentation

## 2021-07-15 DIAGNOSIS — E782 Mixed hyperlipidemia: Secondary | ICD-10-CM | POA: Insufficient documentation

## 2021-07-16 ENCOUNTER — Telehealth: Payer: Self-pay | Admitting: Cardiology

## 2021-07-16 NOTE — Telephone Encounter (Signed)
Patient has been called and made aware that we will be back in touch to set up a time to have her come in. ?

## 2021-07-16 NOTE — Telephone Encounter (Signed)
Pt states that she received her monitor in the mail for her to wear for 14 days but she needs help to apply it. Pt states that she does not live far from the office so she would be able to come in any time is someone can help her. Please advise ?

## 2021-07-17 ENCOUNTER — Telehealth: Payer: Self-pay

## 2021-07-17 ENCOUNTER — Other Ambulatory Visit: Payer: Self-pay

## 2021-07-17 DIAGNOSIS — R7303 Prediabetes: Secondary | ICD-10-CM

## 2021-07-17 DIAGNOSIS — E669 Obesity, unspecified: Secondary | ICD-10-CM

## 2021-07-17 DIAGNOSIS — R002 Palpitations: Secondary | ICD-10-CM

## 2021-07-17 MED ORDER — ROSUVASTATIN CALCIUM 10 MG PO TABS: 10.0000 mg | ORAL_TABLET | Freq: Every day | ORAL | 3 refills | Status: DC

## 2021-07-17 NOTE — Telephone Encounter (Signed)
Copied from [place] [id number]. Topic: Referral - Request for Referral ?>> Jul 17, 2021 10:44 AM Erick Blinks wrote: ?Has patient seen PCP for this complaint? Yes.   ?*If NO, is insurance requiring patient see PCP for this issue before PCP can refer them? ?Referral for which specialty: Nutrition  ?Preferred provider/office: [phone] ?Reason for referral: Needs diet planning, pre-diabetic ?

## 2021-07-17 NOTE — Telephone Encounter (Signed)
Patient scheduled to come to [facility] office and have her monitor applied on Thursday, 07/17/21, 10:00 AM. ?

## 2021-07-17 NOTE — Progress Notes (Signed)
Called patient. Patient made aware of her lab results and Dr. Terrial Rhodes recommendation to start Crestor 100 mg once daily. She verbalized understanding. No questions or concerns expressed at this time. Prescription sent to pharmacy.  ?  ? ?

## 2021-07-17 NOTE — Addendum Note (Signed)
Addended by: [name] B on: 07/17/2021 01:43 PM ? ? Modules accepted: Orders ? ?

## 2021-07-18 NOTE — Telephone Encounter (Signed)
Called and pt is aware of note ? ?Interpreter FQ#[id number] ?

## 2021-07-28 ENCOUNTER — Emergency Department (HOSPITAL_BASED_OUTPATIENT_CLINIC_OR_DEPARTMENT_OTHER): Admit: 2021-07-28 | Discharge: 2021-07-28 | Disposition: A | Discharge status: Home / Self Care | Payer: Self-pay

## 2021-07-28 ENCOUNTER — Encounter (HOSPITAL_COMMUNITY): Payer: Self-pay | Admitting: Emergency Medicine

## 2021-07-28 ENCOUNTER — Emergency Department (HOSPITAL_COMMUNITY)
Admission: EM | Admit: 2021-07-28 | Discharge: 2021-07-28 | Disposition: A | Discharge status: Home / Self Care | Payer: No Typology Code available for payment source | Attending: Emergency Medicine | Admitting: Emergency Medicine

## 2021-07-28 ENCOUNTER — Ambulatory Visit: Payer: Self-pay

## 2021-07-28 DIAGNOSIS — M7989 Other specified soft tissue disorders: Secondary | ICD-10-CM

## 2021-07-28 DIAGNOSIS — D649 Anemia, unspecified: Secondary | ICD-10-CM | POA: Insufficient documentation

## 2021-07-28 DIAGNOSIS — R2242 Localized swelling, mass and lump, left lower limb: Secondary | ICD-10-CM | POA: Insufficient documentation

## 2021-07-28 DIAGNOSIS — R0602 Shortness of breath: Secondary | ICD-10-CM | POA: Insufficient documentation

## 2021-07-28 LAB — CBC WITH DIFFERENTIAL/PLATELET
Abs Immature Granulocytes: 0.02 10*3/uL (ref 0.00–0.07)
Basophils Absolute: 0 10*3/uL (ref 0.0–0.1)
Basophils Relative: 0 %
Eosinophils Absolute: 0.2 10*3/uL (ref 0.0–0.5)
Eosinophils Relative: 3 %
HCT: 36 % (ref 36.0–46.0)
Hemoglobin: 11.7 g/dL — ABNORMAL LOW (ref 12.0–15.0)
Immature Granulocytes: 0 %
Lymphocytes Relative: 33 %
Lymphs Abs: 2.2 10*3/uL (ref 0.7–4.0)
MCH: 27.8 pg (ref 26.0–34.0)
MCHC: 32.5 g/dL (ref 30.0–36.0)
MCV: 85.5 fL (ref 80.0–100.0)
Monocytes Absolute: 0.4 10*3/uL (ref 0.1–1.0)
Monocytes Relative: 6 %
Neutro Abs: 3.7 10*3/uL (ref 1.7–7.7)
Neutrophils Relative %: 58 %
Platelets: 245 10*3/uL (ref 150–400)
RBC: 4.21 MIL/uL (ref 3.87–5.11)
RDW: 13.8 % (ref 11.5–15.5)
WBC: 6.5 10*3/uL (ref 4.0–10.5)
nRBC: 0 % (ref 0.0–0.2)

## 2021-07-28 LAB — BASIC METABOLIC PANEL
Anion gap: 8 (ref 5–15)
BUN: 10 mg/dL (ref 6–20)
CO2: 26 mmol/L (ref 22–32)
Calcium: 9.2 mg/dL (ref 8.9–10.3)
Chloride: 104 mmol/L (ref 98–111)
Creatinine, Ser: 0.61 mg/dL (ref 0.44–1.00)
GFR, Estimated: >60 mL/min (ref >60)
Glucose, Bld: 100 mg/dL — ABNORMAL HIGH (ref 70–99)
Potassium: 3.5 mmol/L (ref 3.5–5.1)
Sodium: 138 mmol/L (ref 135–145)

## 2021-07-28 NOTE — Discharge Instructions (Addendum)
Please return to the ED if you begin to have chest pain, worsening shortness of breath, worsening leg swelling or pain. It was a pleasure taking care of you today. ?

## 2021-07-28 NOTE — ED Provider Notes (Addendum)
?[place] [facility] ?Provider Note ? ? ?CSN: [id number] ?Arrival date & time: 07/28/21  1640 ? ?  ? ?History ? ?Chief Complaint  ?Patient presents with  ? Leg Swelling  ? ? ?Brooke Lam is a 41 y.o. female with no significant past medical history who presents with concern for left leg, ankle swelling, cramps since yesterday.  Was sent by her PCP to evaluate for possible blood clot.  She denies any chest pain, hemoptysis, recent travel, history of blood clots.  She reports that she had an issue with elevated heart rate and has been to see a cardiologist, has follow-up on Thursday for echo and evaluation of the Holter monitor when she was wearing.  Leg cramping somewhat improved today compared to yesterday.  Patient does report that she has been slow time on her feet, reports that she has had some increased shortness of breath, or increased tiredness over the past month.  She denies history of diabetes but does endorse prediabetes.  She denies tobacco abuse, previous history of ACS, previous stroke, hypertension, does endorse hyperlipidemia. ? ?HPI ? ?  ? ?Home Medications ?Prior to Admission medications   ?Medication Sig Start Date End Date Taking? Authorizing Provider  ?metFORMIN (GLUCOPHAGE) 500 MG tablet Take 1 tablet (500 mg total) by mouth 2 (two) times daily with a meal. 06/24/21   Ladell Pier, MD  ?rosuvastatin (CRESTOR) 10 MG tablet Take 1 tablet (10 mg total) by mouth daily. 07/17/21   Tobb, Godfrey Pick, DO  ?topiramate (TOPAMAX) 50 MG tablet Take 1/2 tablet at bedtime for one week, then increase to 1 tablet at bedtime 02/17/21   Pieter Partridge, DO  ?   ? ?Allergies    ?Patient has no known allergies.   ? ?Review of Systems   ?Review of Systems  ?Cardiovascular:  Positive for leg swelling.  ?Musculoskeletal:  Positive for myalgias.  ?All other systems reviewed and are negative. ? ?Physical Exam ?Updated Vital Signs ?BP (!) 148/96 (BP Location: Right Arm)   Pulse 84    Temp 98.6 ?F (37 ?C) (Oral)   Resp 18   Ht '5\' 3"'$  (1.6 m)   Wt 79 kg   LMP 10/08/2011   SpO2 99%   BMI 30.85 kg/m?  ?Physical Exam ?Vitals and nursing note reviewed.  ?Constitutional:   ?   General: She is not in acute distress. ?   Appearance: Normal appearance.  ?HENT:  ?   Head: Normocephalic and atraumatic.  ?Eyes:  ?   General:     ?   Right eye: No discharge.     ?   Left eye: No discharge.  ?Cardiovascular:  ?   Rate and Rhythm: Normal rate and regular rhythm.  ?Pulmonary:  ?   Effort: Pulmonary effort is normal. No respiratory distress.  ?Musculoskeletal:     ?   General: No deformity.  ?   Comments: Possible 1+ bilaterally, I do not note any left compared to right discrepancy on my exam.  Negative Thompson sign.  ?Skin: ?   General: Skin is warm and dry.  ?Neurological:  ?   Mental Status: She is alert and oriented to person, place, and time.  ?Psychiatric:     ?   Mood and Affect: Mood normal.     ?   Behavior: Behavior normal.  ? ? ?ED Results / Procedures / Treatments   ?Labs ?(all labs ordered are listed, but only abnormal results are displayed) ?Labs Reviewed  ?  BASIC METABOLIC PANEL - Abnormal; Notable for the following components:  ?    Result Value  ? Glucose, Bld 100 (*)   ? All other components within normal limits  ?CBC WITH DIFFERENTIAL/PLATELET - Abnormal; Notable for the following components:  ? Hemoglobin 11.7 (*)   ? All other components within normal limits  ? ? ?EKG ?None ? ?Radiology ?No results found. ? ?Procedures ?Procedures  ? ? ?Medications Ordered in ED ?Medications - No data to display ? ?ED Course/ Medical Decision Making/ A&P ?Clinical Course as of 07/28/21 1858  ?Mon Jul 28, 2021  ?1838 Negative on left for DVT [CP]  ?  ?Clinical Course User Index ?[CP] Katlyn Muldrew H, PA-C  ? ?                        ?Medical Decision Making ? ?This patient presents to the ED for concern of left leg edema, cramping, associated with shortness of breath, as well as elevated heart rate  over the last month, this involves an extensive number of treatment options, and is a complaint that carries with it a high risk of complications and morbidity. The emergent differential diagnosis prior to evaluation includes, but is not limited to, acute DVT, leg swelling related to cardiac or kidney problem, cellulitis versus other..  ? ?This is not an exhaustive differential.  ? ?Past Medical History / Co-morbidities / Social History: ?Hyperlipidemia, mixed, prediabetes ? ?Additional history: ?Chart reviewed. Pertinent results include: Reviewed outpatient PCP, OB/GYN, cardiology visits including lab work, imaging ? ?Physical Exam: ?Physical exam performed. The pertinent findings include: Some possible swelling of bilateral lower extremities, I do not appreciate a left compared to right discrepancies ? ?Lab Tests: ?I ordered, and personally interpreted labs.  The pertinent results include: CBC shows very mild anemia, BMP is unremarkable.  No evidence of acute kidney injury.  Based on patient's story, and close cardiac follow-up do not think she needs acute work-up for BNP, troponin based on story, no evidence of chest pain. ?  ?Imaging Studies: ?I ordered imaging studies including vascular ultrasound of left lower extremity. I independently visualized and interpreted imaging which showed no evidence of DVT. I agree with the radiologist interpretation. ?  ?Disposition: ?After consideration of the diagnostic results and the patients response to treatment, I feel that patient presentation shows no evidence of acute DVT, is unclear what is causing her leg swelling, general tiredness, however she does have close cardiac follow-up to discuss results of Holter monitor, and receive echo on Thursday.  Do not think she needs any other acute follow-up in the emergency department today.  Encouraged return precautions if new chest pain, worsening shortness of breath, worsening leg swelling. ? ?I discussed this case with my  attending physician Dr. Roslynn Amble who cosigned this note including patient's presenting symptoms, physical exam, and planned diagnostics and interventions. Attending physician stated agreement with plan or made changes to plan which were implemented.   ? ?Final Clinical Impression(s) / ED Diagnoses ?Final diagnoses:  ?Leg swelling  ? ? ?Rx / DC Orders ?ED Discharge Orders   ? ? None  ? ?  ? ? ?  ?Anselmo Pickler, PA-C ?07/28/21 1857 ? ?  ?Anselmo Pickler, PA-C ?07/28/21 1859 ? ?  ?Lucrezia Starch, MD ?07/28/21 2200 ? ?

## 2021-07-28 NOTE — ED Triage Notes (Signed)
Pt reports left leg/ankle swelling since yesterday am. Pt also had leg cramps yesterday. Denies CP.  ?

## 2021-07-28 NOTE — Telephone Encounter (Signed)
?  Chief Complaint: bilateral lower leg swelling left greater than right leg ?Symptoms: cramp to the top of her foot, and swelling, hand swelling, headache ?Frequency: since yesterday ?Pertinent Negatives: Patient denies chest pain, SOB ?Disposition: '[x]'$ ED /'[]'$ Urgent Care (no appt availability in office) / '[]'$ Appointment(In office/virtual)/ '[]'$  [facility]/ '[]'$ Home Care/ '[]'$ Refused Recommended Disposition /'[]'$ [facility]/ '[]'$  Follow-up with PCP ?Additional Notes: pt going to [facility] ED ? ? ? ?Reason for Disposition ? [1] Thigh, calf, or ankle swelling AND [2] bilateral AND [3] 1 side is more swollen ? ?Answer Assessment - Initial Assessment Questions ?1. ONSET: "When did the swelling start?" (e.g., minutes, hours, days) ?    yesterday ?2. LOCATION: "What part of the leg is swollen?"  "Are both legs swollen or just one leg?" ?    Knees down left worse than right ?3. SEVERITY: "How bad is the swelling?" (e.g., localized; mild, moderate, severe) ? - Localized - small area of swelling localized to one leg ? - MILD pedal edema - swelling limited to foot and ankle, pitting edema < 1/4 inch (6 mm) deep, rest and elevation eliminate most or all swelling ? - MODERATE edema - swelling of lower leg to knee, pitting edema > 1/4 inch (6 mm) deep, rest and elevation only partially reduce swelling ? - SEVERE edema - swelling extends above knee, facial or hand swelling present  ?    moderate ?4. REDNESS: "Does the swelling look red or infected?" ?    no ?5. PAIN: "Is the swelling painful to touch?" If Yes, ask: "How painful is it?"   (Scale 1-10; mild, moderate or severe) ?    no ?6. FEVER: "Do you have a fever?" If Yes, ask: "What is it, how was it measured, and when did it start?"  ?    no ?7. CAUSE: "What do you think is causing the leg swelling?" ?    Does not know ?8. MEDICAL HISTORY: "Do you have a history of heart failure, kidney disease, liver failure, or cancer?" ?    no ?9. RECURRENT SYMPTOM: "Have you had  leg swelling before?" If Yes, ask: "When was the last time?" "What happened that time?" ?    Yes with prolonged standing ?10. OTHER SYMPTOMS: "Do you have any other symptoms?" (e.g., chest pain, difficulty breathing) ?       hand swelling, headache, cramp to top of left  ?11. PREGNANCY: "Is there any chance you are pregnant?" "When was your last menstrual period?" ?      N/a ? ?Protocols used: Leg Swelling and Edema-A-AH ? ?

## 2021-07-28 NOTE — Progress Notes (Signed)
Left lower extremity venous duplex has been completed. ?Preliminary results can be found in CV Proc through chart review.  ?Results were given to [facility] PA. ? ?07/28/21 6:57 PM ?Carlos Levering RVT   ?

## 2021-07-28 NOTE — ED Notes (Signed)
Patient in triage room for vasc US ?

## 2021-07-28 NOTE — ED Provider Triage Note (Signed)
Emergency Medicine Provider Triage Evaluation Note ? ?Brooke Lam , a 41 y.o. female  was evaluated in triage.  Pt complains of swelling and cramping to left calf.  Patient reports that she has had symptoms since yesterday morning.  Patient was advised to come to the [facility] by her PCP to rule out possible DVT. ? ?Patient denies any recent falls or traumatic injuries.  Denies any chest pain, shortness of breath, hemoptysis, numbness, weakness. ? ?Review of Systems  ?Positive: See above ?Negative: See above ? ?Physical Exam  ?BP (!) 148/96 (BP Location: Right Arm)   Pulse 84   Temp 98.6 ?F (37 ?C) (Oral)   Resp 18   Ht '5\' 3"'$  (1.6 m)   Wt 79 kg   LMP 10/08/2011   SpO2 99%   BMI 30.85 kg/m?  ?Gen:   Awake, no distress   ?Resp:  Normal effort  ?MSK:   Moves extremities without difficulty  ?Other:  Minimal swelling to left lower extremity.  No tenderness to left lower extremity.  +2 left DP pulse.   ? ?No swelling or tenderness noted to right lower extremity. ? ?Medical Decision Making  ?Medically screening exam initiated at 4:59 PM.  Appropriate orders placed.  Brooke Lam was informed that the remainder of the evaluation will be completed by another provider, this initial triage assessment does not replace that evaluation, and the importance of remaining in the ED until their evaluation is complete. ? ? ?  ?Loni Beckwith, PA-C ?07/28/21 1700 ? ?

## 2021-07-31 ENCOUNTER — Other Ambulatory Visit (HOSPITAL_COMMUNITY): Payer: No Typology Code available for payment source

## 2021-08-08 ENCOUNTER — Ambulatory Visit (HOSPITAL_COMMUNITY): Payer: Self-pay | Attending: Cardiology

## 2021-08-08 DIAGNOSIS — R079 Chest pain, unspecified: Secondary | ICD-10-CM | POA: Insufficient documentation

## 2021-08-08 LAB — ECHOCARDIOGRAM COMPLETE
Area-P 1/2: 4.6 cm2
S' Lateral: 2.8 cm
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
## 2021-08-21 ENCOUNTER — Ambulatory Visit: Payer: Self-pay | Admitting: Neurology

## 2021-08-21 ENCOUNTER — Encounter: Payer: Self-pay | Admitting: Neurology

## 2021-08-21 DIAGNOSIS — Z029 Encounter for administrative examinations, unspecified: Secondary | ICD-10-CM

## 2021-09-19 ENCOUNTER — Ambulatory Visit: Payer: No Typology Code available for payment source | Admitting: Dietician
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
## 2021-09-29 ENCOUNTER — Ambulatory Visit: Payer: Self-pay | Admitting: Neurology

## 2021-09-29 ENCOUNTER — Encounter: Payer: Self-pay | Admitting: Neurology

## 2021-09-29 DIAGNOSIS — Z029 Encounter for administrative examinations, unspecified: Secondary | ICD-10-CM

## 2021-09-30 ENCOUNTER — Ambulatory Visit (INDEPENDENT_AMBULATORY_CARE_PROVIDER_SITE_OTHER): Payer: Self-pay

## 2021-09-30 ENCOUNTER — Other Ambulatory Visit (HOSPITAL_COMMUNITY)
Admission: RE | Admit: 2021-09-30 | Discharge: 2021-09-30 | Disposition: A | Discharge status: Home / Self Care | Payer: Self-pay | Source: Ambulatory Visit | Attending: Obstetrics & Gynecology | Admitting: Obstetrics & Gynecology

## 2021-09-30 VITALS — BP 118/77 | HR 69 | Ht 63.0 in | Wt 177.0 lb

## 2021-09-30 DIAGNOSIS — N898 Other specified noninflammatory disorders of vagina: Secondary | ICD-10-CM | POA: Insufficient documentation

## 2021-09-30 NOTE — Progress Notes (Signed)
SUBJECTIVE:  41 y.o. female complains of white vaginal discharge, itching for 5 day(s). Denies abnormal vaginal bleeding or significant pelvic pain or fever. No UTI symptoms. Denies history of known exposure to STD.  Patient's last menstrual period was 10/08/2011.  OBJECTIVE:  She appears well, afebrile. Urine dipstick: not done.  ASSESSMENT:  Vaginal Discharge  Vaginal Odor   PLAN:  GC, chlamydia, trichomonas, BVAG, CVAG probe sent to lab. Treatment: To be determined once lab results are received ROV prn if symptoms persist or worsen.

## 2021-10-01 LAB — CERVICOVAGINAL ANCILLARY ONLY
Bacterial Vaginitis (gardnerella): POSITIVE — AB
Candida Glabrata: NEGATIVE
Candida Vaginitis: NEGATIVE
Chlamydia: NEGATIVE
Comment: NEGATIVE
Comment: NEGATIVE
Comment: NEGATIVE
Comment: NEGATIVE
Comment: NEGATIVE
Comment: NORMAL
Neisseria Gonorrhea: NEGATIVE
Trichomonas: NEGATIVE

## 2021-10-02 ENCOUNTER — Telehealth: Payer: Self-pay | Admitting: Obstetrics & Gynecology

## 2021-10-02 MED ORDER — METRONIDAZOLE 500 MG PO TABS: 500.0000 mg | ORAL_TABLET | Freq: Two times a day (BID) | ORAL | 0 refills | Status: AC

## 2021-10-02 MED ORDER — FLUCONAZOLE 150 MG PO TABS: 150.0000 mg | ORAL_TABLET | Freq: Once | ORAL | 0 refills | Status: AC

## 2021-10-02 NOTE — Telephone Encounter (Signed)
Patient called, she noticed her lab results on MyChart and wanted to see if her prescriptions have been sent in yet.   Routing Rx to pharmacy per protocol.

## 2021-10-14 NOTE — Progress Notes (Signed)
Patient was assessed and managed by nursing staff during this encounter. I have reviewed the chart and agree with the documentation and plan. I have also made any necessary editorial changes.  Emeterio Reeve, MD 10/14/2021 2:52 PM

## 2021-10-15 ENCOUNTER — Ambulatory Visit: Payer: No Typology Code available for payment source | Admitting: Obstetrics and Gynecology

## 2021-11-11 ENCOUNTER — Telehealth: Payer: Self-pay | Admitting: Plastic Surgery

## 2021-11-11 NOTE — Telephone Encounter (Signed)
Patient called to request update on prior appointment; stated she was told at the last appointment she would be notified about whether she needs a small or a large surgery. Patient still waiting for follow up call. Also needs to know if she needs to contact the insurance company. Patient stated she gets the same information each time she calls but no one has called her back. Patient requesting call back with an interpreter.  Please advise at [phone].

## 2021-12-04 ENCOUNTER — Ambulatory Visit: Payer: Self-pay | Attending: Physician Assistant | Admitting: Physician Assistant

## 2021-12-04 ENCOUNTER — Other Ambulatory Visit: Payer: Self-pay

## 2021-12-04 ENCOUNTER — Encounter: Payer: Self-pay | Admitting: Physician Assistant

## 2021-12-04 VITALS — BP 113/76 | HR 91 | Wt 181.3 lb

## 2021-12-04 DIAGNOSIS — Z789 Other specified health status: Secondary | ICD-10-CM

## 2021-12-04 DIAGNOSIS — Z603 Acculturation difficulty: Secondary | ICD-10-CM

## 2021-12-04 DIAGNOSIS — M5432 Sciatica, left side: Secondary | ICD-10-CM

## 2021-12-04 DIAGNOSIS — D179 Benign lipomatous neoplasm, unspecified: Secondary | ICD-10-CM

## 2021-12-04 DIAGNOSIS — R7303 Prediabetes: Secondary | ICD-10-CM

## 2021-12-04 LAB — POCT GLYCOSYLATED HEMOGLOBIN (HGB A1C): HbA1c, POC (controlled diabetic range): 6 % (ref 0.0–7.0)

## 2021-12-04 LAB — GLUCOSE, POCT (MANUAL RESULT ENTRY): POC Glucose: 114 mg/dl — AB (ref 70–99)

## 2021-12-04 MED ORDER — METHOCARBAMOL 500 MG PO TABS
1000.0000 mg | ORAL_TABLET | Freq: Three times a day (TID) | ORAL | 0 refills | Status: AC | PRN
  Filled 2021-12-04 (×2): qty 90, 15d supply, fill #0

## 2021-12-04 MED ORDER — IBUPROFEN 600 MG PO TABS
ORAL_TABLET | ORAL | 0 refills | Status: DC
  Filled 2021-12-04: qty 60, 20d supply, fill #0

## 2021-12-04 NOTE — Progress Notes (Signed)
Patient ID: Brooke Lam, female   DOB: [birth year], 41 y.o.   MRN: [id number]     Sheriece Jefcoat, is a 41 y.o. female  [id number]  [id number]  DOB - 06/22/1980  Chief Complaint  Patient presents with   Leg Pain       Subjective:   Brooke Lam is a 41 y.o. female here today Several months L leg and L thigh gets numb and itchy,  sometimes goes away with elevation or movement.   NKI.  No weakness.  Hasn't taken anything for it.  Compliant with medications.  No urinary s/sx.    Has 2 small lumps on lower back   No problems updated.  ALLERGIES: No Known Allergies  PAST MEDICAL HISTORY: Past Medical History:  Diagnosis Date   Anemia    Blood transfusion without reported diagnosis    Ectopic pregnancy    Hypertension    Preterm labor     MEDICATIONS AT HOME: Prior to Admission medications   Medication Sig Start Date End Date Taking? Authorizing Provider  ibuprofen (ADVIL) 600 MG tablet Take 1 tablet three times daily X 1 week then prn pain 12/04/21  Yes Freeman Caldron M, PA-C  metFORMIN (GLUCOPHAGE) 500 MG tablet Take 1 tablet (500 mg total) by mouth 2 (two) times daily with a meal. 06/24/21  Yes Ladell Pier, MD  methocarbamol (ROBAXIN) 500 MG tablet 2 tabs 3 times daily X 1 week then prn muscle spasm 12/04/21  Yes Freeman Caldron M, PA-C  rosuvastatin (CRESTOR) 10 MG tablet Take 1 tablet (10 mg total) by mouth daily. 07/17/21  Yes Tobb, Kardie, DO  topiramate (TOPAMAX) 50 MG tablet Take 1/2 tablet at bedtime for one week, then increase to 1 tablet at bedtime Patient not taking: Reported on 12/04/2021 02/17/21   Metta Clines R, DO    ROS: Neg HEENT Neg resp Neg cardiac Neg GI Neg psych Neg neuro  Objective:   Vitals:   12/04/21 1423  BP: 113/76  Pulse: 91  SpO2: 96%  Weight: 181 lb 4.8 oz (82.2 kg)   Exam General appearance : Awake, alert, not in any distress. Speech Clear. Not toxic looking HEENT: Atraumatic and  Normocephalic Neck: Supple, no JVD. No cervical lymphadenopathy. No virchows nodes or masses Chest: Good air entry bilaterally, CTAB.  No rales/rhonchi/wheezing CVS: S1 S2 regular, no murmurs.  Lower back-small lipoma X2  Back neg SLR B, full S&ROM of BLE.  DTR absent and =B Extremities: B/L Lower Ext shows no edema, both legs are warm to touch Neurology: Awake alert, and oriented X 3, CN II-XII intact, Non focal Skin: No Rash  Data Review Lab Results  Component Value Date   HGBA1C 6.0 12/04/2021   HGBA1C 6.1 (H) 05/23/2021   HGBA1C 6.0 (H) 01/23/2021    Assessment & Plan   1. Prediabetes Continue metformin(has RF) - Glucose (CBG) - HgB A1c  2. Sciatica of left side - ibuprofen (ADVIL) 600 MG tablet; Take 1 tablet three times daily X 1 week then prn pain  Dispense: 60 tablet; Refill: 0 - methocarbamol (ROBAXIN) 500 MG tablet; 2 tabs 3 times daily X 1 week then prn muscle spasm  Dispense: 90 tablet; Refill: 0  3. Lipoma, unspecified site - CBC with Differential/Platelet - Ambulatory referral to General Surgery  4. Language barrier [facility] "Allena Katz" interpreters used and additional time performing visit was required.  Additional time with additional questions and answers   Return in about 4 months (around 04/05/2022) for  PCP for chronic conditions.  The patient was given clear instructions to go to ER or return to medical center if symptoms don't improve, worsen or new problems develop. The patient verbalized understanding. The patient was told to call to get lab results if they haven't heard anything in the next week.      Freeman Caldron, PA-C [facility] and [place], [place]   12/04/2021, 2:36 PM

## 2021-12-04 NOTE — Patient Instructions (Signed)
Lipoma Lipoma  Un lipoma es un tumor no canceroso (benigno) formado por clulas de grasa. Es un tipo muy frecuente de [employer] tejidos blandos. Por lo general, los lipomas se encuentran debajo de la piel (subcutneos). Pueden aparecer en cualquier tejido del cuerpo que contenga grasa. Las zonas en las que los lipomas aparecen con mayor frecuencia incluyen la espalda, los [place], los hombros, las nalgas y los muslos. Los lipomas crecen lentamente y, en general, son indoloros. La mayora de los lipomas no causan problemas y no requieren Clinical research associate. Cules son las causas? Se desconoce la causa de esta afeccin. Qu incrementa el riesgo? Es ms probable que sufra esta afeccin si: Tiene entre 74 y 26 aos de edad. Tiene antecedentes familiares de lipomas. Cules son los signos o sntomas? Por lo general, el lipoma aparece como una pequea protuberancia redonda debajo de la piel. En la [employer], el bulto tiene las siguientes caractersticas: Se siente suave o elstico. No causa dolor ni otros sntomas. Sin embargo, si el lipoma se encuentra en un rea en la que hace presin [employer] nervios, puede causar dolor u otros sntomas. Cmo se diagnostica? Por lo general, el lipoma puede diagnosticarse con un examen fsico. Tambin pueden hacerle estudios para confirmar el diagnstico y Paramedic otras afecciones. Las pruebas pueden incluir las siguientes: Pruebas de diagnstico por imgenes, como una exploracin por tomografa computarizada (TC) o una resonancia magntica (RM). Extraccin de [place] de tejido para analizar con un microscopio (biopsia). Cmo se trata? El tratamiento de esta afeccin depende del tamao del lipoma y si causa algn sntoma. Los lipomas pequeos que no causan problemas no requieren tratamiento. Si un lipoma se agranda o causa problemas, puede realizarse [place] para extirpar el lipoma. Los lipomas tambin pueden extirparse para mejorar el  aspecto. En la [employer], [place] procedimiento se realiza despus de aplicar un medicamento que adormece el rea (anestesia local). Pueden realizarse una liposuccin para reducir el tamao del lipoma antes de que se lo extraigan quirrgicamente, o bien se la puede realizar para extirpar el lipoma. Los lipomas se extirpan con este mtodo para limitar el tamao de la incisin y las cicatrices. Se introduce un tubo de liposuccin a travs de una pequea incisin en el lipoma y el contenido del lipoma se extrae a travs del tubo mediante succin. Siga estas indicaciones en su casa: Controle el lipoma para detectar cambios. Concurra a [place]. Esto es importante. Dnde obtener ms informacin OrthoInfo: [email or web address].org Comunquese con un mdico si: El lipoma se agranda o se endurece. El lipoma comienza a causarle dolor, se enrojece o se hincha cada vez ms. Estos podran ser signos de infeccin o de una afeccin ms grave. Solicite ayuda de inmediato si: Siente hormigueo o adormecimiento en el rea cerca del lipoma. Esto podra indicar que el lipoma es lo que causa dao nervioso. Resumen Un lipoma es un tumor no canceroso formado por clulas de grasa. La mayora de los lipomas no causan problemas y no requieren Clinical research associate. Si un lipoma se agranda o causa problemas, puede realizarse [place] para extirpar el lipoma. Comunquese con un mdico si el lipoma se agranda o se endurece, o si empieza a dolor, se pone de color rojo o se hincha cada vez ms. Estos podran ser signos de infeccin o de una afeccin ms grave. Esta informacin no tiene Marine scientist el consejo del mdico. Asegrese de hacerle al mdico cualquier pregunta que tenga. Document  Revised: 04/23/2021 Document Reviewed: 04/23/2021 Elsevier Patient Education  [place] [employer]  La citica es el [place], debilidad, hormigueo o prdida de la sensibilidad (adormecimiento) a lo  largo del nervio citico. El nervio citico comienza en la parte inferior de la espalda y desciende por la parte posterior de cada pierna. La citica suele afectar un lado del cuerpo. Suele desaparecer por s sola o con tratamiento. A veces, la citica puede volver a manifestarse. Cules son las causas? Esta afeccin se produce cuando el nervio citico se comprime o se ejerce presin sobre l. Las causas de esto pueden ser las siguientes: Un disco que sobresale demasiado entre los huesos de la [place]na vertebral (hernia de disco). Cambios en los discos vertebrales debido al envejecimiento. Una afeccin en un msculo de las nalgas. Un crecimiento seo adicional cerca del nervio citico. Una rotura (fractura) de la zona que est entre los huesos de la cadera (pelvis). Embarazo. Tumor. Esto es poco frecuente. Qu incrementa el riesgo? Es ms probable que tengan esta afeccin las personas que: Therapist, occupational deportes que ponen presin o tensin sobre la columna vertebral. Tienen poca fuerza y facilidad de movimiento (flexibilidad). Han tenido una lesin en la espalda o una ciruga de espalda. Permanecen sentadas durante largos perodos. Realizan actividades que implican agacharse o levantar objetos una y [place]. Tienen mucho sobrepeso (son obesas). Cules son los signos o sntomas? Los sntomas pueden variar de leves a muy graves. Pueden incluir los siguientes: Cualquiera de los siguientes problemas en la parte inferior de la espalda, piernas, cadera o nalgas: Hormigueo leve, prdida de la sensibilidad o dolor sordo. Sensacin de ardor. Dolor agudo. Prdida de la sensibilidad en la parte posterior de la pantorrilla o la planta del pie. Debilidad en las piernas. Dolor muy intenso en la espalda que dificulta el movimiento. Estos sntomas pueden empeorar al toser, Brewing technologist o rer. Tambin pueden empeorar al sentarse o estar de pie durante largos perodos. Cmo se trata? A menudo, esta  afeccin mejora sin tratamiento. Sin embargo, el tratamiento puede incluir: Quarry manager de [place] fsica o reducirla cuando siente dolor. Hacer ejercicio, incluido el fortalecimiento y Architect. Aplicar hielo o calor sobre la zona afectada. Administrar inyecciones de medicamentos para reducir Conservation officer, historic buildings y la hinchazn, o para relajar los msculos. Ciruga. Siga estas instrucciones en su casa: Medicamentos Use los medicamentos de venta libre y los recetados solamente como se lo haya indicado el mdico. Pregunte al mdico si debe evitar conducir o Risk manager mquinas mientras toma los medicamentos. Control del dolor     Si se lo indican, aplique hielo en la zona afectada. Para hacer esto: Ponga el hielo en una bolsa plstica. Coloque una toalla entre la piel y Therapist, nutritional. Aplique el hielo durante 20 minutos, 2 a 3 veces por da. Si la piel se le pone de color rojo brillante, quite el hielo de inmediato para evitar daos en la piel. El riesgo de dao en la piel es mayor si no puede sentir dolor, calor o fro. Si se lo indican, aplique calor en la zona afectada. Hgalo con la frecuencia que le haya indicado el mdico. Use la fuente de calor que el mdico le indique, por ejemplo, una compresa de calor hmedo o una almohadilla trmica. Coloque una toalla entre la piel y la fuente de Freight forwarder. Aplique calor durante 20 a 30 minutos. Si la piel se le pone de color rojo brillante, retire Nurse, children's de inmediato para evitar quemaduras. El riesgo de [place] es mayor  si no puede sentir el dolor, el calor o el fro. Actividad  Retome sus actividades normales cuando el mdico le diga que es seguro. Evite las [employer] sntomas. Descanse por breves perodos Agricultural consultant. Cuando descanse durante perodos ms largos, haga alguna actividad fsica o un estiramiento entre los perodos de descanso. Evite estar sentado durante largos perodos sin moverse. Levntese y [place] al menos una vez  cada hora. Haga suavemente los ejercicios que le indique el mdico. No levante ningn objeto que pese ms de 10 libras (4.5 kg). Aunque no tenga sntomas, evite levantar objetos pesados. Evite levantar objetos pesados de forma repetida. Al levantar objetos, hgalo siempre de una forma que sea segura para su cuerpo. Para esto, debe hacer lo siguiente: [place]. Mantenga el objeto cerca del cuerpo. No gire el cuerpo. Instrucciones generales Mantenga un peso saludable. Use calzado cmodo, que le d soporte al pie. Evite usar tacones. Evite dormir sobre un colchn que sea demasiado blando o demasiado duro. Es posible que sienta menos dolor si duerme en un colchn con apoyo suficientemente firme para la espalda. Comunquese con un mdico si: El dolor no se alivia con los [employer]. El dolor no mejora. El dolor [place]. Los sntomas duran ms de 4 semanas. Pierde peso sin proponrselo. Solicite ayuda de inmediato si: No puede controlar el pis (miccin) ni la evacuacin de la materia fecal (deposiciones). Tiene debilidad en alguna de estas zonas, y la debilidad empeora: La parte inferior de la espalda. La zona que se encuentra entre los [employer] caderas. Las nalgas. Las piernas. Siente irritacin o inflamacin en la espalda. Tiene sensacin de ardor al [employer]. Resumen La citica es el dolor, debilidad, hormigueo o prdida de la sensibilidad (adormecimiento) a lo largo del nervio citico. Esto puede incluir la parte inferior de la espala, las piernas, las caderas y los glteos. Esta afeccin se produce cuando el nervio citico se comprime o se ejerce presin sobre l. El tratamiento a menudo incluye reposo, ejercicio, medicamentos y Midwife hielo o calor en la zona afectada. Esta informacin no tiene Marine scientist el consejo del mdico. Asegrese de hacerle al mdico cualquier pregunta que tenga. Document Revised: 07/15/2021 Document Reviewed: 07/15/2021 Elsevier  Patient Education  [place].

## 2021-12-05 LAB — CBC WITH DIFFERENTIAL/PLATELET
Basophils Absolute: 0 10*3/uL (ref 0.0–0.2)
Basos: 0 %
EOS (ABSOLUTE): 0.1 10*3/uL (ref 0.0–0.4)
Eos: 2 %
Hematocrit: 36.9 % (ref 34.0–46.6)
Hemoglobin: 12.4 g/dL (ref 11.1–15.9)
Immature Grans (Abs): 0 10*3/uL (ref 0.0–0.1)
Immature Granulocytes: 0 %
Lymphocytes Absolute: 2.4 10*3/uL (ref 0.7–3.1)
Lymphs: 30 %
MCH: 27.3 pg (ref 26.6–33.0)
MCHC: 33.6 g/dL (ref 31.5–35.7)
MCV: 81 fL (ref 79–97)
Monocytes Absolute: 0.4 10*3/uL (ref 0.1–0.9)
Monocytes: 5 %
Neutrophils Absolute: 5.1 10*3/uL (ref 1.4–7.0)
Neutrophils: 63 %
Platelets: 289 10*3/uL (ref 150–450)
RBC: 4.55 x10E6/uL (ref 3.77–5.28)
RDW: 14.3 % (ref 11.7–15.4)
WBC: 8.1 10*3/uL (ref 3.4–10.8)

## 2021-12-08 ENCOUNTER — Ambulatory Visit: Payer: No Typology Code available for payment source | Admitting: Registered"

## 2021-12-10 ENCOUNTER — Encounter: Payer: Self-pay | Attending: Internal Medicine | Admitting: Dietician

## 2021-12-10 ENCOUNTER — Encounter: Payer: Self-pay | Admitting: Dietician

## 2021-12-10 DIAGNOSIS — R7303 Prediabetes: Secondary | ICD-10-CM | POA: Insufficient documentation

## 2021-12-10 NOTE — Patient Instructions (Addendum)
Aim for 150 minutes of physical activity weekly! Continue your walking.  Aim to make 1/2 of your plate fruit and/or vegetables at least 2 times per day.  Have breakfast within 1-2 hours of waking up. Eat every 3-5 hours after this.   When having a snack, make sure to choose a carbohydrate and a protein.   Avoid skipping meals.

## 2021-12-10 NOTE — Progress Notes (Signed)
Medical Nutrition Therapy  Appointment Start time:  8768  Appointment End time:  1157  Pt is here today with a [facility] interpretor named Minerva Fester.   Primary concerns today: Pt is concerned about everything she reads on the Internet about prediabetes and what she should be eating. She wants to learn about prediabetes and diabetes.   Referral diagnosis: prediabetes Preferred learning style: no preference indicated Learning readiness: ready   NUTRITION ASSESSMENT   Anthropometrics  Ht: 63in Wt: 178.1lbs  Clinical Medical Hx: anemia, HTN, prediabetes Medications: metformin Labs: A1C 6.1% 05/23/21 Notable Signs/Symptoms: bloating  Lifestyle & Dietary Hx Pt states she only has one main meal during the day which falls around 1-3pm. She states otherwise she just makes a juice in the morning and a smoothie in the evening.   Pt reports that she has family history of diabetes.   Pt states she has been doing a lot of Internet searching and finding conflicting information about carbohydrates, nutrition, and sweeteners and wants clarity on this.   Estimated daily fluid intake: 48-64 oz water Supplements: none Sleep: 9 hours Stress / self-care: moderate stress Current average weekly physical activity: walks 1 hour every day  24-Hr Dietary Recall First Meal: juice with celery, beetroot, carrots, green beans Snack: apple or banana Second Meal: 1-3pm: chicken soup with veggies OR eggs and sausage Snack: none Third Meal: none Snack: 7pm smoothie with oatmeal with banana and milk Beverages: tea, water    NUTRITION DIAGNOSIS  NB-1.1 Food and nutrition-related knowledge deficit As related to prediabetes.  As evidenced by pt report and diet history.   NUTRITION INTERVENTION  Nutrition education (E-1) on the following topics:  Importance of eating consistently Prediabetes A1C and goals Balance of carbohydrate, protein, non-starchy vegetables  Risks and benefits of artificial  sweeteners Building balanced snacks Functions of fiber  Handouts Provided Include  MyPlate Snack Sheet  Learning Style & Readiness for Change Teaching method utilized: Visual & Auditory  Demonstrated degree of understanding via: Teach Back  Barriers to learning/adherence to lifestyle change: none  Goals Established by Pt Aim for 150 minutes of physical activity weekly! Continue your walking. Aim to make 1/2 of your plate fruit and/or vegetables at least 2 times per day. Have breakfast within 1-2 hours of waking up. Eat every 3-5 hours after this.  When having a snack, make sure to choose a carbohydrate and a protein.  Avoid skipping meals.    MONITORING & EVALUATION Dietary intake, weekly physical activity, and follow up in 3 months.  Next Steps  Patient is to call for questions.

## 2021-12-22 ENCOUNTER — Ambulatory Visit (INDEPENDENT_AMBULATORY_CARE_PROVIDER_SITE_OTHER): Payer: Self-pay | Admitting: Plastic Surgery

## 2021-12-22 ENCOUNTER — Ambulatory Visit: Payer: Self-pay | Attending: Cardiology | Admitting: Cardiology

## 2021-12-22 ENCOUNTER — Encounter: Payer: Self-pay | Admitting: Cardiology

## 2021-12-22 VITALS — BP 110/80 | HR 62 | Ht 63.0 in | Wt 180.8 lb

## 2021-12-22 DIAGNOSIS — M793 Panniculitis, unspecified: Secondary | ICD-10-CM

## 2021-12-22 DIAGNOSIS — R7303 Prediabetes: Secondary | ICD-10-CM

## 2021-12-22 DIAGNOSIS — E669 Obesity, unspecified: Secondary | ICD-10-CM

## 2021-12-22 DIAGNOSIS — E782 Mixed hyperlipidemia: Secondary | ICD-10-CM

## 2021-12-22 NOTE — Progress Notes (Signed)
a Cardiology Office Note:    Date:  12/22/2021   ID:  Brooke Lam, Brooke Lam [birth year], MRN [id number]  PCP:  Ladell Pier, MD  Cardiologist:  Berniece Salines, DO  Electrophysiologist:  None   Referring MD: Ladell Pier, MD   " I am ok"   History of Present Illness:    Brooke Lam is a 41 y.o. female with a hx of prediabetes, hyperlipidemia and hypertension here today for follow-up visit.  I first saw the patient on July 14, 2021 at that time she was post ED visit where she presented for palpitations.  She was short of breath.  At the end of her visit as the patient did wear a monitor for 2 days, she also did get an echocardiogram.  She still is experiencing symptoms.  Past Medical History:  Diagnosis Date   Anemia    Blood transfusion without reported diagnosis    Ectopic pregnancy    Hypertension    Preterm labor     Past Surgical History:  Procedure Laterality Date   ABDOMINAL HYSTERECTOMY     BREAST BIOPSY Right 2013   FA   CESAREAN SECTION     C/S x 4   UPPER GASTROINTESTINAL ENDOSCOPY  04/23/2020    Current Medications: Current Meds  Medication Sig   metFORMIN (GLUCOPHAGE) 500 MG tablet Take 1 tablet (500 mg total) by mouth 2 (two) times daily with a meal.   rosuvastatin (CRESTOR) 10 MG tablet Take 1 tablet (10 mg total) by mouth daily.   topiramate (TOPAMAX) 50 MG tablet Take 1/2 tablet at bedtime for one week, then increase to 1 tablet at bedtime     Allergies:   Patient has no known allergies.   Social History   Socioeconomic History   Marital status: Single    Spouse name: Not on file   Number of children: 4   Years of education: Not on file   Highest education level: High school graduate  Occupational History   Occupation: bartender  Tobacco Use   Smoking status: Former    Types: Cigarettes    Quit date: 2005    Years since quitting: 18.7   Smokeless tobacco: Never  Vaping Use   Vaping Use: Never used   Substance and Sexual Activity   Alcohol use: Yes    Alcohol/week: 0.0 standard drinks of alcohol    Comment: every 8 days 5-8 drinks of wine and whiskey   Drug use: No   Sexual activity: Yes    Birth control/protection: None, Surgical  Other Topics Concern   Not on file  Social History Narrative   Not on file   Social Determinants of Health   Financial Resource Strain: Not on file  Food Insecurity: No Food Insecurity (06/19/2021)   Hunger Vital Sign    Worried About Running Out of Food in the Last Year: Never true    Ran Out of Food in the Last Year: Never true  Transportation Needs: No Transportation Needs (06/19/2021)   PRAPARE - Hydrologist (Medical): No    Lack of Transportation (Non-Medical): No  Physical Activity: Not on file  Stress: Not on file  Social Connections: Not on file     Family History: The patient's family history includes Diabetes in her father, mother, paternal aunt, and paternal grandmother; Hypertension in her father and mother. There is no history of Anesthesia problems, Hearing loss, Other, or Breast cancer.  ROS:   Review  of Systems  Constitution: Negative for decreased appetite, fever and weight gain.  HENT: Negative for congestion, ear discharge, hoarse voice and sore throat.   Eyes: Negative for discharge, redness, vision loss in right eye and visual halos.  Cardiovascular: Negative for chest pain, dyspnea on exertion, leg swelling, orthopnea and palpitations.  Respiratory: Negative for cough, hemoptysis, shortness of breath and snoring.   Endocrine: Negative for heat intolerance and polyphagia.  Hematologic/Lymphatic: Negative for bleeding problem. Does not bruise/bleed easily.  Skin: Negative for flushing, nail changes, rash and suspicious lesions.  Musculoskeletal: Negative for arthritis, joint pain, muscle cramps, myalgias, neck pain and stiffness.  Gastrointestinal: Negative for abdominal pain, bowel  incontinence, diarrhea and excessive appetite.  Genitourinary: Negative for decreased libido, genital sores and incomplete emptying.  Neurological: Negative for brief paralysis, focal weakness, headaches and loss of balance.  Psychiatric/Behavioral: Negative for altered mental status, depression and suicidal ideas.  Allergic/Immunologic: Negative for HIV exposure and persistent infections.    EKGs/Labs/Other Studies Reviewed:    The following studies were reviewed today:   EKG:  The ekg ordered today demonstrates   Recent Labs: 05/23/2021: ALT 13 07/28/2021: BUN 10; Creatinine, Ser 0.61; Potassium 3.5; Sodium 138 12/04/2021: Hemoglobin 12.4; Platelets 289  Recent Lipid Panel    Component Value Date/Time   CHOL 248 (H) 06/24/2021 1419   TRIG 218 (H) 06/24/2021 1419   HDL 42 06/24/2021 1419   CHOLHDL 5.9 (H) 06/24/2021 1419   LDLCALC 165 (H) 06/24/2021 1419    Physical Exam:    VS:  BP 110/80   Pulse 62   Ht '5\' 3"'$  (1.6 m)   Wt 180 lb 12.8 oz (82 kg)   LMP 10/08/2011   SpO2 99%   BMI 32.03 kg/m     Wt Readings from Last 3 Encounters:  12/22/21 180 lb 12.8 oz (82 kg)  12/10/21 178 lb 1.6 oz (80.8 kg)  12/04/21 181 lb 4.8 oz (82.2 kg)     GEN: Well nourished, well developed in no acute distress HEENT: Normal NECK: No JVD; No carotid bruits LYMPHATICS: No lymphadenopathy CARDIAC: S1S2 noted,RRR, no murmurs, rubs, gallops RESPIRATORY:  Clear to auscultation without rales, wheezing or rhonchi  ABDOMEN: Soft, non-tender, non-distended, +bowel sounds, no guarding. EXTREMITIES: No edema, No cyanosis, no clubbing MUSCULOSKELETAL:  No deformity  SKIN: Warm and dry NEUROLOGIC:  Alert and oriented x 3, non-focal PSYCHIATRIC:  Normal affect, good insight  ASSESSMENT:    1. Obesity (BMI 30-39.9)   2. Mixed hyperlipidemia   3. Prediabetes    PLAN:     1.  We discussed both her echo and her monitor results.  Everything within normal limits.  She still is experiencing  intermittent palpitations and she feels anxious.  I recommended trial of propanolol 10 mg at nighttime.  But at this time she is declined.  She may ultimately need to have her anxiety treated.  2.  Prediabetes-continue her metformin.  3.  Hyperlipidemia - continue with current statin medication.  4.  The patient understands the need to lose weight with diet and exercise. We have discussed specific strategies for this.  The patient is in agreement with the above plan. The patient left the office in stable condition.  The patient will follow up in   Medication Adjustments/Labs and Tests Ordered: Current medicines are reviewed at length with the patient today.  Concerns regarding medicines are outlined above.  No orders of the defined types were placed in this encounter.  No orders of the  defined types were placed in this encounter.   Patient Instructions  Medication Instructions:  Your physician recommends that you continue on your current medications as directed. Please refer to the Current Medication list given to you today.  *If you need a refill on your cardiac medications before your next appointment, please call your pharmacy*   Lab Work: None    Testing/Procedures: None   Follow-Up: At [facility], you and your health needs are our priority.  As part of our continuing mission to provide you with exceptional heart care, we have created designated Provider Care Teams.  These Care Teams include your primary Cardiologist (physician) and Advanced Practice Providers (APPs -  Physician Assistants and Nurse Practitioners) who all work together to provide you with the care you need, when you need it.  We recommend signing up for the patient portal called "MyChart".  Sign up information is provided on this After Visit Summary.  MyChart is used to connect with patients for Virtual Visits (Telemedicine).  Patients are able to view lab/test results, encounter notes, upcoming  appointments, etc.  Non-urgent messages can be sent to your provider as well.   To learn more about what you can do with MyChart, go to [email or web address].    Your next appointment:   1 year(s)  The format for your next appointment:   In Person  Provider:   Berniece Salines, DO     Other Instructions   Important Information About Sugar         Adopting a Healthy Lifestyle.  Know what a healthy weight is for you (roughly BMI <25) and aim to maintain this   Aim for 7+ servings of fruits and vegetables daily   65-80+ fluid ounces of water or unsweet tea for healthy kidneys   Limit to max 1 drink of alcohol per day; avoid smoking/tobacco   Limit animal fats in diet for cholesterol and heart health - choose grass fed whenever available   Avoid highly processed foods, and foods high in saturated/trans fats   Aim for low stress - take time to unwind and care for your mental health   Aim for 150 min of moderate intensity exercise weekly for heart health, and weights twice weekly for bone health   Aim for 7-9 hours of sleep daily   When it comes to diets, agreement about the perfect plan isnt easy to find, even among the experts. Experts at the [place] developed an idea known as the Healthy Eating Plate. Just imagine a plate divided into logical, healthy portions.   The emphasis is on diet quality:   Load up on vegetables and fruits - one-half of your plate: Aim for color and variety, and remember that potatoes dont count.   Go for whole grains - one-quarter of your plate: Whole wheat, barley, wheat berries, quinoa, oats, brown rice, and foods made with them. If you want pasta, go with whole wheat pasta.   Protein power - one-quarter of your plate: Fish, chicken, beans, and nuts are all healthy, versatile protein sources. Limit red meat.   The diet, however, does go beyond the plate, offering a few other suggestions.   Use healthy plant oils,  such as olive, canola, soy, corn, sunflower and peanut. Check the labels, and avoid partially hydrogenated oil, which have unhealthy trans fats.   If youre thirsty, drink water. Coffee and tea are good in moderation, but skip sugary drinks and limit milk and dairy products to  one or two daily servings.   The type of carbohydrate in the diet is more important than the amount. Some sources of carbohydrates, such as vegetables, fruits, whole grains, and beans-are healthier than others.   Finally, stay active  Signed, Berniece Salines, DO  12/22/2021 8:34 AM    [facility]

## 2021-12-22 NOTE — Patient Instructions (Signed)
Medication Instructions:  Your physician recommends that you continue on your current medications as directed. Please refer to the Current Medication list given to you today.  *If you need a refill on your cardiac medications before your next appointment, please call your pharmacy*   Lab Work: None    Testing/Procedures: None   Follow-Up: At [facility], you and your health needs are our priority.  As part of our continuing mission to provide you with exceptional heart care, we have created designated Provider Care Teams.  These Care Teams include your primary Cardiologist (physician) and Advanced Practice Providers (APPs -  Physician Assistants and Nurse Practitioners) who all work together to provide you with the care you need, when you need it.  We recommend signing up for the patient portal called "MyChart".  Sign up information is provided on this After Visit Summary.  MyChart is used to connect with patients for Virtual Visits (Telemedicine).  Patients are able to view lab/test results, encounter notes, upcoming appointments, etc.  Non-urgent messages can be sent to your provider as well.   To learn more about what you can do with MyChart, go to [email or web address].    Your next appointment:   1 year(s)  The format for your next appointment:   In Person  Provider:   Berniece Salines, DO     Other Instructions   Important Information About Sugar

## 2021-12-23 ENCOUNTER — Telehealth: Payer: Self-pay | Admitting: Licensed Clinical Social Worker

## 2021-12-23 NOTE — Telephone Encounter (Signed)
H&V Care Navigation CSW Progress Note  Clinical Social Worker contacted patient by phone to f/u on expired CAFA. Attempted pt w/ assistance of [place], #[id number]. No answer at [phone], did not leave a voicemail at this time, will re-attempt again as able.   Patient is participating in a Managed Medicaid Plan:  No, self pay only.   SDOH Screenings   Food Insecurity: No Food Insecurity (06/19/2021)  Transportation Needs: No Transportation Needs (06/19/2021)  Depression (PHQ2-9): Low Risk  (12/10/2021)  Tobacco Use: Medium Risk (12/22/2021)    Westley Hummer, MSW, [place]  [phone]- work cell phone (preferred) [phone]- desk phone

## 2021-12-24 ENCOUNTER — Telehealth: Payer: Self-pay | Admitting: Licensed Clinical Social Worker

## 2021-12-24 NOTE — Telephone Encounter (Signed)
H&V Care Navigation CSW Progress Note  Clinical Social Worker contacted patient by phone to f/u on expired CAFA and complete SDOH screening. Again no answer at [phone] with assistance of [facility] [phone]. Will reattempt again as able.   Patient is participating in a Managed Medicaid Plan:  no, self pay only. Needs to renew CAFA/OC  SDOH Screenings   Food Insecurity: No Food Insecurity (06/19/2021)  Transportation Needs: No Transportation Needs (06/19/2021)  Depression (PHQ2-9): Low Risk  (12/10/2021)  Tobacco Use: Medium Risk (12/22/2021)   Westley Hummer, MSW, [place]  [phone]- work cell phone (preferred) [phone]- desk phone

## 2021-12-31 ENCOUNTER — Telehealth: Payer: Self-pay | Admitting: Licensed Clinical Social Worker

## 2022-01-01 NOTE — Telephone Encounter (Signed)
H&V Care Navigation CSW Progress Note  Clinical Social Worker contacted patient by phone to f/u on assistance applications with assistance of [employer]. Pt shares that she has been working on applications- she has needed to access a notary then should have them completed for submission. LCSW will f/u next week to ensure pt accessed notary and assist if needed with submitting applications.   Patient is participating in a Managed Medicaid Plan:  No, self pay.   SDOH Screenings   Food Insecurity: No Food Insecurity (06/19/2021)  Transportation Needs: No Transportation Needs (06/19/2021)  Depression (PHQ2-9): Low Risk  (12/10/2021)  Tobacco Use: Medium Risk (12/22/2021)   Westley Hummer, MSW, [place]  [phone]- work cell phone (preferred) [phone]- desk phone

## 2022-01-05 ENCOUNTER — Telehealth: Payer: Self-pay | Admitting: Licensed Clinical Social Worker

## 2022-01-05 NOTE — Telephone Encounter (Signed)
H&V Care Navigation CSW Progress Note  Clinical Social Worker contacted patient by phone to f/u on information received about free notary at [facility]. LCSW was able to reach pt at [phone] with the assistance of [facility] Christy Sartorius #[id number]. Re-introduced self, role, reason for call. Pt shares she was still unable to get paperwork notarized last week. I shared that [place] has hired a Interior and spatial designer and that he is a Patent examiner. I let pt know that he is taking appts and if she is interested he could also take her as a walk in. Pt will call and make an appt to complete application w/ his notarization and submit. LCSW remains available as needed.   Patient is participating in a Managed Medicaid Plan:  No, self pay, needs to renew applications  [place]: No Food Insecurity (06/19/2021)  Transportation Needs: No Transportation Needs (06/19/2021)  Depression (PHQ2-9): Low Risk  (12/10/2021)  Tobacco Use: Medium Risk (12/22/2021)    Westley Hummer, MSW, [place]  [phone]- work cell phone (preferred) [phone]- desk phone'

## 2022-01-09 ENCOUNTER — Telehealth: Payer: Self-pay | Admitting: Licensed Clinical Social Worker

## 2022-01-09 NOTE — Telephone Encounter (Signed)
H&V Care Navigation CSW Progress Note  Clinical Social Worker  was contacted by pt  to f/u on applications with the assistance of [place] [phone]. She has completed them and thinks she has all needed documents. She plans to bring them by financial counseling office. I confirmed that this was completed by Jake Seats, Development worker, community. Pt advised to get letter of non-filing and then will be able to complete application. I remain available.   Patient is participating in a Managed Medicaid Plan:  No self pay only.  SDOH Screenings   Food Insecurity: No Food Insecurity (06/19/2021)  Transportation Needs: No Transportation Needs (06/19/2021)  Depression (PHQ2-9): Low Risk  (12/10/2021)  Tobacco Use: Medium Risk (12/22/2021)   Westley Hummer, MSW, [place]  [phone]- work cell phone (preferred) [phone]- desk phone

## 2022-01-12 NOTE — Progress Notes (Signed)
[facility] New Patient Evaluation and Consultation  Referring Provider: Shelly Bombard, MD PCP: Ladell Pier, MD Date of Service: 01/13/2022  SUBJECTIVE Chief Complaint: New Patient (Initial Visit) Brooke Lam is a 41 y.o. female complains of bleeding during intercourse. Pt said sex is painful and a cyst on the ovary. Pt also complains of yeast infection 3x this year.)  History of Present Illness: Brooke Lam is a 41 y.o.  hispanic  female seen in consultation at the request of Dr. Jodi Mourning for evaluation of post-coital bleeding after hysterectomy.    Review of records significant for: Had supracervical cesarean hysterectomy for placenta increta. Having post coital bleeding. Positive for bacterial vaginosis 09/2021. Pap in 2022 was negative  Urinary Symptoms: Leaks urine with with urgency- occurs rarely, not bothersome  Day time voids 6.  Nocturia: 1 times per night to void. Voiding dysfunction: she empties her bladder well.  does not use a catheter to empty bladder.  When urinating, she feels she has no difficulties   UTIs: 3 UTI's in the last year.   Denies history of blood in urine  Pelvic Organ Prolapse Symptoms:                  She Denies a feeling of a bulge the vaginal area.  Bowel Symptom: Bowel movements: every 1-3 days Stool consistency: soft  Straining: no.  Splinting: no.  Incomplete evacuation: no.  She Denies accidental bowel leakage / fecal incontinence Bowel regimen: none  Sexual Function Sexually active: yes.  Pain with sex: Yes, deep in the pelvis, has discomfort due to dryness  Pelvic Pain Denies pelvic pain   When she has sex, she has bleeding. When she wipes, she has blood like a period. Does not happen every month. Happens more with deeper penetration. Denies abdominal pain.    Past Medical History:  Past Medical History:  Diagnosis Date   Anemia    Blood transfusion without reported diagnosis     Ectopic pregnancy    Hypertension    Preterm labor      Past Surgical History:   Past Surgical History:  Procedure Laterality Date   ABDOMINAL HYSTERECTOMY     BREAST BIOPSY Right 2013   FA   CESAREAN SECTION     C/S x 4   UPPER GASTROINTESTINAL ENDOSCOPY  04/23/2020     Past OB/GYN History: OB History  Gravida Para Term Preterm AB Living  '7 5 3 2 2 4  '$ SAB IAB Ectopic Multiple Live Births  1 0 1 0 4    # Outcome Date GA Lbr Len/2nd Weight Sex Delivery Anes PTL Lv  7 Preterm 04/28/12 [redacted]w[redacted]d 2 lb 6 oz (1.077 kg) M CS-Unspec   LIV     Complications: Placenta Previa  6 Term 07/30/04    F CS-LTranv  N LIV  5 Term 01/26/03    F CS-LTranv  N LIV     Birth Comments: repeat  4 Term 12/01/95    F CS-LTranv  N LIV     [place] labor- didn't dilate  3 Preterm           2 SAB              Birth Comments: System Generated. Please review and update pregnancy details.  1 Ectopic              Birth Comments: MTX- 2009    S/p hysterectomy for placenta increta  [facility]    Medications: She has a current medication list which includes the following prescription(s): metformin and rosuvastatin.   Allergies: Patient has No Known Allergies.   Social History:  Social History   Tobacco Use   Smoking status: Former    Types: Cigarettes    Quit date: 2005    Years since quitting: 18.8   Smokeless tobacco: Never  Vaping Use   Vaping Use: Never used  Substance Use Topics   Alcohol use: Yes    Alcohol/week: 0.0 standard drinks of alcohol    Comment: every 8 days 5-8 drinks of wine and whiskey   Drug use: No    Relationship status: single She lives with 3 children and mother.   She is not employed. Regular exercise: No   Family History:   Family History  Problem Relation Age of Onset   Diabetes Mother    Hypertension Mother    Diabetes Father    Hypertension Father    Diabetes Paternal Aunt    Diabetes  Paternal Grandmother    Anesthesia problems Neg Hx    Hearing loss Neg Hx    Other Neg Hx    Breast cancer Neg Hx      Review of Systems: Review of Systems  Constitutional:  Negative for fever, malaise/fatigue and weight loss.  Respiratory:  Negative for cough, shortness of breath and wheezing.   Cardiovascular:  Negative for chest pain, palpitations and leg swelling.  Gastrointestinal:  Negative for abdominal pain and blood in stool.  Genitourinary:  Negative for dysuria.       Vaginal discharge  Musculoskeletal:  Negative for myalgias.  Skin:  Negative for rash.  Neurological:  Negative for dizziness and headaches.  Endo/Heme/Allergies:  Does not bruise/bleed easily.  Psychiatric/Behavioral:  Negative for depression. The patient is not nervous/anxious.      OBJECTIVE Physical Exam: Vitals:   01/13/22 1318  BP: 124/81  Pulse: 72  Weight: 179 lb (81.2 kg)  Height: '5\' 2"'$  (1.575 m)    Physical Exam Constitutional:      General: She is not in acute distress. Pulmonary:     Effort: Pulmonary effort is normal.  Abdominal:     General: There is no distension.     Palpations: Abdomen is soft.     Tenderness: There is no abdominal tenderness. There is no rebound.  Musculoskeletal:        General: No swelling. Normal range of motion.  Skin:    General: Skin is warm and dry.     Findings: No rash.  Neurological:     Mental Status: She is alert and oriented to person, place, and time.  Psychiatric:        Mood and Affect: Mood normal.        Behavior: Behavior normal.      GU / Detailed Urogynecologic Evaluation:  Pelvic Exam: Normal external female genitalia; Bartholin's and Skene's glands normal in appearance; urethral meatus normal in appearance, no urethral masses or discharge.   CST: negative  Speculum exam reveals normal vaginal mucosa without atrophy. Cervix normal appearance. Uterus absent. Adnexa  not palpable .     Pelvic floor musculature: Right levator  non-tender, Right obturator non-tender, Left levator non-tender, Left obturator non-tender  POP-Q:   POP-Q  -3  Aa   -3                                           Ba  -9                                              C   3                                            Gh  4.5                                            Pb  11                                            tvl   -3                                            Ap  -3                                            Bp  -11                                              D      Rectal Exam:  Normal external rectum  Post-Void Residual (PVR) by Bladder Scan: In order to evaluate bladder emptying, we discussed obtaining a postvoid residual and she agreed to this procedure.  Procedure: The ultrasound unit was placed on the patient's abdomen in the suprapubic region after the patient had voided. A PVR of 5 ml was obtained by bladder scan.  Laboratory Results: POC urine: trace blood, otherwise negative  ASSESSMENT AND PLAN [name] is a 41 y.o. with:  1. Bleeding of cervix   2. Urge incontinence   3. Urinary frequency    - Discussed with patient that bleeding is likely coming from retained cervix, although no active bleeding noted today. Explained bleeding can be hormonally driven and would likely continue until she is menopausal. Reviewed option of hormonal contraception to prevent bleeding vs trachelectomy.  - She is unsure she wants to pursue surgery at this time.  - We also discussed she has an ovarian cyst which has been present for several years and appears smaller on most recent ultrasound. She is not bothered by any pelvic/ abdominal pain currently.  - Incontinence is infrequent and not bothersome to her.   She will let us know if she decides to pursue trachelectomy, otherwise can follow up with Dr Jodi Mourning on  an annual basis.    Jaquita Folds, MD

## 2022-01-13 ENCOUNTER — Ambulatory Visit (INDEPENDENT_AMBULATORY_CARE_PROVIDER_SITE_OTHER): Payer: Self-pay | Admitting: Obstetrics and Gynecology

## 2022-01-13 ENCOUNTER — Encounter: Payer: Self-pay | Admitting: Obstetrics and Gynecology

## 2022-01-13 VITALS — BP 124/81 | HR 72 | Ht 62.0 in | Wt 179.0 lb

## 2022-01-13 DIAGNOSIS — N3941 Urge incontinence: Secondary | ICD-10-CM

## 2022-01-13 DIAGNOSIS — R350 Frequency of micturition: Secondary | ICD-10-CM

## 2022-01-13 DIAGNOSIS — N888 Other specified noninflammatory disorders of cervix uteri: Secondary | ICD-10-CM

## 2022-01-13 LAB — POCT URINALYSIS DIPSTICK
Bilirubin, UA: NEGATIVE
Glucose, UA: NEGATIVE
Ketones, UA: NEGATIVE
Leukocytes, UA: NEGATIVE
Nitrite, UA: NEGATIVE
Protein, UA: NEGATIVE
Spec Grav, UA: 1.015 (ref 1.010–1.025)
Urobilinogen, UA: 0.2 E.U./dL
pH, UA: 6 (ref 5.0–8.0)

## 2022-01-23 ENCOUNTER — Ambulatory Visit (INDEPENDENT_AMBULATORY_CARE_PROVIDER_SITE_OTHER): Payer: Self-pay | Admitting: Plastic Surgery

## 2022-01-23 VITALS — BP 133/86 | HR 66 | Ht 63.0 in | Wt 179.6 lb

## 2022-01-23 DIAGNOSIS — L905 Scar conditions and fibrosis of skin: Secondary | ICD-10-CM

## 2022-01-26 ENCOUNTER — Telehealth: Payer: Self-pay | Admitting: Licensed Clinical Social Worker

## 2022-01-26 NOTE — Telephone Encounter (Signed)
H&V Care Navigation CSW Progress Note  Clinical Social Worker contacted patient by phone to f/u on letter of nonfiling for assistance application. With assistance of [place] #[id number] I was able to reach [phone], a daughter, Pa, answered the phone (she states she speaks [place]). Since no DPR on file, I just requested she let pt know I was calling to see if she was able to get assistance paperwork completed. Pt daughter will pass this on to pt and have her call our office if any issues. I will re-attempt pt again as able.   Patient is participating in a Managed Medicaid Plan:  No, self pay only.   SDOH Screenings   Food Insecurity: No Food Insecurity (06/19/2021)  Transportation Needs: No Transportation Needs (06/19/2021)  Depression (PHQ2-9): Low Risk  (12/10/2021)  Tobacco Use: Medium Risk (01/13/2022)    Westley Hummer, MSW, [place]  [phone]- work cell phone (preferred) [phone]- desk phone

## 2022-01-27 ENCOUNTER — Encounter: Payer: Self-pay | Admitting: Plastic Surgery

## 2022-01-27 NOTE — Progress Notes (Signed)
Referring Provider Ladell Pier, MD [address] [place],  [place] [address]   CC:  Chief Complaint  Patient presents with   Advice Only      Brooke Lam is an 40 y.o. female.  HPI: Brooke Lam turns today for evaluation and scheduling of surgery.  Her entire exam was conducted through a hospital contracted interpreter.  Is a scar across the lower portion of her abdomen with a little bit of asymmetry of the fat at the distal portion her abdominal wall and the beginning of the scar.  She would like to have this addressed for better symmetry.  No Known Allergies  Outpatient Encounter Medications as of 01/23/2022  Medication Sig   metFORMIN (GLUCOPHAGE) 500 MG tablet Take 1 tablet (500 mg total) by mouth 2 (two) times daily with a meal.   rosuvastatin (CRESTOR) 10 MG tablet Take 1 tablet (10 mg total) by mouth daily.   No facility-administered encounter medications on file as of 01/23/2022.     Past Medical History:  Diagnosis Date   Anemia    Blood transfusion without reported diagnosis    Ectopic pregnancy    Hypertension    Preterm labor     Past Surgical History:  Procedure Laterality Date   ABDOMINAL HYSTERECTOMY     BREAST BIOPSY Right 2013   FA   CESAREAN SECTION     C/S x 4   UPPER GASTROINTESTINAL ENDOSCOPY  04/23/2020    Family History  Problem Relation Age of Onset   Diabetes Mother    Hypertension Mother    Diabetes Father    Hypertension Father    Diabetes Paternal Aunt    Diabetes Paternal Grandmother    Anesthesia problems Neg Hx    Hearing loss Neg Hx    Other Neg Hx    Breast cancer Neg Hx     Social History   Social History Narrative   Not on file     Review of Systems General: Denies fevers, chills, weight loss CV: Denies chest pain, shortness of breath, palpitations Abdomen: Well-healed low transverse scar from previous C-section.  Has a small amount of asymmetry of the skin and fat above the scar.  She  notes occasional pain in the scar.  Physical Exam    01/23/2022    3:28 PM 01/13/2022    1:18 PM 12/22/2021    8:03 AM  Vitals with BMI  Height '5\' 3"'$  '5\' 2"'$  '5\' 3"'$   Weight 179 lbs 10 oz 179 lbs 180 lbs 13 oz  BMI 31.82 97.98 92.11  Systolic 941 740 814  Diastolic 86 81 80  Pulse 66 72 62    General:  No acute distress,  Alert and oriented, Non-Toxic, Normal speech and affect Abdomen: Scars noted above.  The scar is somewhat tethered to the underlying tissue and may be causing discomfort from this tethering.  She does as noted have some asymmetry of the fat above the scar.  I cannot appreciate any pannus.  Also cannot appreciate enough laxity to do any type of abdominoplasty. Mammogram: Not applicable Assessment/Plan C-section incision scar: I discussed options for managing both the scar and the asymmetry with the patient.  These options include doing nothing which is certainly a viable option.  I could revise the scar and at the same time remove a small amount of fat from the right portion of the abdominal wall to give her better symmetry.  She has asked that I do  this.  We discussed the risks of bleeding infection seroma formation and persistent asymmetry postoperatively.  He understands that she may need a drain after this is done.  So understands that she will have a binder.  We will submit her for a scar revision.  Brooke Lam 01/27/2022, 10:07 AM

## 2022-01-28 ENCOUNTER — Encounter: Payer: Self-pay | Admitting: Gastroenterology

## 2022-01-28 ENCOUNTER — Other Ambulatory Visit: Payer: No Typology Code available for payment source

## 2022-01-28 ENCOUNTER — Ambulatory Visit (INDEPENDENT_AMBULATORY_CARE_PROVIDER_SITE_OTHER): Payer: Self-pay | Admitting: Gastroenterology

## 2022-01-28 VITALS — BP 110/80 | HR 78 | Ht 63.0 in | Wt 178.2 lb

## 2022-01-28 DIAGNOSIS — R1012 Left upper quadrant pain: Secondary | ICD-10-CM

## 2022-01-28 DIAGNOSIS — R140 Abdominal distension (gaseous): Secondary | ICD-10-CM

## 2022-01-28 DIAGNOSIS — R141 Gas pain: Secondary | ICD-10-CM

## 2022-01-28 DIAGNOSIS — D72820 Lymphocytosis (symptomatic): Secondary | ICD-10-CM

## 2022-01-28 MED ORDER — PANTOPRAZOLE SODIUM 40 MG PO TBEC: 40.0000 mg | DELAYED_RELEASE_TABLET | Freq: Every day | ORAL | 3 refills | Status: DC

## 2022-01-28 NOTE — Progress Notes (Signed)
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 Ernst Bowler with reassurance provided   LUQ abdominal pain may be due to gastritis/duodenitis. Will resume pantoprazole QAM. Avoid NSAIDs. Considering IBS given the chronicity and the concurrent constipation.   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   PLAN: - HLA-DQ2/DQ8 genotyping  - Avoid NSAIDs - Resume pantoprazole 40 mg QAM x at least a 3 month treatment trial  - Discussed trial off gluten for symptom control - Consider trial of dicyclomine 20 mg QID if pantoprazole is not helping - Consider cross-sectional abdominal imaging +/- GYN if no response to pantoprazole - Office follow-up 6-8 weeks, earlier if needed  Please see the "Patient Instructions" section for addition details about the plan.  HPI: Brooke Lam is a 41 y.o. female who returns in follow-up. She was originally seen 04/22/20 for evaluation of abdominal pain. She had an EGD performed 04/23/20. She returns in scheduled follow-up. The interval history is obtained through the patient with the assistance of a spanish interpreter. 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  At time of initial consultation she was reporting a one year history of intermittent 5/10 LUQ pain with associated gas, nausea and bloating. Triggered by fatty and greasy foods. No change with position or defecation. No other identified exacerbating or relieving features. No alarm features.  Some constipation that is diet related. She thinks that this is a results of her disrupted eating habits from working the 3rd shift as a Chief Operating Officer.  Drinks whiskey on  the weekend, but notes no change in symptoms or frequency of symptoms on the weekends.   Labs 06/06/19: normal liver enzymes Labs 09/20/19: WBC 7.5, hgb 11.9, MCV 86.2, platelets 252  Evaluation of symptoms includes: - EGD 04/23/2020: chronic duodenitis, intraepithelial lymphocytosis, gastropathy - Normal liver enzymes and lipase - TTGA <1.0 - IgA low at 70; IgG, IgM, and IgE are normal - Abdominal ultrasound 04/29/20: echogenic liver, no gallstones, normal exam - HIDA 05/14/20: Negative exam, GBEF 45% - Food allergy evaluation with Dr. Ernst Bowler was negative  Symptoms were controlled on pantoprazole. When she stopped the medication the symptoms returned. She feels like her stomach is inflamed. Symptoms improved with mint tea. Stable bowel habits with one stool every 2-3 days.   No NSAIDs.     Past Medical History:  Diagnosis Date   Anemia    Blood transfusion without reported diagnosis    Ectopic pregnancy    Hypertension    Preterm labor     Past Surgical History:  Procedure Laterality Date   ABDOMINAL HYSTERECTOMY     BREAST BIOPSY Right 2013   FA   CESAREAN SECTION     C/S x 4   UPPER GASTROINTESTINAL ENDOSCOPY  04/23/2020    Current Outpatient Medications  Medication Sig Dispense Refill   metFORMIN (GLUCOPHAGE) 500 MG tablet Take 1 tablet (500 mg total) by mouth 2 (two) times daily with a meal. 180 tablet 3   rosuvastatin (CRESTOR) 10 MG tablet Take 1 tablet (10 mg total) by mouth daily. 90 tablet 3   No current facility-administered medications for this visit.    Allergies as of 01/28/2022   (No Known Allergies)  Family History  Problem Relation Age of Onset   Diabetes Mother    Hypertension Mother    Diabetes Father    Hypertension Father    Diabetes Paternal Aunt    Diabetes Paternal Grandmother    Anesthesia problems Neg Hx    Hearing loss Neg Hx    Other Neg Hx    Breast cancer Neg Hx      Physical Exam: General:   Alert,  well-nourished,  pleasant and cooperative in NAD Head:  Normocephalic and atraumatic. Eyes:  Sclera clear, no icterus.   Conjunctiva pink. Abdomen:  Soft ,nontender, nondistended, normal bowel sounds, no rebound or guarding. No hepatosplenomegaly.  I am unable to reproduce her abdominal pain on exam.  Neurologic:  Alert and  oriented x4;  grossly nonfocal Skin:  Intact without significant lesions or rashes. Psych:  Alert and cooperative. Normal mood and affect.   Arleta Ostrum L. Tarri Glenn, MD, MPH 01/28/2022, 9:38 AM

## 2022-01-28 NOTE — Patient Instructions (Signed)
We will resume pantoprazole 40 mg every morning to try to get you feeling better.  Please avoid all NSAIDs.  I recommended one test to looks for gluten allergy. Please stop in the lab today.   Trial of gluten free diet recommended. (Brochure provided today).  I would like to see you back in the office in 6-8 weeks. We will consider a CT scan if you are not getting better.

## 2022-02-02 ENCOUNTER — Telehealth: Payer: Self-pay | Admitting: Licensed Clinical Social Worker

## 2022-02-02 NOTE — Telephone Encounter (Signed)
H&V Care Navigation CSW Progress Note   Clinical Social Worker contacted patient by phone to f/u on letter of nonfiling for assistance application again. With assistance of [employer] [phone], I was able to contact [phone], a female answered the phone and hung up- then upon reattempting we were sent to voicemail. I have attempted to f/u with pt multiple times without having contact w/ pt. I remain available if needed.    Patient is participating in a Managed Medicaid Plan:  No, self pay only.     SDOH Screenings   Food Insecurity: No Food Insecurity (06/19/2021)  Transportation Needs: No Transportation Needs (06/19/2021)  Depression (PHQ2-9): Low Risk  (12/10/2021)  Tobacco Use: Medium Risk (01/28/2022)   Westley Hummer, MSW, [place]  [phone]- work cell phone (preferred) [phone]- desk phone

## 2022-02-04 LAB — CELIAC HLA RFLX TO ABS
DQ2 (DQA1 0501/0505,DQB1 02XX): NEGATIVE
DQ8 (DQA1 03XX, DQB1 0302): NEGATIVE

## 2022-02-25 ENCOUNTER — Encounter: Payer: No Typology Code available for payment source | Admitting: Dietician

## 2022-03-05 ENCOUNTER — Encounter: Payer: Self-pay | Admitting: Dietician

## 2022-03-05 ENCOUNTER — Encounter: Payer: Self-pay | Attending: Internal Medicine | Admitting: Dietician

## 2022-03-05 VITALS — Ht 63.0 in | Wt 180.9 lb

## 2022-03-05 DIAGNOSIS — R7303 Prediabetes: Secondary | ICD-10-CM | POA: Insufficient documentation

## 2022-03-05 NOTE — Progress Notes (Signed)
Medical Nutrition Therapy  Appointment Start time:  [phone]  Appointment End time:  0940   Primary concerns today: Pt is concerned about everything she reads on the Internet about prediabetes and what she should be eating. She wants to learn about prediabetes and diabetes.    Referral diagnosis: prediabetes Preferred learning style: no preference indicated Learning readiness: ready     NUTRITION ASSESSMENT    Anthropometrics  Ht: 63in Wt 12/10/21: 178.1 lbs Wt 03/05/22: 180.9 lbs   Clinical Medical Hx: anemia, HTN, prediabetes Medications: metformin Labs: A1C 6.1% 05/23/21 Notable Signs/Symptoms: bloating   Lifestyle & Dietary Hx  Pt is present today with a [facility] interpretor named Myra.   Pt states she has been worried recently because she's been busy with work and stressed and thinks that she hasn't been eating what she is suppose to.   Pt reports she has been getting bloated and feels like sometimes she feels like she looks pregnant and feels uncomfortable. She notices that this has been happening more often since starting back at work over the last month and eating out more.   Since pt started back at work she has been skipping breakfast. Pt continues to walk daily after work.   Pt states sometimes when she craves sweets she will get a small candy bar or a soda and only drink part of it and then throws the rest away.    Estimated daily fluid intake: 48-64 oz water Supplements: none Sleep: 9 hours Stress / self-care: moderate stress Current average weekly physical activity: walks 1 hour every day   24-Hr Dietary Recall First Meal: none Snack: none Second Meal: 11am: rice and meat and vegetables or beans Snack: none Third Meal: 6pm: meat with tomatoes and peppers in oil Snack: banana and milk and oatmeal shake Beverages: coffee or tea, water      NUTRITION DIAGNOSIS  NB-1.1 Food and nutrition-related knowledge deficit As related to prediabetes.  As evidenced by pt report  and diet history.     NUTRITION INTERVENTION  Nutrition education (E-1) on the following topics:  Importance of eating consistently Balance of carbohydrate, protein, non-starchy vegetables  Importance of eating breakfast Causes of bloating Strategies for eating out Functions of fiber   Handouts Provided Include  No handouts provided at this follow-up   Learning Style & Readiness for Change Teaching method utilized: Visual & Auditory  Demonstrated degree of understanding via: Teach Back  Barriers to learning/adherence to lifestyle change: none   Goals Established by Pt Aim for 150 minutes of physical activity weekly! Continue your walking. Aim to make 1/2 of your plate fruit and/or vegetables at least 2 times per day. Have breakfast within 1-2 hours of waking up. Eat every 3-5 hours after this.  When having a snack, make sure to choose a carbohydrate and a protein.  Avoid skipping meals.   New goal: Before work make your smoothie with milk, banana, oatmeal and peanut butter (or other fruit).   MONITORING & EVALUATION Dietary intake, weekly physical activity, and follow up in 3 months.   Next Steps  Patient is to call for questions.

## 2022-03-10 ENCOUNTER — Telehealth: Payer: Self-pay | Admitting: *Deleted

## 2022-03-10 ENCOUNTER — Encounter: Payer: Self-pay | Admitting: *Deleted

## 2022-03-10 NOTE — Telephone Encounter (Signed)
11/17 - sent mychart message to patient to let her know [place] card does not cover elective cosmetic procedures.  Formal quote being sent today.

## 2022-03-11 ENCOUNTER — Telehealth: Payer: Self-pay | Admitting: *Deleted

## 2022-03-11 NOTE — Telephone Encounter (Signed)
Message sent to patient to call me when she is ready to schedule now that she has her quote. In that message also notified patient that revision of a c-section scar is cosmetic and thus not covered by [employer] .

## 2022-04-01 ENCOUNTER — Ambulatory Visit (INDEPENDENT_AMBULATORY_CARE_PROVIDER_SITE_OTHER): Payer: Self-pay | Admitting: Gastroenterology

## 2022-04-01 ENCOUNTER — Encounter: Payer: Self-pay | Admitting: Gastroenterology

## 2022-04-01 VITALS — BP 116/68 | HR 75 | Ht 63.0 in | Wt 180.0 lb

## 2022-04-01 DIAGNOSIS — D72820 Lymphocytosis (symptomatic): Secondary | ICD-10-CM

## 2022-04-01 DIAGNOSIS — R1012 Left upper quadrant pain: Secondary | ICD-10-CM

## 2022-04-01 MED ORDER — PANTOPRAZOLE SODIUM 40 MG PO TBEC: 40.0000 mg | DELAYED_RELEASE_TABLET | Freq: Two times a day (BID) | ORAL | 3 refills | Status: DC

## 2022-04-01 MED ORDER — DICYCLOMINE HCL 20 MG PO TABS: 20.0000 mg | ORAL_TABLET | Freq: Four times a day (QID) | ORAL | 3 refills | Status: DC | PRN

## 2022-04-01 NOTE — Progress Notes (Signed)
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 Ernst Bowler with reassurance provided    - DQ2/DQ8 negative   LUQ abdominal pain may be due to gastritis/duodenitis but it has not resolved despite PPI BID. Metformin may be contributing but I don't think it explains the pain. Considering IBS given the chronicity and the concurrent constipation. Must consider other etiologies so I am recommending cross-sectional imaging.  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 Avoid all NSAIDs. Consider repeat EGD with biopsies if symptoms persist.   PLAN: - Continue pantoprazole 40 mg BID - Trial of dicyclomine 20 mg QID - Consider addition of famotidine if the strategy above is not helping - CT scan abd/pelvis with contrast - Office follow-up 6-8 weeks, earlier if needed  Please see the "Patient Instructions" section for addition details about the plan.  HPI: Brooke Lam is a 42 y.o. female who returns in follow-up for the evaluation of abdominal pain. She returns in scheduled follow-up. The interval history is obtained through the patient with the assistance of a spanish interpreter. 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  At time of initial consultation she was reporting a one year history of intermittent 5/10 LUQ pain with associated gas, nausea and bloating. Triggered by fatty and greasy foods. No change with position or defecation. No other identified exacerbating or relieving features. No alarm features.  Some constipation that is diet related. She thinks that this is a results of her disrupted eating habits from working the 3rd shift as a Chief Operating Officer.  Drinks whiskey on the  weekend, but notes no change in symptoms or frequency of symptoms on the weekends.   Labs 06/06/19: normal liver enzymes Labs 09/20/19: WBC 7.5, hgb 11.9, MCV 86.2, platelets 252  Evaluation of symptoms includes: - EGD 04/23/2020: chronic duodenitis, intraepithelial lymphocytosis, gastropathy - Normal liver enzymes and lipase - TTGA <1.0 - IgA low at 70; IgG, IgM, and IgE are normal - Abdominal ultrasound 04/29/20: echogenic liver, no gallstones, normal exam - HIDA 05/14/20: Negative exam, GBEF 45% - Food allergy evaluation with Dr. Ernst Bowler was negative - Celiac HLA DQ2 and DQ8 testing negative 01/28/22  On office visit 01/28/22 her symptoms were well controlled on pantoprazole. When she stopped the medication the symptoms returned. She feels like her stomach is inflamed. Symptoms improved with mint tea. Stable bowel habits with one stool every 2-3 days. No NSAIDs.    Returns today in follow-up. She continues to have some pain that comes and goes despite taking pantoprazole 40 mg BID. She has intermittent nausea, LUQ pain, and bloating. No diarrhea or constipation. No blood in the stool. Weight is stable. Appetite is good.   She has a bowel movement every 3 days since she was a child.  Symptoms were evaluated by hr GYN 3/23. He identified a hemorrhagic cyst versus endometrioma. She was provided reassurance and told to follow-up PRN. Notes from that visit were reviewed.  Past Medical History:  Diagnosis Date   Anemia    Blood transfusion without reported diagnosis    Ectopic pregnancy    Hypertension    Preterm labor     Past Surgical History:  Procedure Laterality Date   ABDOMINAL HYSTERECTOMY  BREAST BIOPSY Right 2013   FA   CESAREAN SECTION     C/S x 4   UPPER GASTROINTESTINAL ENDOSCOPY  04/23/2020    Current Outpatient Medications  Medication Sig Dispense Refill   metFORMIN (GLUCOPHAGE) 500 MG tablet Take 1 tablet (500 mg total) by mouth 2 (two) times daily with a meal. 180  tablet 3   pantoprazole (PROTONIX) 40 MG tablet Take 1 tablet (40 mg total) by mouth daily. 90 tablet 3   rosuvastatin (CRESTOR) 10 MG tablet Take 1 tablet (10 mg total) by mouth daily. 90 tablet 3   No current facility-administered medications for this visit.    Allergies as of 04/01/2022   (No Known Allergies)    Family History  Problem Relation Age of Onset   Diabetes Mother    Hypertension Mother    Diabetes Father    Hypertension Father    Diabetes Paternal Aunt    Diabetes Paternal Grandmother    Anesthesia problems Neg Hx    Hearing loss Neg Hx    Other Neg Hx    Breast cancer Neg Hx      Physical Exam: General:   Alert,  well-nourished, pleasant and cooperative in NAD Head:  Normocephalic and atraumatic. Eyes:  Sclera clear, no icterus.   Conjunctiva pink. Abdomen:  Soft ,nontender, nondistended, normal bowel sounds, no rebound or guarding. No hepatosplenomegaly.  Abdominal pain is not reproducible. Neurologic:  Alert and  oriented x4;  grossly nonfocal Skin:  Intact without significant lesions or rashes. Psych:  Alert and cooperative. Normal mood and affect.   Brooke Diedrich L. Tarri Glenn, MD, MPH 04/01/2022, 9:39 AM

## 2022-04-01 NOTE — Patient Instructions (Addendum)
Fue un Oceanographer.  Contine con su pantoprazol 40 mg [place].  Probemos con 20 mg de diciclomina cuatro veces al da segn sea necesario para Conservation officer, historic buildings.  Hablamos de una tomografa computarizada para una evaluacin adicional. Se le ha programado una tomografa computarizada del abdomen y la pelvis en el primer piso de [place] programado para el jueves 18/01/24 a las 7:30 am. Debe llegar 15 minutos antes de la hora de su cita para Engineer, technical sales. Si tiene [employer] su examen o si necesita reprogramarlo, puede llamar a [facility] al [phone] [employer] 8:00 am y las 5:00 pm, de lunes a viernes.  No utilice ningn AINE.  Regreso en 6-8 semanas.  ___________________________________________________________________________  It was a pleasure to see you today.   Continue your pantoprazole 40 mg twice daily.  Let's try dicyclomine 20 mg four times daily as needed for pain.  We discussed a CT scan for further evaluation. You have been scheduled for a CT scan of the abdomen and pelvis at [facility], 1st floor Radiology. You are scheduled on Thursday 04/09/22 at 7:30 am. You should arrive 15 minutes prior to your appointment time for registration.  If you have any questions regarding your exam or if you need to reschedule, you may call [facility] at [phone] between the hours of 8:00 am and 5:00 pm, Monday-Friday.   Do not use any NSAIDs.  Return in 6-8 weeks.

## 2022-04-07 ENCOUNTER — Telehealth: Payer: Self-pay | Admitting: Licensed Clinical Social Worker

## 2022-04-07 NOTE — Telephone Encounter (Signed)
H&V Care Navigation CSW Progress Note  Clinical Social Worker  completed chart review  to see if pt ever completed assistance application. Note the following: CHARITY COMPLETE. PATIENT APPROVED FOR 100% FINANCIAL ASSISTANCE PER CAFA APPLICATION AND SUPPORTING DOCUMENTATION. CAFA APP SCANNED TO ACCOUNT [id number] APPROVAL DATES OF [employer] ---- 01/08/22 - 07/10/22 APPROVAL LETTER ON ACCOUNT [id number]    Patient is participating in a Managed Medicaid Plan:  No, approved for [place]: No Food Insecurity (06/19/2021)  Transportation Needs: No Transportation Needs (06/19/2021)  Depression (PHQ2-9): Low Risk  (12/10/2021)  Tobacco Use: Medium Risk (04/01/2022)

## 2022-04-09 ENCOUNTER — Ambulatory Visit (HOSPITAL_COMMUNITY)
Admission: RE | Admit: 2022-04-09 | Discharge: 2022-04-09 | Disposition: A | Discharge status: Home / Self Care | Payer: Self-pay | Source: Ambulatory Visit | Attending: Gastroenterology | Admitting: Gastroenterology

## 2022-04-09 ENCOUNTER — Encounter (HOSPITAL_COMMUNITY): Payer: Self-pay

## 2022-04-09 DIAGNOSIS — D72820 Lymphocytosis (symptomatic): Secondary | ICD-10-CM

## 2022-04-09 DIAGNOSIS — R1012 Left upper quadrant pain: Secondary | ICD-10-CM

## 2022-04-09 DIAGNOSIS — R7303 Prediabetes: Secondary | ICD-10-CM

## 2022-04-09 LAB — POCT I-STAT CREATININE: Creatinine, Ser: 0.7 mg/dL (ref 0.44–1.00)

## 2022-04-09 MED ORDER — IOHEXOL 9 MG/ML PO SOLN
500.0000 mL | ORAL | Status: AC
  Administered 2022-04-09 (×2): 500 mL via ORAL

## 2022-04-09 MED ORDER — IOHEXOL 300 MG/ML  SOLN
100.0000 mL | Freq: Once | INTRAMUSCULAR | Status: AC | PRN
  Administered 2022-04-09: 100 mL via INTRAVENOUS

## 2022-04-10 ENCOUNTER — Ambulatory Visit: Payer: Self-pay | Attending: Internal Medicine | Admitting: Internal Medicine

## 2022-04-10 ENCOUNTER — Other Ambulatory Visit: Payer: Self-pay

## 2022-04-10 ENCOUNTER — Encounter: Payer: Self-pay | Admitting: Internal Medicine

## 2022-04-10 VITALS — BP 112/70 | HR 70 | Temp 98.0°F | Ht 63.0 in | Wt 179.0 lb

## 2022-04-10 DIAGNOSIS — R7303 Prediabetes: Secondary | ICD-10-CM | POA: Insufficient documentation

## 2022-04-10 DIAGNOSIS — E782 Mixed hyperlipidemia: Secondary | ICD-10-CM | POA: Insufficient documentation

## 2022-04-10 DIAGNOSIS — G441 Vascular headache, not elsewhere classified: Secondary | ICD-10-CM | POA: Insufficient documentation

## 2022-04-10 DIAGNOSIS — G5601 Carpal tunnel syndrome, right upper limb: Secondary | ICD-10-CM | POA: Insufficient documentation

## 2022-04-10 DIAGNOSIS — R519 Headache, unspecified: Secondary | ICD-10-CM | POA: Insufficient documentation

## 2022-04-10 DIAGNOSIS — Z2821 Immunization not carried out because of patient refusal: Secondary | ICD-10-CM

## 2022-04-10 DIAGNOSIS — Z7984 Long term (current) use of oral hypoglycemic drugs: Secondary | ICD-10-CM | POA: Insufficient documentation

## 2022-04-10 DIAGNOSIS — Z79899 Other long term (current) drug therapy: Secondary | ICD-10-CM | POA: Insufficient documentation

## 2022-04-10 DIAGNOSIS — E236 Other disorders of pituitary gland: Secondary | ICD-10-CM | POA: Insufficient documentation

## 2022-04-10 DIAGNOSIS — G5603 Carpal tunnel syndrome, bilateral upper limbs: Secondary | ICD-10-CM | POA: Insufficient documentation

## 2022-04-10 MED ORDER — SUMATRIPTAN SUCCINATE 50 MG PO TABS
ORAL_TABLET | ORAL | 0 refills | Status: DC
  Filled 2022-04-10: qty 10, 5d supply, fill #0
  Filled 2022-04-30: qty 10, 30d supply, fill #0

## 2022-04-10 MED ORDER — TOPIRAMATE 25 MG PO TABS
25.0000 mg | ORAL_TABLET | Freq: Every day | ORAL | 1 refills | Status: DC
  Filled 2022-04-10 – 2022-04-30 (×2): qty 30, 30d supply, fill #0

## 2022-04-10 NOTE — Patient Instructions (Signed)
Sndrome del tnel carpiano Carpal Tunnel Syndrome  El sndrome del tnel carpiano es una afeccin que causa dolor, adormecimiento y debilidad en la mano y los dedos. El tnel carpiano es un rea estrecha ubicada en el lado palmar de la [place]. Los movimientos repetidos de la mueca o determinadas enfermedades pueden causar la hinchazn del tnel. Esta hinchazn comprime el nervio principal de la [place]. El nervio principal de la [place] se llama "nervio mediano". Cules son las causas? Esta afeccin puede ser causada por lo siguiente: Movimientos repetidos y enrgicos de la [place] y Insurance risk surveyor. Lesiones en la [place]. Artritis. Un quiste o un tumor en el tnel carpiano. Acumulacin de lquido Solicitor. Uso de herramientas que vibran. A veces, se desconoce la causa de esta afeccin. Qu incrementa el riesgo? Los siguientes factores pueden hacer que sea ms propenso a Armed forces training and education officer afeccin: Tener un trabajo que requiera que mueva la mueca o la mano repetitivamente o enrgicamente o que utilice herramientas que vibran. Estos pueden ser, [employer], los trabajos que implican usar una computadora, trabajar en una lnea de ensamblaje o trabajar con herramientas elctricas como taladros y lijadoras. Ser mujer. Tener ciertas afecciones, tales como: Diabetes. Obesidad. Tiroides hipoactiva (hipotiroidismo). Insuficiencia renal. Artritis reumatoide. Cules son los signos o sntomas? Los sntomas de esta afeccin incluyen: Sensacin de hormigueo en los dedos de la mano, especialmente el pulgar, el ndice y el dedo [place]. Hormigueo o adormecimiento en la mano. Sensacin de Social research officer, government en todo el brazo, especialmente cuando la [place] y el codo estn flexionados durante [place]. Dolor en la mueca que sube por el brazo hasta el hombro. Dolor que baja hasta la palma de la mano o los dedos. Sensacin de [employer]. Tal vez tenga dificultad para tomar y Licensed conveyancer. Los  sntomas pueden empeorar durante la noche. Cmo se diagnostica? Esta afeccin se diagnostica mediante los antecedentes mdicos y un examen fsico. Tambin pueden hacerle estudios, que incluyen los siguientes: Un electromiograma (EMG). Esta prueba mide las seales elctricas que los nervios les envan a los msculos. Estudio de [employer]. Este estudio permite determinar si las seales elctricas pasan correctamente por los nervios. Estudios de diagnstico por imgenes, como radiografas, una ecografa y Public house manager (RM). Estos estudios Chiropodist las posibles causas de la afeccin. Cmo se trata? El tratamiento de esta afeccin puede incluir: Cambios en el estilo de vida. Es importante que deje o cambie la actividad que caus la afeccin. Hacer ejercicio y actividades para fortalecer y Training and development officer los msculos y los tendones (fisioterapia). Hacer cambios en el estilo de vida que lo ayuden con su afeccin y aprender a Optometrist sus actividades diarias de forma segura (terapia ocupacional). Analgsicos y antiinflamatorios. Esto puede incluir medicamentos que se inyectan en la [place]. Una frula o un dispositivo ortopdico para la [place]. [place]. Siga estas instrucciones en su casa: Si tiene una frula o un dispositivo ortopdico: Use la frula o el dispositivo ortopdico como se lo haya indicado el mdico. Quteselos solamente como se lo haya indicado el mdico. Afloje la frula o el dispositivo ortopdico si los dedos de las manos se le adormecen, siente hormigueos o se le enfran y se tornan de Optician, dispensing. Mantenga la frula o el dispositivo ortopdico limpios. Si la frula o el dispositivo ortopdico no son impermeables: No deje que se mojen. Cbralos con un envoltorio hermtico cuando tome un bao de inmersin o una ducha. Control del dolor, la rigidez y la hinchazn Si se lo  indican, aplique hielo sobre la zona dolorida. Para hacer esto: Si tiene una frula o un  dispositivo ortopdico desmontable, quteselos como se lo haya indicado el mdico. Ponga el hielo en una bolsa plstica. Coloque una [employer] piel y la [place], o entre la frula o dispositivo ortopdico y Therapist, nutritional. Aplique el hielo durante 20 minutos, 2 o 3 veces por da. No se quede dormido con la bolsa de hielo [employer]. Retire el hielo si la piel se pone de color rojo brillante. Esto es PepsiCo. Si no puede sentir dolor, calor o fro, tiene un mayor riesgo de que se dae la zona. Mueva los dedos con frecuencia para reducir la rigidez y la hinchazn. Instrucciones generales Use los medicamentos de venta libre y los recetados solamente como se lo haya indicado el mdico. Descanse la [place] y la mano de toda actividad que le cause dolor. Si la afeccin tiene relacin con Leander Rams, hable con su empleador [employer] pueden [place], por [place], usar una almohadilla para apoyar la mueca mientras tipea. Haga los ejercicios como se lo hayan indicado el mdico, el fisioterapeuta o el terapeuta ocupacional. Cumpla con todas las visitas de seguimiento. Esto es importante. Comunquese con un mdico si: Aparecen nuevos sntomas. El dolor no se alivia con los [employer]. Sus sntomas empeoran. Solicite ayuda de inmediato si: Tiene hormigueo o adormecimiento intensos en la mueca o la mano. Resumen El sndrome del tnel carpiano es una afeccin que causa dolor, adormecimiento y debilidad en la mano y los dedos. Generalmente se debe a movimientos repetidos de [employer]. El sndrome del tnel carpiano se trata mediante cambios en el estilo de vida y medicamentos. Tambin puede indicarse la [place]. Siga las instrucciones del mdico sobre el uso de una frula, el descanso de la [place], la asistencia a las visitas de seguimiento y Solicitor para pedir ayuda. Esta informacin no tiene Marine scientist el consejo del mdico. Asegrese de hacerle al mdico cualquier pregunta  que tenga. Document Revised: 08/25/2019 Document Reviewed: 08/25/2019 Elsevier Patient Education  [place].

## 2022-04-10 NOTE — Progress Notes (Signed)
Patient ID: Brooke Lam, female    DOB: 1980-09-20  MRN: [id number]  CC: Follow-up (Pre-diabetes f/u. /Numbness of hands X4 mo. Pain on back R side of head, painful to move side to side X1 mo. /No to flu vax. )   Subjective: Brooke Lam is a 42 y.o. female who presents for f/u Her concerns today include:  Pt with hx of hyperTG, PreDM, abdominal pannus, palpitations (normal echo and Holter.  Declined nightly propranolol by cardiology), right ovarian lesion (followed by GYN Dr. Jodi Mourning 3/23 and 01/13/2022.  Endometrioma versus complicated hemorrhagic cyst.  Plan for follow-up ultrasound in 1 year), chronic left upper quadrant pain (followed by GI Dr. Tarri Glenn), meralgia paresthetica LL limb  Patient presents with concern with numbness in both hands x 4 months. Intermittently and more so at nights. Mainly 2-5th fingers. Wakes her up. No pain in hands Some pain in elbows sometimes when asleep Does housekeeping  C/o pain RT occipital area for almost a mth Sometimes it radiates to RT forehead and causes lacrimation.  No conjunctival ingestion Constant but intensity varies Worse when she sits up from laying down.  Worse when laying down especially on RT side. Some nausea.  No photophobia.  No blurred vision Pounding in character Massaging head and forehead calms it down Took Tylenol x 2.  Helped only little. Hx of partially Empty Sella [place] Had seen Dr. Loretta Plume 01/2021 for chronic HA.  Started on Topamax; finished that rxn and was not sure whether she was to continue the medication or not. She feels this HA is different.  Has appt with him 04/2022.  Wonders if she needs imaging study   Patient with history of prediabetes.  She would like to be rechecked and also have cholesterol level rechecked. Patient Active Problem List   Diagnosis Date Noted   Mixed hyperlipidemia 07/15/2021   Prediabetes 07/15/2021   Fibroadenoma of breast, right 05/11/2019   Dyspareunia  11/12/2014   S/P emergency cesarean hysterectomy 08/01/2012   Dysuria 09/17/2011     Current Outpatient Medications on File Prior to Visit  Medication Sig Dispense Refill   dicyclomine (BENTYL) 20 MG tablet Take 1 tablet (20 mg total) by mouth 4 (four) times daily as needed for spasms. 120 tablet 3   metFORMIN (GLUCOPHAGE) 500 MG tablet Take 1 tablet (500 mg total) by mouth 2 (two) times daily with a meal. 180 tablet 3   pantoprazole (PROTONIX) 40 MG tablet Take 1 tablet (40 mg total) by mouth 2 (two) times daily. 180 tablet 3   rosuvastatin (CRESTOR) 10 MG tablet Take 1 tablet (10 mg total) by mouth daily. 90 tablet 3   No current facility-administered medications on file prior to visit.    No Known Allergies  Social History   Socioeconomic History   Marital status: Single    Spouse name: Not on file   Number of children: 4   Years of education: Not on file   Highest education level: High school graduate  Occupational History   Occupation: bartender  Tobacco Use   Smoking status: Former    Types: Cigarettes    Quit date: 2005    Years since quitting: 19.0   Smokeless tobacco: Never  Vaping Use   Vaping Use: Never used  Substance and Sexual Activity   Alcohol use: Yes    Alcohol/week: 0.0 standard drinks of alcohol    Comment: every 8 days 5-8 drinks of wine and whiskey   Drug use: No  Sexual activity: Yes    Birth control/protection: None, Surgical  Other Topics Concern   Not on file  Social History Narrative   Not on file   Social Determinants of Health   Financial Resource Strain: Not on file  Food Insecurity: No Food Insecurity (06/19/2021)   Hunger Vital Sign    Worried About Running Out of Food in the Last Year: Never true    Ran Out of Food in the Last Year: Never true  Transportation Needs: No Transportation Needs (06/19/2021)   PRAPARE - Hydrologist (Medical): No    Lack of Transportation (Non-Medical): No  Physical  Activity: Not on file  Stress: Not on file  Social Connections: Not on file  Intimate Partner Violence: Not on file    Family History  Problem Relation Age of Onset   Diabetes Mother    Hypertension Mother    Diabetes Father    Hypertension Father    Diabetes Paternal Aunt    Diabetes Paternal Grandmother    Anesthesia problems Neg Hx    Hearing loss Neg Hx    Other Neg Hx    Breast cancer Neg Hx     Past Surgical History:  Procedure Laterality Date   ABDOMINAL HYSTERECTOMY     BREAST BIOPSY Right 2013   FA   CESAREAN SECTION     C/S x 4   UPPER GASTROINTESTINAL ENDOSCOPY  04/23/2020    ROS: Review of Systems Negative except as stated above  PHYSICAL EXAM: BP 112/70 (BP Location: Left Arm, Patient Position: Sitting, Cuff Size: Normal)   Pulse 70   Temp 98 F (36.7 C) (Oral)   Ht '5\' 3"'$  (1.6 m)   Wt 179 lb (81.2 kg)   LMP 10/08/2011   SpO2 98%   BMI 31.71 kg/m   Physical Exam  General appearance - alert, well appearing, and in no distress Mental status - normal mood, behavior, speech, dress, motor activity, and thought processes Eyes -no conjunctival injection. Neurological - cranial nerves II through XII intact, motor and sensory grossly normal bilaterally Musculoskeletal -no muscle wasting noted of the hands.  Grip 5/5 bilaterally.  Tinel's sign negative.      Latest Ref Rng & Units 04/09/2022   12:14 PM 07/28/2021    5:11 PM 05/23/2021    9:17 AM  CMP  Glucose 70 - 99 mg/dL  100  98   BUN 6 - 20 mg/dL  10  14   Creatinine 0.44 - 1.00 mg/dL 0.70  0.61  0.72   Sodium 135 - 145 mmol/L  138  139   Potassium 3.5 - 5.1 mmol/L  3.5  4.2   Chloride 98 - 111 mmol/L  104  101   CO2 22 - 32 mmol/L  26  24   Calcium 8.9 - 10.3 mg/dL  9.2  9.4   Total Protein 6.0 - 8.5 g/dL   7.3   Total Bilirubin 0.0 - 1.2 mg/dL   0.4   Alkaline Phos 44 - 121 IU/L   75   AST 0 - 40 IU/L   18   ALT 0 - 32 IU/L   13    Lipid Panel     Component Value Date/Time   CHOL 248 (H)  06/24/2021 1419   TRIG 218 (H) 06/24/2021 1419   HDL 42 06/24/2021 1419   CHOLHDL 5.9 (H) 06/24/2021 1419   LDLCALC 165 (H) 06/24/2021 1419    CBC  Component Value Date/Time   WBC 8.1 12/04/2021 1442   WBC 6.5 07/28/2021 1711   RBC 4.55 12/04/2021 1442   RBC 4.21 07/28/2021 1711   HGB 12.4 12/04/2021 1442   HCT 36.9 12/04/2021 1442   PLT 289 12/04/2021 1442   MCV 81 12/04/2021 1442   MCH 27.3 12/04/2021 1442   MCH 27.8 07/28/2021 1711   MCHC 33.6 12/04/2021 1442   MCHC 32.5 07/28/2021 1711   RDW 14.3 12/04/2021 1442   LYMPHSABS 2.4 12/04/2021 1442   MONOABS 0.4 07/28/2021 1711   EOSABS 0.1 12/04/2021 1442   BASOSABS 0.0 12/04/2021 1442    ASSESSMENT AND PLAN: 1. Bilateral carpal tunnel syndrome Diagnosis discussed.  Printed information given.  We will try conservative therapy with cock up wrist splint.  We only had 1 wrist splint.  Advised that she can purchase 1 for the other hand at any medical supply store.  Advised to wear the wrist splint at nights.  2. Other vascular headache Migraine versus cluster.  We will put her back on Topamax at bedtime for prophylaxis.  Will give a trial of Imitrex.  I went over with her how to take the medication.  Keep upcoming appointment with Dr. Tomi Likens - topiramate (TOPAMAX) 25 MG tablet; Take 1 tablet (25 mg total) by mouth at bedtime.  Dispense: 30 tablet; Refill: 1 - SUMAtriptan (IMITREX) 50 MG tablet; Take 1 tablet by mouth at start of headache.  May repeat in 2 hrs if no relief.  Max 2 tabs/24 hr.  Dispense: 10 tablet; Refill: 0 - CT HEAD WO CONTRAST (5MM); Future  3. Empty sella ([place])  4. Mixed hyperlipidemia - Lipid panel - Hepatic Function Panel  5. Prediabetes Encourage healthy eating habits. - Hemoglobin A1c  6. Influenza vaccination declined Recommended.  Patient declined.   [employer] Language interpreter used during this encounter. #[place], [id number]  Patient was given the opportunity to ask questions.  Patient verbalized  understanding of the plan and was able to repeat key elements of the plan.   This documentation was completed using Radio producer.  Any transcriptional errors are unintentional.  No orders of the defined types were placed in this encounter.    Requested Prescriptions    No prescriptions requested or ordered in this encounter    No follow-ups on file.  [name], MD, FACP

## 2022-04-11 ENCOUNTER — Other Ambulatory Visit: Payer: Self-pay | Admitting: Internal Medicine

## 2022-04-11 LAB — LIPID PANEL
Chol/HDL Ratio: 4 ratio (ref 0.0–4.4)
Cholesterol, Total: 144 mg/dL (ref 100–199)
HDL: 36 mg/dL — ABNORMAL LOW (ref >39)
LDL Chol Calc (NIH): 86 mg/dL (ref 0–99)
Triglycerides: 124 mg/dL (ref 0–149)
VLDL Cholesterol Cal: 22 mg/dL (ref 5–40)

## 2022-04-11 LAB — HEPATIC FUNCTION PANEL
ALT: 8 IU/L (ref 0–32)
AST: 21 IU/L (ref 0–40)
Albumin: 5.1 g/dL — ABNORMAL HIGH (ref 3.9–4.9)
Alkaline Phosphatase: 77 IU/L (ref 44–121)
Bilirubin Total: 0.3 mg/dL (ref 0.0–1.2)
Bilirubin, Direct: 0.12 mg/dL (ref 0.00–0.40)
Total Protein: 7.9 g/dL (ref 6.0–8.5)

## 2022-04-11 LAB — HEMOGLOBIN A1C
Est. average glucose Bld gHb Est-mCnc: 134 mg/dL
Hgb A1c MFr Bld: 6.3 % — ABNORMAL HIGH (ref 4.8–5.6)

## 2022-04-16 ENCOUNTER — Ambulatory Visit (HOSPITAL_COMMUNITY)
Admission: RE | Admit: 2022-04-16 | Discharge: 2022-04-16 | Disposition: A | Discharge status: Home / Self Care | Payer: Self-pay | Source: Ambulatory Visit | Attending: Internal Medicine | Admitting: Internal Medicine

## 2022-04-16 DIAGNOSIS — G441 Vascular headache, not elsewhere classified: Secondary | ICD-10-CM | POA: Insufficient documentation

## 2022-04-17 ENCOUNTER — Other Ambulatory Visit: Payer: Self-pay

## 2022-04-21 ENCOUNTER — Other Ambulatory Visit: Payer: Self-pay

## 2022-04-21 DIAGNOSIS — R1012 Left upper quadrant pain: Secondary | ICD-10-CM

## 2022-04-21 DIAGNOSIS — K5900 Constipation, unspecified: Secondary | ICD-10-CM

## 2022-04-21 MED ORDER — LINACLOTIDE 145 MCG PO CAPS: 145.0000 ug | ORAL_CAPSULE | Freq: Every day | ORAL | 0 refills | Status: DC

## 2022-04-30 ENCOUNTER — Other Ambulatory Visit: Payer: Self-pay | Admitting: Gastroenterology

## 2022-04-30 ENCOUNTER — Other Ambulatory Visit: Payer: Self-pay

## 2022-04-30 DIAGNOSIS — K5900 Constipation, unspecified: Secondary | ICD-10-CM

## 2022-04-30 DIAGNOSIS — R1012 Left upper quadrant pain: Secondary | ICD-10-CM

## 2022-04-30 MED ORDER — LINACLOTIDE 145 MCG PO CAPS
145.0000 ug | ORAL_CAPSULE | Freq: Every day | ORAL | 1 refills | Status: DC
  Filled 2022-04-30: qty 30, 30d supply, fill #0

## 2022-05-07 ENCOUNTER — Other Ambulatory Visit: Payer: Self-pay
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
## 2022-05-15 ENCOUNTER — Ambulatory Visit (INDEPENDENT_AMBULATORY_CARE_PROVIDER_SITE_OTHER): Payer: Self-pay | Admitting: Neurology

## 2022-05-15 ENCOUNTER — Encounter: Payer: Self-pay | Admitting: Neurology

## 2022-05-15 VITALS — BP 128/83 | HR 80 | Ht 63.0 in | Wt 176.0 lb

## 2022-05-15 DIAGNOSIS — M542 Cervicalgia: Secondary | ICD-10-CM

## 2022-05-15 NOTE — Patient Instructions (Addendum)
Consulte fisioterapia. Seguimiento 5 meses.

## 2022-05-19 ENCOUNTER — Other Ambulatory Visit: Payer: Self-pay

## 2022-05-19 ENCOUNTER — Ambulatory Visit: Payer: Self-pay | Attending: Neurology | Admitting: Physical Therapy

## 2022-05-19 ENCOUNTER — Encounter: Payer: Self-pay | Admitting: Physical Therapy

## 2022-05-19 DIAGNOSIS — R293 Abnormal posture: Secondary | ICD-10-CM | POA: Insufficient documentation

## 2022-05-19 DIAGNOSIS — M6281 Muscle weakness (generalized): Secondary | ICD-10-CM | POA: Insufficient documentation

## 2022-05-19 DIAGNOSIS — M542 Cervicalgia: Secondary | ICD-10-CM | POA: Insufficient documentation
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
## 2022-05-19 NOTE — Patient Instructions (Signed)
    Voncille Lo, PT, [place] Certified Exercise Expert for the Aging Adult  05/19/22 12:39 PM Phone: [phone] Fax: [phone]

## 2022-05-20 ENCOUNTER — Emergency Department (HOSPITAL_BASED_OUTPATIENT_CLINIC_OR_DEPARTMENT_OTHER): Payer: Self-pay

## 2022-05-20 ENCOUNTER — Emergency Department (HOSPITAL_BASED_OUTPATIENT_CLINIC_OR_DEPARTMENT_OTHER)
Admission: EM | Admit: 2022-05-20 | Discharge: 2022-05-20 | Disposition: A | Discharge status: Home / Self Care | Payer: Self-pay | Attending: Emergency Medicine | Admitting: Emergency Medicine

## 2022-05-20 ENCOUNTER — Other Ambulatory Visit: Payer: Self-pay

## 2022-05-20 ENCOUNTER — Encounter (HOSPITAL_BASED_OUTPATIENT_CLINIC_OR_DEPARTMENT_OTHER): Payer: Self-pay | Admitting: Emergency Medicine

## 2022-05-20 DIAGNOSIS — U071 COVID-19: Secondary | ICD-10-CM | POA: Insufficient documentation

## 2022-05-20 HISTORY — DX: Hyperlipidemia, unspecified: E78.5

## 2022-05-20 LAB — TROPONIN I (HIGH SENSITIVITY)
Troponin I (High Sensitivity): 2 ng/L (ref <18)
Troponin I (High Sensitivity): 2 ng/L (ref <18)

## 2022-05-20 LAB — CBC
HCT: 37 % (ref 36.0–46.0)
Hemoglobin: 12.3 g/dL (ref 12.0–15.0)
MCH: 27.1 pg (ref 26.0–34.0)
MCHC: 33.2 g/dL (ref 30.0–36.0)
MCV: 81.5 fL (ref 80.0–100.0)
Platelets: 268 10*3/uL (ref 150–400)
RBC: 4.54 MIL/uL (ref 3.87–5.11)
RDW: 13.2 % (ref 11.5–15.5)
WBC: 8 10*3/uL (ref 4.0–10.5)
nRBC: 0 % (ref 0.0–0.2)

## 2022-05-20 LAB — RESP PANEL BY RT-PCR (RSV, FLU A&B, COVID)  RVPGX2
Influenza A by PCR: NEGATIVE
Influenza B by PCR: NEGATIVE
Resp Syncytial Virus by PCR: NEGATIVE
SARS Coronavirus 2 by RT PCR: POSITIVE — AB

## 2022-05-20 LAB — HEPATIC FUNCTION PANEL
ALT: 13 U/L (ref 0–44)
AST: 28 U/L (ref 15–41)
Albumin: 4.5 g/dL (ref 3.5–5.0)
Alkaline Phosphatase: 59 U/L (ref 38–126)
Bilirubin, Direct: 0.1 mg/dL (ref 0.0–0.2)
Indirect Bilirubin: 0.6 mg/dL (ref 0.3–0.9)
Total Bilirubin: 0.7 mg/dL (ref 0.3–1.2)
Total Protein: 8.1 g/dL (ref 6.5–8.1)

## 2022-05-20 LAB — BASIC METABOLIC PANEL
Anion gap: 10 (ref 5–15)
BUN: 17 mg/dL (ref 6–20)
CO2: 25 mmol/L (ref 22–32)
Calcium: 9.1 mg/dL (ref 8.9–10.3)
Chloride: 100 mmol/L (ref 98–111)
Creatinine, Ser: 0.84 mg/dL (ref 0.44–1.00)
GFR, Estimated: >60 mL/min (ref >60)
Glucose, Bld: 100 mg/dL — ABNORMAL HIGH (ref 70–99)
Potassium: 3.4 mmol/L — ABNORMAL LOW (ref 3.5–5.1)
Sodium: 135 mmol/L (ref 135–145)

## 2022-05-20 LAB — LIPASE, BLOOD: Lipase: 40 U/L (ref 11–51)

## 2022-05-20 MED ORDER — SODIUM CHLORIDE 0.9 % IV BOLUS
1000.0000 mL | Freq: Once | INTRAVENOUS | Status: AC
  Administered 2022-05-20: 1000 mL via INTRAVENOUS

## 2022-05-20 MED ORDER — PAXLOVID (300/100) 20 X 150 MG & 10 X 100MG PO TBPK: 3.0000 | ORAL_TABLET | Freq: Two times a day (BID) | ORAL | 0 refills | Status: AC

## 2022-05-20 MED ORDER — IOHEXOL 350 MG/ML SOLN
75.0000 mL | Freq: Once | INTRAVENOUS | Status: AC | PRN
  Administered 2022-05-20: 75 mL via INTRAVENOUS

## 2022-05-20 MED ORDER — ALBUTEROL SULFATE HFA 108 (90 BASE) MCG/ACT IN AERS
2.0000 | INHALATION_SPRAY | Freq: Once | RESPIRATORY_TRACT | Status: AC
  Administered 2022-05-20: 2 via RESPIRATORY_TRACT

## 2022-05-20 NOTE — Discharge Instructions (Signed)
You have COVID-19.  I recommend that you take Paxlovid as prescribed  You also need to use albuterol every 4 hours as needed for cough and wheezing  You need to isolate for 5 days and then wear a mask around others for another 5 days  See your doctor for follow-up  Return to ER if you have worse shortness of breath or cough or trouble breathing

## 2022-05-20 NOTE — ED Provider Notes (Signed)
[facility] Provider Note   CSN: [id number] Arrival date & time: 05/20/22  1620     History  Chief Complaint  Patient presents with   Chest Pain    Brooke Lam is a 42 y.o. female history of IBS, here presenting with chest pain.  Patient states that she had substernal chest pain that is pleuritic in nature since yesterday.  Patient states that she has some subjective shortness of breath as well.  Denies any cough.  Patient denies any recent travel.  Patient has seen GI previously for IBS but denies any abdominal pain or vomiting.  The history is provided by the patient.       Home Medications Prior to Admission medications   Medication Sig Start Date End Date Taking? Authorizing Provider  dicyclomine (BENTYL) 20 MG tablet Take 1 tablet (20 mg total) by mouth 4 (four) times daily as needed for spasms. 04/01/22   Thornton Park, MD  linaclotide [facility]) 145 MCG CAPS capsule Take 1 capsule (145 mcg total) by mouth daily before breakfast. 04/30/22 06/29/22  Thornton Park, MD  pantoprazole (PROTONIX) 40 MG tablet Take 1 tablet (40 mg total) by mouth 2 (two) times daily. 04/01/22   Thornton Park, MD  rosuvastatin (CRESTOR) 10 MG tablet Take 1 tablet (10 mg total) by mouth daily. Patient not taking: Reported on 05/19/2022 07/17/21   Tobb, Godfrey Pick, DO  SUMAtriptan (IMITREX) 50 MG tablet Take 1 tablet by mouth at start of headache.  May repeat in 2 hours if no relief.  Max 2 tabs/24 hr. Patient not taking: Reported on 05/19/2022 04/10/22   Ladell Pier, MD      Allergies    Patient has no known allergies.    Review of Systems   Review of Systems  Cardiovascular:  Positive for chest pain.  All other systems reviewed and are negative.   Physical Exam Updated Vital Signs BP 130/86 (BP Location: Right Arm)   Pulse (!) 101   Temp 98.5 F (36.9 C) (Oral)   Resp 18   Ht '5\' 3"'$  (1.6 m)   Wt 79.8 kg   LMP 10/08/2011    SpO2 98%   BMI 31.18 kg/m  Physical Exam Vitals and nursing note reviewed.  Constitutional:      Comments: Slightly tachypneic  HENT:     Head: Normocephalic.  Eyes:     Extraocular Movements: Extraocular movements intact.     Pupils: Pupils are equal, round, and reactive to light.  Cardiovascular:     Rate and Rhythm: Normal rate and regular rhythm.     Heart sounds: Normal heart sounds.  Pulmonary:     Comments: Slightly tachypneic but no wheezing or crackles Abdominal:     General: Bowel sounds are normal.     Palpations: Abdomen is soft.  Musculoskeletal:        General: Normal range of motion.     Cervical back: Normal range of motion and neck supple.  Skin:    General: Skin is warm.     Capillary Refill: Capillary refill takes less than 2 seconds.  Neurological:     General: No focal deficit present.     Mental Status: She is oriented to person, place, and time.  Psychiatric:        Mood and Affect: Mood normal.        Behavior: Behavior normal.     ED Results / Procedures / Treatments   Labs (all labs  ordered are listed, but only abnormal results are displayed) Labs Reviewed  RESP PANEL BY RT-PCR (RSV, FLU A&B, COVID)  [id number]  CBC  BASIC METABOLIC PANEL  HEPATIC FUNCTION PANEL  LIPASE, BLOOD  TROPONIN I (HIGH SENSITIVITY)    EKG EKG Interpretation  Date/Time:  Wednesday May 20 2022 16:35:25 EST Ventricular Rate:  93 PR Interval:  119 QRS Duration: 84 QT Interval:  341 QTC Calculation: 425 R Axis:   74 Text Interpretation: Sinus rhythm Borderline short PR interval No significant change since last tracing Confirmed by Wandra Arthurs [phone]) on 05/20/2022 4:49:35 PM  Radiology DG Chest 2 View  Result Date: 05/20/2022 CLINICAL DATA:  Chest pain starting yesterday.  Radiation to back. EXAM: CHEST - 2 VIEW COMPARISON:  Chest radiographs 04/08/2021 FINDINGS: Cardiac silhouette and mediastinal contours are within normal limits. The lungs are clear. No  pleural effusion or pneumothorax. Mild-to-moderate multilevel degenerative disc changes of the thoracic spine. IMPRESSION: No active cardiopulmonary disease. Electronically Signed   By: Yvonne Kendall M.D.   On: 05/20/2022 16:52    Procedures Procedures    Medications Ordered in ED Medications  sodium chloride 0.9 % bolus 1,000 mL (1,000 mLs Intravenous New Bag/Given 05/20/22 1704)    ED Course/ Medical Decision Making/ A&P                             Medical Decision Making Brooke Lam is a 42 y.o. female here presenting with shortness of breath and chest pain.  Considered PE and patient has previous positive D-dimer so we will get a CTA chest.  Also consider reflux versus COVID versus flu versus pneumonia.  Plan to get CBC and CMP and troponin x 2 and CTA chest.  8:37 PM Reviewed patient's labs independently interpreted imaging studies.  Troponin negative x 2.  CTA is suboptimal but no large PE.  COVID is positive likely explaining her shortness of breath.  Patient has no oxygen requirement.  Will discharge home with albuterol as needed and Paxlovid.  Problems Addressed: COVID-19: acute illness or injury  Amount and/or Complexity of Data Reviewed Labs: ordered. Decision-making details documented in ED Course. Radiology: ordered and independent interpretation performed. Decision-making details documented in ED Course.  Risk Prescription drug management.   Final Clinical Impression(s) / ED Diagnoses Final diagnoses:  None    Rx / DC Orders ED Discharge Orders     None         Drenda Freeze, MD 05/20/22 2038

## 2022-05-20 NOTE — ED Notes (Signed)
Called lab for addon.

## 2022-05-20 NOTE — ED Triage Notes (Signed)
Pt c/o middle CP that started yesterday w/ radiation to back; felt weak when she was walking; CP described as tightness

## 2022-05-27 ENCOUNTER — Encounter: Payer: Self-pay | Admitting: Gastroenterology

## 2022-05-27 ENCOUNTER — Other Ambulatory Visit: Payer: Self-pay | Admitting: Obstetrics and Gynecology

## 2022-05-27 ENCOUNTER — Other Ambulatory Visit: Payer: Self-pay | Admitting: Internal Medicine

## 2022-05-27 ENCOUNTER — Ambulatory Visit (INDEPENDENT_AMBULATORY_CARE_PROVIDER_SITE_OTHER): Payer: Self-pay | Admitting: Gastroenterology

## 2022-05-27 VITALS — BP 110/76 | HR 68 | Ht 63.0 in | Wt 172.0 lb

## 2022-05-27 DIAGNOSIS — Z1231 Encounter for screening mammogram for malignant neoplasm of breast: Secondary | ICD-10-CM

## 2022-05-27 DIAGNOSIS — N83201 Unspecified ovarian cyst, right side: Secondary | ICD-10-CM

## 2022-05-27 DIAGNOSIS — R932 Abnormal findings on diagnostic imaging of liver and biliary tract: Secondary | ICD-10-CM

## 2022-05-27 DIAGNOSIS — K5900 Constipation, unspecified: Secondary | ICD-10-CM

## 2022-05-27 DIAGNOSIS — R1012 Left upper quadrant pain: Secondary | ICD-10-CM

## 2022-05-27 MED ORDER — LUBIPROSTONE 24 MCG PO CAPS: 24.0000 ug | ORAL_CAPSULE | Freq: Two times a day (BID) | ORAL | 3 refills | Status: DC

## 2022-05-27 NOTE — Patient Instructions (Addendum)
Quiero que hagas un seguimiento con tu gineclogo.  Le he recomendado [employer] para dar seguimiento a las lesiones hepticas. Probablemente sean benignos.  Le recomiendo que coma al [employer] 25 y 63 gramos [place] y beba al menos 52 onzas de agua al da. Querr aumentar gradualmente la fibra en su dieta para evitar la hinchazn. Puede aumentar el contenido de Venus a travs de la dieta y de suplementos de [place], incluidos psyllium y Human resources officer.  Los laxantes naturales incluyen ciruelas pasas, manzanas, albaricoques, cerezas, melocotones, peras, aloe, ruibarbo, kiwi, pltanos, mango, papaya y sanda. En particular, se ha demostrado que dos kiwi al da causan menos probabilidades de causar hinchazn que las ciruelas pasas o el psyllium.  Siempre que tenga riones sanos, otra opcin es Risk manager suplementos de xido de [place]. Recomiendo comenzar con 500 mg al da. Puede aumentar la dosis a 1000 mg al da despus de una semana si eso no parece ayudar.  Hablamos de probar Amitiza o Linzess, dependiendo de cul sea ms barato.  Hablamos de una colonoscopia dados sus sntomas y preocupaciones actuales.  _______________________________________________________________________  I want you to follow-up with your gynecologist.  I have recommended an MRI to follow-up on the liver lesions. These are likely benign.   I recommend that you eat at least 25-30 grams of fiber daily and drink at least 64 ounces of water daily. You will want to gradually increase the fiber in your diet to avoid bloating. You may increase the fiber through diet and through fiber supplements including psyllium and methycellulose.   Natural laxatives include prunes, apples, apricots, cherries, peaches, pears, aloe, rhubarb, kiwi, bananas, mango, papaya, and watermelon. In particular, two kiwi a day has been show to cause less likely to cause bloating than prunes or psyllium.  As long as you have  healthy kidneys, another options is using magnesium oxide supplements. I recommend starting with 500 mg daily. You could increase the dose to 1000 mg daily after one week if that doesn't seem to be helping.   We discussed trying Amitiza or Linzess - depending on which is cheaper.   We discussed a colonoscopy given your ongoing symptoms and concerns.

## 2022-05-27 NOTE — Progress Notes (Signed)
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 Ernst Bowler with reassurance provided    - DQ2/DQ8 negative   LUQ abdominal pain may be due to gastritis/duodenitis but it has not resolved despite PPI BID. Metformin may be contributing. Considering IBS given the chronicity and the concurrent constipation. CT scan overall showed constipation may be contributing, no additional etiologies identified.  Colonoscopy recommended to exclude over etiologies including IBD.  Right ovarian cyst. May be contributing to symptoms. Referral to GYN.   Abnormal liver on CT. Likely benign. Plan MRI to further characterize the lesions.  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 Avoid all NSAIDs. Consider repeat EGD with biopsies if symptoms persist.   PLAN: - Continue pantoprazole 40 mg BID and dicyclomine 20 mg QID - Trial Amitiza 66mg BID instead of Linzess due to the cost - Colonoscopy with a 2 day bowel prep - MRI of the liver to follow-up on abnormal CT in 3-4 months - Referral to GYN to follow-up on the ovarian cyst - Office follow-up 6-8 weeks, earlier if needed   Please see the "Patient Instructions" section for addition details about the plan.  HPI: Brooke Lam a 42y.o. female who returns in follow-up for the evaluation of abdominal pain. She was last seen 04/01/22. The interval history is obtained through the patient with the assistance of a spanish interpreter.   At time of initial consultation she was reporting a  one year history of intermittent 5/10 LUQ pain with associated gas, nausea and bloating. Triggered by fatty and greasy foods. No change with position or defecation. No other identified exacerbating or relieving features. No alarm features.  Some constipation that is diet related. 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   Labs 06/06/19: normal liver enzymes Labs 09/20/19: WBC 7.5, hgb 11.9, MCV 86.2, platelets 252  Evaluation of symptoms includes: - EGD 04/23/2020: chronic duodenitis, intraepithelial lymphocytosis, gastropathy - Normal liver enzymes and lipase - TTGA <1.0 - IgA low at 70; IgG, IgM, and IgE are normal - Abdominal ultrasound 04/29/20: echogenic liver, no gallstones, normal exam - HIDA 05/14/20: Negative exam, GBEF 45% - Food allergy evaluation with Dr. GErnst Bowlerwas negative - Celiac HLA DQ2 and DQ8 testing negative 01/28/22 - CT abd/pelvis 04/09/22: moderate stool in the colon, a 5.5 x 5.2 cm right ovarian lesion, and a 151mhypodension lesion in the liver dome  On office visit 01/28/22 her symptoms were well controlled on pantoprazole. When she stopped the medication the symptoms returned. She feels like her stomach is inflamed. Symptoms improved with mint tea. Stable bowel habits with one stool every 2-3 days. No NSAIDs.    On office follow-up 04/01/22 she continued to have some pain that comes and goes despite taking pantoprazole 40 mg BID. She has intermittent nausea, LUQ pain, and bloating. No diarrhea or constipation. No blood in  the stool. Weight is stable. Appetite is good.   She has a bowel movement every 3 days since she was a child.  Symptoms were evaluated by hr GYN 3/23. He identified a hemorrhagic cyst versus endometrioma. She was provided reassurance and told to follow-up PRN. Notes from that visit were  reviewed.   CT abd/pelvis 04/09/22 showed moderate stool in the colon, a 5.5 x 5.2 cm right ovarian lesion, and a 14m hypodension lesion in the liver dome. MRI recommended by the radiologist.   She returns today after a trial of dicylomine 20 mg QID. The pain has largely improved with treatment although she had trouble getting the Linzess prescription due to cost. But she continues to have symptoms. Asking about a colonoscopy today.    Past Medical History:  Diagnosis Date   Anemia    Blood transfusion without reported diagnosis    Ectopic pregnancy    Hyperlipidemia    Hypertension    Preterm labor     Past Surgical History:  Procedure Laterality Date   ABDOMINAL HYSTERECTOMY     BREAST BIOPSY Right 2013   FA   CESAREAN SECTION     C/S x 4   UPPER GASTROINTESTINAL ENDOSCOPY  04/23/2020    Current Outpatient Medications  Medication Sig Dispense Refill   dicyclomine (BENTYL) 20 MG tablet Take 1 tablet (20 mg total) by mouth 4 (four) times daily as needed for spasms. 120 tablet 3   lubiprostone (AMITIZA) 24 MCG capsule Take 1 capsule (24 mcg total) by mouth 2 (two) times daily with a meal. 60 capsule 3   pantoprazole (PROTONIX) 40 MG tablet Take 1 tablet (40 mg total) by mouth 2 (two) times daily. 180 tablet 3   rosuvastatin (CRESTOR) 10 MG tablet Take 1 tablet (10 mg total) by mouth daily. 90 tablet 3   SUMAtriptan (IMITREX) 50 MG tablet Take 1 tablet by mouth at start of headache.  May repeat in 2 hours if no relief.  Max 2 tabs/24 hr. (Patient taking differently: Take 1 tablet by mouth at start of headache.  May repeat in 2 hours if no relief.  Max 2 tabs/24 hr.) 10 tablet 0   No current facility-administered medications for this visit.    Allergies as of 05/27/2022   (No Known Allergies)    Family History  Problem Relation Age of Onset   Diabetes Mother    Hypertension Mother    Diabetes Father    Hypertension Father    Diabetes Paternal Aunt    Diabetes Paternal  Grandmother    Anesthesia problems Neg Hx    Hearing loss Neg Hx    Other Neg Hx    Breast cancer Neg Hx      Physical Exam: General:   Alert,  well-nourished, pleasant and cooperative in NAD Head:  Normocephalic and atraumatic. Eyes:  Sclera clear, no icterus.   Conjunctiva pink. Abdomen:  Soft ,nontender, nondistended, normal bowel sounds, no rebound or guarding. No hepatosplenomegaly.  Abdominal pain is not reproducible. Liver edge is not palpable.  Neurologic:  Alert and  oriented x4;  grossly nonfocal Skin:  Intact without significant lesions or rashes. Psych:  Alert and cooperative. Normal mood and affect.   Ninoska Goswick L. BTarri Glenn MD, MPH 05/27/2022, 10:57 AM

## 2022-05-28 NOTE — Therapy (Signed)
OUTPATIENT PHYSICAL THERAPY TREATMENT NOTE   Patient Name: Brooke Lam MRN: [id number] DOB:[birth year], 42 y.o., female Today's Date: 06/02/2022  PCP: Ladell Pier, MD   REFERRING PROVIDER: Pieter Partridge, DO    END OF SESSION:   PT End of Session - 06/02/22 1556     Visit Number 2    Number of Visits 8    Date for PT Re-Evaluation 07/14/22    Authorization Type MCD Nilda Riggs CAFA?    PT Start Time [id number]    PT Stop Time 1630    PT Time Calculation (min) 41 min    Activity Tolerance Patient tolerated treatment well;Patient limited by pain    Behavior During Therapy WFL for tasks assessed/performed             Past Medical History:  Diagnosis Date   Anemia    Blood transfusion without reported diagnosis    Ectopic pregnancy    Hyperlipidemia    Hypertension    Preterm labor    Past Surgical History:  Procedure Laterality Date   ABDOMINAL HYSTERECTOMY     BREAST BIOPSY Right 2013   FA   CESAREAN SECTION     C/S x 4   UPPER GASTROINTESTINAL ENDOSCOPY  04/23/2020   Patient Active Problem List   Diagnosis Date Noted   Cervix still in place 06/02/2022   Language barrier 06/01/2022   History of ovarian cyst 06/01/2022   PCB (post coital bleeding) 06/01/2022   Empty sella ([place]) 04/10/2022   Bilateral carpal tunnel syndrome 04/10/2022   Cephalalgia 04/10/2022   Mixed hyperlipidemia 07/15/2021   Prediabetes 07/15/2021   Fibroadenoma of breast, right 05/11/2019   Dyspareunia 11/12/2014   Dysuria 09/17/2011    REFERRING DIAG: M54.2 (ICD-10-CM) - Cervicalgia    THERAPY DIAG:  Cervicalgia  Muscle weakness (generalized)  Abnormal posture  Rationale for Evaluation and Treatment Rehabilitation  PERTINENT HISTORY: HTN  PRECAUTIONS: none  SUBJECTIVE:                                                                                                                                                                                      SUBJECTIVE  STATEMENT:  With interpreter, 4-5/10.   EVAL - I fell asleep after drinking ETOH which I rarely do. My neck was forced forward for about 6 hours.  I have had pain ever since.  I tried to massage my own neck but nothing helped. I cannot hold my grandbaby or sleep on my RT side.  I can't turn my head to drive   PAIN:  Are you having pain? Yes: NPRS scale: 5/10 at rest and at worst 9/10 Pain location:  neck RT side Pain description: aching Aggravating factors: driving and turning neck, sleeps on stomach with head turned to left to avoid right, not sleeping on the right Relieving factors: massage herself and stretching Works as a Paediatric nurse.  Hard to lift grandchildren  20 lb   OBJECTIVE: (objective measures completed at initial evaluation unless otherwise dated)   DIAGNOSTIC FINDINGS: 04-16-22  COMPARISON:  11/20/2020   FINDINGS: Brain: No evidence of acute infarction, hemorrhage, hydrocephalus, extra-axial collection or mass lesion/mass effect.   Vascular: No hyperdense vessel or unexpected calcification.   Skull: Normal. Negative for fracture or focal lesion.   Sinuses/Orbits: No acute finding.   IMPRESSION: No acute intracranial process.   PATIENT SURVEYS:  NDI 8/50  16 %   COGNITION: Overall cognitive status: Within functional limits for tasks assessed   SENSATION: WFL   POSTURE: rounded shoulders and forward head   PALPATION: Pt with TTP on RT neck paraspinals and special tenderness over C-4/5         CERVICAL ROM:    Active ROM A/PROM (deg) eval  Flexion 57  Extension 21  Right lateral flexion 24  Left lateral flexion 18  Right rotation 52  Left rotation 32   (Blank rows = not tested)   UPPER EXTREMITY ROM:  Pt WNL all planes of UE   Active ROM Right eval Left eval  Shoulder flexion 168 170  Shoulder extension      Shoulder abduction      Shoulder adduction      Shoulder extension      Shoulder internal rotation       Shoulder external rotation      Elbow flexion      Elbow extension      Wrist flexion      Wrist extension      Wrist ulnar deviation      Wrist radial deviation      Wrist pronation      Wrist supination       (Blank rows = not tested)   UPPER EXTREMITY MMT: WFL  Grossly 4 to 5/5   MMT Right eval Left eval  Shoulder flexion 4 5  Shoulder extension      Shoulder abduction 4 5  Shoulder adduction      Shoulder extension      Shoulder internal rotation 5 5  Shoulder external rotation 4 5  Middle trapezius      Lower trapezius      Elbow flexion      Elbow extension      Wrist flexion      Wrist extension      Wrist ulnar deviation      Wrist radial deviation      Wrist pronation      Wrist supination      Grip strength       (Blank rows = not tested)   CERVICAL SPECIAL TESTS:  Neck flexor muscle endurance test: Pt able to sustain 22 sec (normal 37 sec), Spurling's test: Negative, and Distraction test: Negative   FUNCTIONAL TESTS:  5 times sit to stand: 9:38 sec   TODAY'S TREATMENT:   [facility] Adult PT Treatment:                                                DATE: 06-02-22 Therapeutic Exercise:  Supine Deep Neck Flexor  10 x 10 sec hold  towel supporting neck 3 hold Seated Cervical Retraction Protraction AROM 10 reps UT Stretch   2 reps RT and LT 30 hold Levator Scapulae Stretch   2 reps RT and LT 30 sec hold Shoulder External Rotation and Scapular Retraction with RTB   3 x 10  Shoulder Extension with RTB  3 x 10 Shoulder Diagonal Horizontal Abduction with RTB  3 x 10 reps Shoulder Horizontal Abduction with RTB 3 x 10 Manual Therapy: STW to RT UT and LS and paraspinal cervical muscles.  Pt with over stretch in rotation with cavitation.  Trigger Point Dry Needling Treatment: Pre-treatment instruction: Patient instructed on dry needling rationale, procedures, and possible side effects including pain during treatment (achy,cramping feeling), bruising, drop of blood,  lightheadedness, nausea, sweating. Patient Consent Given: Yes Education handout provided: Previously provided Muscles treated: RT UT, LS, C4/5 cervical paraspinal  Needle size and number: .30x17m x 4 Electrical stimulation performed: No Parameters: N/A Treatment response/outcome: Twitch response elicited and Palpable decrease in muscle tension Post-treatment instructions: Patient instructed to expect possible mild to moderate muscle soreness later today and/or tomorrow. Patient instructed in methods to reduce muscle soreness and to continue prescribed HEP. If patient was dry needled over the lung field, patient was instructed on signs and symptoms of pneumothorax and, however unlikely, to see immediate medical attention should they occur. Patient was also educated on signs and symptoms of infection and to seek medical attention should they occur. Patient verbalized understanding of these instructions and education.                                                                                                                         DATE: EVAL and issue of information on TPDN and HEP    PATIENT EDUCATION:  Education details: POC , Explanation of findings , HEP and information on TPDN Person educated: Patient and interpreter Education method: Explanation, Demonstration, Tactile cues, Verbal cues, and Handouts Education comprehension: verbalized understanding, returned demonstration, verbal cues required, tactile cues required, and needs further education   HOME EXERCISE PROGRAM: Access Code: [email or web address]: [email or web address] Date: 06/02/2022 Prepared by: LVoncille Lo Exercises - Supine Deep Neck Flexor Training  - 2-3 x daily - 7 x weekly - 10 reps - 3 hold - Seated Cervical Retraction Protraction AROM  - 2-3 x daily - 7 x weekly - 1 sets - 10 reps - Seated Gentle Upper Trapezius Stretch  - 1 x daily - 7 x weekly - 1 sets - 3 reps - 30 hold - Gentle Levator Scapulae  Stretch  - 1 x daily - 7 x weekly - 3 sets - 3 reps - 30 hold - Shoulder External Rotation and Scapular Retraction with Resistance  - 2 x daily - 7 x weekly - 3 sets - 10 reps - Shoulder Extension with Resistance - Palms Forward  - 1 x daily - 7 x weekly - 3 sets -  10 reps - Standing Shoulder Diagonal Horizontal Abduction 60/120 Degrees with Resistance  - 1 x daily - 7 x weekly - 3 sets - 10 reps - Standing Shoulder Horizontal Abduction with Resistance  - 1 x daily - 7 x weekly - 3 sets - 10 reps   ASSESSMENT:   CLINICAL IMPRESSION: Pt returns with [employer] interpreter and complains of continued RT UT and LS pain but some better than eval.  Pain 4-5/10 at initiation of RX. Pt explained about precautions and procedure for TPDN and she consents.  Pt was closely monitored and given aftercare instructions verbally. Pt was able to participate after TPDN with upgraded HEP.  Pt needs mod cuing to keep from elevating RT upper trap.   Will continue to progress toward goal achievement and increased strengthening.   EVAL: Patient is a 42 y.o. female who was seen today for physical therapy evaluation and treatment for cervicalgia for past 3 months from sleeping in sustained hyperflexed position for 6 hours and continues to have pain while turning neck to RT and difficulty sleeping and carrying items with increased neck pain.  Pt will benefit from skilled PT to address impairments. 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    OBJECTIVE IMPAIRMENTS: decreased ROM, decreased strength, impaired UE functional use, postural dysfunction, obesity, and pain.    ACTIVITY LIMITATIONS: carrying, lifting, and sleeping   PARTICIPATION LIMITATIONS: driving and carrying grandchildren   PERSONAL FACTORS: 1 comorbidity: HTN  are also affecting patient's functional outcome.    REHAB POTENTIAL: Good   CLINICAL DECISION MAKING: Stable/uncomplicated   EVALUATION COMPLEXITY: Low     GOALS: Goals reviewed with  patient? Yes   SHORT TERM GOALS: Target date: May 23, 2022   Independent with initial HEP Baseline: no knowledge Goal status: INITIAL   2.  Pt will decreased pain with driving from [id number] to 5/10 Baseline: 9/10 turning while driving Goal status: INITIAL   3.  Demonstrate understanding of proper sitting posture, body mechanics, work ergonomics, and be more conscious of position and posture throughout the day.  Baseline: no knowledge Goal status: INITIAL       LONG TERM GOALS: Target date: 07-14-22   Demonstrate and verbalize techniques to reduce the risk of re-injury including: lifting, posture, body mechanics Baseline: no knowledge Goal status: INITIAL   2.  Pt will be independent with advanced HEP.  Baseline: no knowledge Goal status: INITIAL   3.  Improved cervical rotation to allow checking blind spots while driving with minimal discomfort Baseline: Pain with driving [id number] Goal status: INITIAL   4.  Pt will be able to sleep on RT side without waking in night for sleep Baseline: Pt is unable to sleep at night on RT side due to pain Goal status: INITIAL   5.  Pt will decreased NDI from 8/50 to at least 4/10 Baseline: 8/50 Goal status: INITIAL   6.  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 Baseline: Pt unable to carry grandchild due to pain Goal status: INITIAL     PLAN:   PT FREQUENCY: 1x/week   PT DURATION: 8 weeks   PLANNED INTERVENTIONS: Therapeutic exercises, Therapeutic activity, Neuromuscular re-education, Balance training, Gait training, Patient/Family education, Self Care, Joint mobilization, Dry Needling, Cryotherapy, Moist heat, Taping, Manual therapy, and Re-evaluation   PLAN FOR NEXT SESSION: possible TPDN review HEP  Voncille Lo, PT, [place] Certified Exercise Expert for the Aging Adult  06/02/22 4:38 PM Phone: [phone] Fax:  [phone]

## 2022-06-01 ENCOUNTER — Encounter: Payer: Self-pay | Admitting: Obstetrics and Gynecology

## 2022-06-01 ENCOUNTER — Ambulatory Visit (INDEPENDENT_AMBULATORY_CARE_PROVIDER_SITE_OTHER): Payer: Self-pay | Admitting: Obstetrics and Gynecology

## 2022-06-01 VITALS — BP 120/82 | HR 71 | Wt 170.7 lb

## 2022-06-01 DIAGNOSIS — N889 Noninflammatory disorder of cervix uteri, unspecified: Secondary | ICD-10-CM

## 2022-06-01 DIAGNOSIS — N930 Postcoital and contact bleeding: Secondary | ICD-10-CM

## 2022-06-01 DIAGNOSIS — R109 Unspecified abdominal pain: Secondary | ICD-10-CM

## 2022-06-01 DIAGNOSIS — Z789 Other specified health status: Secondary | ICD-10-CM | POA: Insufficient documentation

## 2022-06-01 DIAGNOSIS — Z8742 Personal history of other diseases of the female genital tract: Secondary | ICD-10-CM

## 2022-06-01 MED ORDER — NORETHINDRONE 0.35 MG PO TABS: 1.0000 | ORAL_TABLET | Freq: Every day | ORAL | 0 refills | Status: DC

## 2022-06-01 MED ORDER — NORGESTIMATE-ETH ESTRADIOL 0.25-35 MG-MCG PO TABS: 1.0000 | ORAL_TABLET | Freq: Every day | ORAL | 0 refills | Status: DC

## 2022-06-01 NOTE — Progress Notes (Unsigned)
Obstetrics and Gynecology New Patient Evaluation  Appointment Date: 06/01/2022  OBGYN Clinic: [facility]  Primary Care Provider: Ladell Pier  Referring Provider: Ladell Pier, MD  Chief Complaint: Right ovarian cyst (?endometrioma), right sided pelvic pain  History of Present Illness: Brooke Lam is a 42 y.o. Hispanic [phone] (Patient's last menstrual period was 10/08/2011.), seen for the above chief complaint. Her past medical history is significant for multiple c-sections and h/o need for c-hyst due to accreta in 2014, pre-diabetes, hyperlipidemia, migraines, BMI 30  Starting about seven months ago, she states that she's had rlq discomfort that comes and goes. It happens about 2-3x/week and lasts for about 3-4 hours each time and rubbing her belly helps; she states that physical activity can cause it to come on and sexual intercourse.  She has been seeing GI and a recent CT scan showed a 5-6cm RO cyst (see below).     Review of Systems: Pertinent items noted in HPI and remainder of comprehensive ROS otherwise negative.   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   Dyspareunia 11/12/2014   Dysuria 09/17/2011    Past Medical History:  Past Medical History:  Diagnosis Date   Anemia    Blood transfusion without reported diagnosis    Ectopic pregnancy    Hyperlipidemia    Hypertension    Preterm labor     Past Surgical History:  Past Surgical History:  Procedure Laterality Date   ABDOMINAL HYSTERECTOMY     BREAST BIOPSY Right 2013   FA   CESAREAN SECTION     C/S x 4   UPPER  GASTROINTESTINAL ENDOSCOPY  04/23/2020    Past Obstetrical History:  OB History  Gravida Para Term Preterm AB Living  '7 5 3 2 2 4  '$ SAB IAB Ectopic Multiple Live Births  1 0 1 0 4    # Outcome Date GA Lbr Len/2nd Weight Sex Delivery Anes PTL Lv  7 Preterm 04/28/12 [redacted]w[redacted]d 2 lb 6 oz (1.077 kg) M CS-Unspec   LIV     Complications: Placenta Previa  6 Term 07/30/04    F CS-LTranv  N LIV  5 Term 01/26/03    F CS-LTranv  N LIV     Birth Comments: repeat  4 Term 12/01/95    F CS-LTranv  N LIV     [place] labor- didn't dilate  3 Preterm           2 SAB              Birth Comments: System Generated. Please review and update pregnancy details.  1 Ectopic              Birth Comments: MTX- 2009    Past Gynecological History: As per HPI. History of Pap Smear(s): Yes.   Last pap 04/2020, which was negative and hpv negative  Social History:  Social History   Socioeconomic History   Marital status: Single    Spouse name: Not on file   Number of children: 4   Years of education: Not on file   Highest education level: High school graduate  Occupational History   Occupation: bartender  Tobacco Use   Smoking status: Former    Types: Cigarettes    Quit date: 2005    Years since quitting: 19.2   Smokeless tobacco: Never  Vaping Use   Vaping Use: Never used  Substance and Sexual Activity   Alcohol use: Yes    Alcohol/week: 0.0 standard drinks of alcohol    Comment: every 8 days 5-8 drinks of wine and whiskey   Drug use: No   Sexual activity: Yes    Birth control/protection: None, Surgical  Other Topics Concern   Not on file  Social History Narrative   Right Handed    Drinks no caffeine    Social Determinants of Health   Financial Resource Strain: Not on file  Food Insecurity: No Food Insecurity (06/01/2022)   Hunger Vital Sign    Worried About Running Out of Food in the Last Year: Never true    Ran Out of Food in the Last Year: Never true   Transportation Needs: No Transportation Needs (06/01/2022)   PRAPARE - Hydrologist (Medical): No    Lack of Transportation (Non-Medical): No  Physical Activity: Not on file  Stress: Not on file  Social Connections: Not on file  Intimate Partner Violence: Not on file    Family History:  Family History  Problem Relation Age of Onset   Diabetes Mother    Hypertension Mother    Diabetes Father    Hypertension Father    Diabetes Paternal Aunt    Diabetes Paternal Grandmother    Anesthesia problems Neg Hx    Hearing loss Neg Hx    Other Neg Hx    Breast cancer Neg Hx     Medications Brooke Lam had no medications administered during this visit. Current Outpatient Medications  Medication Sig Dispense Refill   dicyclomine (BENTYL) 20 MG tablet Take 1 tablet (20 mg total) by mouth 4 (four) times daily as needed for spasms. 120 tablet 3   lubiprostone (AMITIZA) 24 MCG capsule Take 1 capsule (24 mcg total) by mouth 2 (two) times daily with a meal. 60 capsule 3   pantoprazole (PROTONIX) 40 MG tablet Take 1 tablet (40 mg total) by mouth 2 (two) times daily. 180 tablet 3   rosuvastatin (CRESTOR) 10 MG tablet Take 1 tablet (10 mg total) by mouth daily. 90 tablet 3   SUMAtriptan (IMITREX) 50 MG tablet Take 1 tablet by mouth at start of headache.  May repeat in 2 hours if no relief.  Max 2 tabs/24 hr. (Patient taking differently: Take 1 tablet by mouth at start of headache.  May repeat in 2 hours if no relief.  Max 2 tabs/24 hr.) 10 tablet 0   No current facility-administered medications for this visit.    Allergies Patient has no known allergies.   Physical Exam:  BP 120/82   Pulse 71   Wt 170 lb 11.2 oz (77.4 kg)   LMP 10/08/2011   BMI 30.24 kg/m  Body mass index is 30.24 kg/m. General appearance: Well nourished, well developed female in no acute distress.  Neck:  Supple, normal appearance, and no thyromegaly  Cardiovascular: normal s1  and s2.  No murmurs, rubs or gallops. Respiratory:  Clear to auscultation bilateral. Normal respiratory effort Abdomen: soft, nttp, nd Neuro/Psych:  Normal mood and affect.  Skin:  Warm and dry.   Cervical exam deferred  Laboratory: no new labs  Radiology: as per HPI Narrative &  Impression  CLINICAL DATA:  Two years left upper quadrant abdominal pain, lymphocytosis.   EXAM: CT ABDOMEN AND PELVIS WITH CONTRAST   TECHNIQUE: Multidetector CT imaging of the abdomen and pelvis was performed using the standard protocol following bolus administration of intravenous contrast.   RADIATION DOSE REDUCTION: This exam was performed according to the departmental dose-optimization program which includes automated exposure control, adjustment of the mA and/or kV according to patient size and/or use of iterative reconstruction technique.   CONTRAST:  169m OMNIPAQUE IOHEXOL 300 MG/ML  SOLN   COMPARISON:  CT June 06, 2019.   FINDINGS: Lower chest: No acute abnormality.   Hepatobiliary: Bilobed 16 mm hypodense lesion in the dome of the liver on image 14/2 versus 2 small adjacent hypodense lesions measuring 6 and 7 mm respectively on image 14/2. Gallbladder is unremarkable. No biliary ductal dilation.   Pancreas: No pancreatic ductal dilation or evidence of acute inflammation.   Spleen: No splenomegaly.   Adrenals/Urinary Tract: Bilateral adrenal glands appear normal. No hydronephrosis. Kidneys demonstrate symmetric enhancement. Urinary bladder is unremarkable for degree of distension.   Stomach/Bowel: Radiopaque enteric contrast material traverses the splenic flexure. Stomach is unremarkable for degree of distension. No pathologic dilation of small or large bowel. The appendix is not confidently identified however there is no pericecal inflammation. No evidence of acute bowel inflammation. Moderate volume of formed stool in the distal colon with gas fluid levels in the  proximal colon.   Vascular/Lymphatic: Normal caliber abdominal aorta. Smooth IVC contours. No pathologically enlarged abdominal or pelvic lymph nodes.   Reproductive: Uterus and left adnexa are unremarkable. Well-circumscribed 5.5 x 5.2 cm right ovarian lesion which demonstrates questionable internal heterogeneity previously evaluated on pelvic ultrasound including May 29, 2021 favored to reflect an endometrioma or less likely complicated/hemorrhagic cyst with recommendation for yearly sonographic follow-up.   Other: Significant abdominopelvic free fluid   Musculoskeletal: No acute osseous abnormality. Thoracolumbar spondylosis with multilevel Schmorl's node formation.   IMPRESSION: 1. Moderate volume of formed stool in the distal colon with gas fluid levels in the proximal colon, suggestive of constipation with recent laxative use versus constipation associated diarrhea. 2. Well-circumscribed 5.5 x 5.2 cm right ovarian lesion which demonstrates questionable internal heterogeneity previously evaluated on pelvic ultrasound including May 29, 2021 favored to reflect an endometrioma or less likely complicated/hemorrhagic cyst with recommendation for yearly sonographic follow-up. 3. Bilobed 16 mm hypodense lesion in the dome of the liver versus 2 small adjacent hypodense lesions measuring 6 and 7 mm respectively, incompletely characterized on this examination but favored to reflect a benign etiology such as hepatic cysts or hemangiomas. Consider more definitive characterization with nonemergent hepatic protocol MRI with and without contrast.     Electronically Signed   By: JDahlia BailiffM.D.   On: 04/09/2022 13:30   Narrative & Impression  CLINICAL DATA:  Postcoital bleeding for more than 10 years; history of caesarian section x 4, hysterectomy   EXAM: TRANSABDOMINAL AND TRANSVAGINAL ULTRASOUND OF PELVIS   TECHNIQUE: Both transabdominal and transvaginal ultrasound  examinations of the pelvis were performed. Transabdominal technique was performed for global imaging of the pelvis including uterus, ovaries, adnexal regions, and pelvic cul-de-sac. It was necessary to proceed with endovaginal exam following the transabdominal exam to visualize the ovaries.   COMPARISON:  10/31/2014   FINDINGS: Uterus   Surgically absent. Heterogenous retained cervix with a few nabothian cysts. No focal mass.   Endometrium   Not visualized, likely obscured by bowel   Right ovary  Measurements: 7.3 x 3.1 x 5.7 cm = volume: 68.3 mL. Small dominant follicle; no follow-up imaging recommended. Additional complicated cystic mass 5.1 x 4.0 x 4.8 cm, containing numerous mobile internal echoes, question endometrioma or less likely complicated/hemorrhagic cyst, decreased in size since 2021 when it measured 7.8 x 6.6 x 6.0 cm.   Left ovary   Measurements: 4.1 x 3.3 x 2.7 cm = volume: 21.5 mL. Dominant follicle without additional mass; no follow-up imaging recommended.   Other findings   Free pelvic fluid.  No other masses.   IMPRESSION: Complex cystic mass of the RIGHT ovary 5.1 cm greatest size, decreased in size since 2016 and most likely representing an endometrioma or less likely complicated/hemorrhagic cyst; yearly follow-up sonographic imaging recommended.   Remainder of exam unremarkable.     Electronically Signed   By: Lavonia Dana M.D.   On: 05/29/2021 13:25   Assessment: patient stable  Plan:  1. History of ovarian cyst I told her that it's possible this could be a re-occurring RO cyst or, more than likely, a persistent since since 2016. I told her that we could proceed with surgery to evaluate the cyst but this has the potential to be a big surgery given her prior surgical history and potential stage 4 endometriosis. I told her I recommend OCPs and re-ultrasound in 3 months, in addition to getting a ca125 today. Pt is amenable to this. [employer]  sent in for patient - US PELVIC COMPLETE WITH TRANSVAGINAL; Future - CA 125  2. Abdominal pain, unspecified abdominal location - US PELVIC COMPLETE WITH TRANSVAGINAL; Future - CA 125  3. Language barrier In person interpreter used  4. PCB (post coital bleeding) S/p urogyn consult already  Orders Placed This Encounter  Procedures   US PELVIC COMPLETE WITH TRANSVAGINAL   CA 125    RTC 70mafter u/s   Return in about 1 month (around 07/02/2022) for in person, rn visit, bp check.  Future Appointments  Date Time Provider [place] 06/02/2022  3:45 PM BVanetta MuldersOB[facility] 06/04/2022  9:00 AM [place], KGerarda Gunther RD NDM-NMCH NDM  06/08/2022 10:00 AM WMC-WOCA NURSE [facility]WH [facility] 06/09/2022  3:00 PM BVanetta MuldersO[facility] 06/09/2022  4:00 PM BThornton Park MD LBGI-LEC LBPCEndo  06/16/2022  3:00 PM [facility]Vanetta Mul[facility]OS[facility] 06/23/2022  3:45 PM BDorothea Ogle PT [facility] 06/30/2022  8:45 AM BDorothea Ogle PT [facility] 07/02/2022 12:30 PM BCCCP CLINIC CHCC-OCO None  07/02/2022  1:30 PM GI-BCG MOBILE MM 1 GI-BCGMO GI-BREAST CE  07/07/2022  3:00 PM BVanetta MuldersOSt[facility] 07/14/2022  3:00 PM BVanetta MuldersOE[facility] 08/03/2022  8:30 AM JLadell Pier MD CHW-CHWW None  10/16/2022  9:50 AM JPieter Partridge DO LBN-LBNG None    CDurene RomansMD Attending Center for [employer]([facility]

## 2022-06-02 ENCOUNTER — Ambulatory Visit: Payer: Self-pay | Attending: Neurology | Admitting: Physical Therapy

## 2022-06-02 ENCOUNTER — Encounter: Payer: Self-pay | Admitting: Physical Therapy

## 2022-06-02 DIAGNOSIS — N889 Noninflammatory disorder of cervix uteri, unspecified: Secondary | ICD-10-CM | POA: Insufficient documentation

## 2022-06-02 DIAGNOSIS — R293 Abnormal posture: Secondary | ICD-10-CM | POA: Insufficient documentation

## 2022-06-02 DIAGNOSIS — M6281 Muscle weakness (generalized): Secondary | ICD-10-CM | POA: Insufficient documentation

## 2022-06-02 DIAGNOSIS — M542 Cervicalgia: Secondary | ICD-10-CM | POA: Insufficient documentation

## 2022-06-02 LAB — CA 125: Cancer Antigen (CA) 125: 10.7 U/mL (ref 0.0–38.1)

## 2022-06-04 ENCOUNTER — Ambulatory Visit: Payer: Self-pay | Admitting: Dietician

## 2022-06-08 ENCOUNTER — Ambulatory Visit: Payer: Self-pay

## 2022-06-08 ENCOUNTER — Telehealth: Payer: Self-pay | Admitting: Family Medicine

## 2022-06-08 NOTE — Telephone Encounter (Signed)
Called patient to let her that her appt had been moved to 07/02/22 @ 2:30pm.

## 2022-06-09 ENCOUNTER — Ambulatory Visit: Payer: Self-pay | Admitting: Physical Therapy

## 2022-06-09 ENCOUNTER — Ambulatory Visit (AMBULATORY_SURGERY_CENTER): Payer: Self-pay | Admitting: Gastroenterology

## 2022-06-09 ENCOUNTER — Encounter: Payer: Self-pay | Admitting: Gastroenterology

## 2022-06-09 VITALS — BP 104/52 | HR 60 | Resp 11

## 2022-06-09 DIAGNOSIS — R1012 Left upper quadrant pain: Secondary | ICD-10-CM

## 2022-06-09 MED ORDER — SODIUM CHLORIDE 0.9 % IV SOLN: 500.0000 mL | INTRAVENOUS | Status: DC

## 2022-06-09 NOTE — Progress Notes (Signed)
Pt's states no medical or surgical changes since previsit or office visit. 

## 2022-06-09 NOTE — Op Note (Addendum)
[place] Patient Name: Brooke Lam Procedure Date: 06/09/2022 3:45 PM MRN: [id number] Endoscopist: Thornton Park MD, MD, [id number] Age: 42 Referring MD:  Date of Birth: 1980-10-13 Gender: Female Account #: [id number] Procedure:                Colonoscopy Indications:              Abdominal pain Medicines:                Monitored Anesthesia Care Procedure:                Pre-Anesthesia Assessment:                           - Prior to the procedure, a History and Physical                            was performed, and patient medications and                            allergies were reviewed. The patient's tolerance of                            previous anesthesia was also reviewed. The risks                            and benefits of the procedure and the sedation                            options and risks were discussed with the patient.                            All questions were answered, and informed consent                            was obtained. Prior Anticoagulants: The patient has                            taken no anticoagulant or antiplatelet agents. ASA                            Grade Assessment: II - A patient with mild systemic                            disease. After reviewing the risks and benefits,                            the patient was deemed in satisfactory condition to                            undergo the procedure.                           After obtaining informed consent, the colonoscope  was passed under direct vision. Throughout the                            procedure, the patient's blood pressure, pulse, and                            oxygen saturations were monitored continuously. The                            Olympus CF-HQ190L SN [id number] was introduced through                            the anus and advanced to the 3 cm into the ileum. A                            second forward view of  the right colon was                            performed. The colonoscopy was performed without                            difficulty. The patient tolerated the procedure                            well. The quality of the bowel preparation was                            good. The terminal ileum, ileocecal valve,                            appendiceal orifice, and rectum were photographed. Scope In: 3:53:16 PM Scope Out: 4:09:25 PM Scope Withdrawal Time: 0 hours 13 minutes 23 seconds  Total Procedure Duration: 0 hours 16 minutes 9 seconds  Findings:                 The perianal and digital rectal examinations were                            normal except for external hemorrhoids.                           The entire examined colon appeared normal on direct                            and retroflexion views.                           The terminal ileum appeared normal.                           The exam was otherwise without abnormality on                            direct and retroflexion views. Complications:  No immediate complications. Estimated Blood Loss:     Estimated blood loss: none. Impression:               - Normal exam except for external hemorrhoids.                           - No specimens collected. Recommendation:           - Patient has a contact number available for                            emergencies. The signs and symptoms of potential                            delayed complications were discussed with the                            patient. Return to normal activities tomorrow.                            Written discharge instructions were provided to the                            patient.                           - Resume previous diet.                           - Continue present medications.                           - Repeat colonoscopy in 10 years for surveillance,                            earlier with new symptoms.                           -  Return to GI office at the next available                            appointment. Thornton Park MD, MD 06/09/2022 4:18:22 PM This report has been signed electronically.

## 2022-06-09 NOTE — Progress Notes (Unsigned)
Sedate, gd SR, tolerated procedure well, VSS, report to RN 

## 2022-06-09 NOTE — Patient Instructions (Addendum)
Resume previous diet Continue present medications Follow up visit 07/20/22- with Anderson Malta, 3rd floor There were no polyps seen today!  You will need another screening colonoscopy in 10 years, you will receive a letter at that time when you are due for the procedure.   Please call us at [phone] if you have a change in bowel habits, change in family history of colo-rectal cancer, rectal bleeding or other GI concern before that time. Information given for hemorrhoids  USTED TUVO UN PROCEDIMIENTO ENDOSCPICO HOY EN EL [facility]:   Lea el informe del procedimiento que se le entreg para cualquier pregunta especfica sobre lo que se Primary school teacher.  Si el informe del examen no responde a sus preguntas, por favor llame a su gastroenterlogo para aclararlo.  Si usted solicit que no se le den [employer] de lo que se Estate manager/land agent en su procedimiento al [employer] va a cuidar, entonces el informe del procedimiento se ha incluido en un sobre sellado para que usted lo revise despus cuando le sea ms conveniente.   LO QUE PUEDE ESPERAR: Algunas sensaciones de hinchazn en el abdomen.  Puede tener ms gases de lo normal.  El caminar puede ayudarle a eliminar el aire que se le puso en el tracto gastrointestinal durante el procedimiento y reducir la hinchazn.  Si le hicieron una endoscopia inferior (como una colonoscopia o una sigmoidoscopia flexible), podra notar manchas de sangre en las heces fecales o en el papel higinico.  Si se someti a una preparacin intestinal para su procedimiento, es posible que no tenga una evacuacin intestinal normal durante [employer].   Tenga en cuenta:  Es posible que note un poco de irritacin y congestin en la nariz o algn drenaje.  Esto es debido al oxgeno [employer] durante su procedimiento.  No hay que preocuparse y esto debe desaparecer ms o Scientist, research (medical).   SNTOMAS PARA REPORTAR INMEDIATAMENTE:  Despus de una endoscopia inferior  (colonoscopia):  Cantidades excesivas de sangre en las heces fecales  Sensibilidad significativa o empeoramiento de los dolores abdominales   Hinchazn aguda del abdomen que antes no tena   Fiebre de 100F o ms   Para asuntos urgentes o de Freight forwarder, puede comunicarse con un gastroenterlogo a cualquier hora llamando al [phone].  DIETA:  Recomendamos una comida pequea al principio, pero luego puede continuar con su dieta normal.  Tome muchos lquidos, Teacher, adult education las bebidas alcohlicas durante 24 horas.    ACTIVIDAD:  Debe planear tomarse las cosas con calma por el resto del da y no debe CONDUCIR ni usar maquinaria pesada Programmer, applications (debido a los medicamentos de sedacin utilizados durante el examen).     SEGUIMIENTO: Nuestro personal llamar al nmero que aparece en su historial al siguiente da hbil de su procedimiento para ver cmo se siente y para responder cualquier pregunta o inquietud que pueda tener con respecto a la informacin que se le dio despus del procedimiento. Si no podemos contactarle, le dejaremos un mensaje.  Sin embargo, si se siente bien y no tiene Paediatric nurse, no es necesario que nos devuelva la llamada.  Asumiremos que ha regresado a sus actividades diarias normales sin incidentes.  FIRMAS/CONFIDENCIALIDAD: Usted y/o el acompaante que le cuide han firmado documentos que se ingresarn en su historial mdico electrnico.  Estas firmas atestiguan el hecho de que la informacin anterior

## 2022-06-09 NOTE — Progress Notes (Signed)
Indication for colonoscopy: Left upper quadrant pain with associated nausea and vomiting not explained by EGD or CT  Please see my 05/27/2022 office note for complete details.  There is been no change in history or physical exam since that time.  The patient remains an appropriate candidate for monitored anesthesia care in the endoscopy center today.

## 2022-06-10 ENCOUNTER — Other Ambulatory Visit: Payer: Self-pay | Admitting: *Deleted

## 2022-06-10 ENCOUNTER — Telehealth: Payer: Self-pay | Admitting: *Deleted

## 2022-06-10 NOTE — Telephone Encounter (Signed)
Post procedure follow up placed but no answer and no VM.

## 2022-06-16 ENCOUNTER — Encounter: Payer: Self-pay | Admitting: Physical Therapy

## 2022-06-16 ENCOUNTER — Ambulatory Visit: Payer: Self-pay | Admitting: Physical Therapy

## 2022-06-16 DIAGNOSIS — M6281 Muscle weakness (generalized): Secondary | ICD-10-CM

## 2022-06-16 DIAGNOSIS — M542 Cervicalgia: Secondary | ICD-10-CM

## 2022-06-16 DIAGNOSIS — R293 Abnormal posture: Secondary | ICD-10-CM

## 2022-06-16 NOTE — Therapy (Addendum)
OUTPATIENT PHYSICAL THERAPY TREATMENT NOTE/Discharge Note PHYSICAL THERAPY DISCHARGE SUMMARY  Visits from Start of Care: 3  Current functional level related to goals / functional outcomes: Last known as indicated below   Remaining deficits: unknown   Education / Equipment: Initial HEP   Patient agrees to discharge. Patient goals were partially met. Patient is being discharged due to not returning since the last visit.    Patient Name: Brooke Lam MRN: [id number] DOB:14-Jun-1980, 42 y.o., female Today's Date: 06/16/2022  PCP: Marcine Matar, MD   REFERRING PROVIDER: Drema Dallas, DO    END OF SESSION:   PT End of Session - 06/16/22 1508     Visit Number 3    Number of Visits 8    Date for PT Re-Evaluation 07/14/22    Authorization Type MCD Lev[facility] CAFA?    PT Start Time 1508    PT Stop Time 1550    PT Time Calculation (min) 42 min    Activity Tolerance Patient tolerated treatment well    Behavior During Therapy WFL for tasks assessed/performed              Past Medical History:  Diagnosis Date   Anemia    Blood transfusion without reported diagnosis    Depression    Ectopic pregnancy    Hyperlipidemia    Hypertension    Preterm labor    Past Surgical History:  Procedure Laterality Date   ABDOMINAL HYSTERECTOMY     BREAST BIOPSY Right 2013   FA   CESAREAN SECTION     C/S x 4   UPPER GASTROINTESTINAL ENDOSCOPY  04/23/2020   Patient Active Problem List   Diagnosis Date Noted   Cervix still in place 06/02/2022   Language barrier 06/01/2022   History of ovarian cyst 06/01/2022   PCB (post coital bleeding) 06/01/2022   Empty sella (HCC) 04/10/2022   Bilateral carpal tunnel syndrome 04/10/2022   Cephalalgia 04/10/2022   Mixed hyperlipidemia 07/15/2021   Prediabetes 07/15/2021   Fibroadenoma of breast, right 05/11/2019   Dyspareunia 11/12/2014   Dysuria 09/17/2011    REFERRING DIAG: M54.2 (ICD-10-CM) - Cervicalgia    THERAPY  DIAG:  Cervicalgia  Muscle weakness (generalized)  Abnormal posture  Rationale for Evaluation and Treatment Rehabilitation  PERTINENT HISTORY: HTN  PRECAUTIONS: none  SUBJECTIVE:                                                                                                                                                                                      SUBJECTIVE STATEMENT:   with interpreter  2/10. I do not need TPDN today. EVAL - I fell asleep after drinking  ETOH which I rarely do. My neck was forced forward for about 6 hours.  I have had pain ever since.  I tried to massage my own neck but nothing helped. I cannot hold my grandbaby or sleep on my RT side.  I can't turn my head to drive   PAIN:  Are you having pain? Yes: NPRS scale: 5/10 at rest and at worst 9/10 Pain location: neck RT side Pain description: aching Aggravating factors: driving and turning neck, sleeps on stomach with head turned to left to avoid right, not sleeping on the right Relieving factors: massage herself and stretching Works as a Multimedia programmer.  Hard to lift grandchildren  20 lb   OBJECTIVE: (objective measures completed at initial evaluation unless otherwise dated)   DIAGNOSTIC FINDINGS: 04-16-22  COMPARISON:  11/20/2020   FINDINGS: Brain: No evidence of acute infarction, hemorrhage, hydrocephalus, extra-axial collection or mass lesion/mass effect.   Vascular: No hyperdense vessel or unexpected calcification.   Skull: Normal. Negative for fracture or focal lesion.   Sinuses/Orbits: No acute finding.   IMPRESSION: No acute intracranial process.   PATIENT SURVEYS:  NDI 8/50  16 %   COGNITION: Overall cognitive status: Within functional limits for tasks assessed   SENSATION: WFL   POSTURE: rounded shoulders and forward head   PALPATION: Pt with TTP on RT neck paraspinals and special tenderness over C-4/5         CERVICAL ROM:    Active ROM A/PROM  (deg) eval  Flexion 57  Extension 21  Right lateral flexion 24  Left lateral flexion 18  Right rotation 52  Left rotation 32   (Blank rows = not tested)   UPPER EXTREMITY ROM:  Pt WNL all planes of UE   Active ROM Right eval Left eval  Shoulder flexion 168 170  Shoulder extension      Shoulder abduction      Shoulder adduction      Shoulder extension      Shoulder internal rotation      Shoulder external rotation      Elbow flexion      Elbow extension      Wrist flexion      Wrist extension      Wrist ulnar deviation      Wrist radial deviation      Wrist pronation      Wrist supination       (Blank rows = not tested)   UPPER EXTREMITY MMT: WFL  Grossly 4 to 5/5   MMT Right eval Left eval  Shoulder flexion 4 5  Shoulder extension      Shoulder abduction 4 5  Shoulder adduction      Shoulder extension      Shoulder internal rotation 5 5  Shoulder external rotation 4 5  Middle trapezius      Lower trapezius      Elbow flexion      Elbow extension      Wrist flexion      Wrist extension      Wrist ulnar deviation      Wrist radial deviation      Wrist pronation      Wrist supination      Grip strength       (Blank rows = not tested)   CERVICAL SPECIAL TESTS:  Neck flexor muscle endurance test: Pt able to sustain 22 sec (normal 37 sec), Spurling's test: Negative, and Distraction test: Negative  FUNCTIONAL TESTS:  5 times sit to stand: 9:38 sec   TODAY'S TREATMENT:  [facility] Adult PT Treatment:                                                DATE: 06-16-22 Therapeutic Exercise:  Super set  - 3 rounds  90 second rest Bil shld flexion 7 # DB 1 x 10 Bil shld abduction 7 # DB 1 x 10 Bil shld OH press 7 # DB 1 x 10 Shoulder External Rotation and Scapular Retraction with 1GTB   3 x 10  Shoulder Extension with GTB  3 x 10 Shoulder Diagonal Horizontal Abduction with GTB  3 x 10 reps Shoulder Horizontal Abduction with GTB 3 x 10 90 90 RT shld/elbow ball bounce  on wall  2 min Ball on wall arm straight with 90 degree shld flexion 2 min 15 lb DB overhead with bil UE to shelf overhead x 10 Modality Cold pack after RX exercises to RT shld for 10 min     [facility] Adult PT Treatment:                                                DATE: 06-02-22 Therapeutic Exercise: Supine Deep Neck Flexor  10 x 10 sec hold  towel supporting neck 3 hold Seated Cervical Retraction Protraction AROM 10 reps UT Stretch   2 reps RT and LT 30 hold Levator Scapulae Stretch   2 reps RT and LT 30 sec hold Shoulder External Rotation and Scapular Retraction with RTB   3 x 10  Shoulder Extension with RTB  3 x 10 Shoulder Diagonal Horizontal Abduction with RTB  3 x 10 reps Shoulder Horizontal Abduction with RTB 3 x 10  Manual Therapy: STW to RT UT and LS and paraspinal cervical muscles.  Pt with over stretch in rotation with cavitation.  Trigger Point Dry Needling Treatment: Pre-treatment instruction: Patient instructed on dry needling rationale, procedures, and possible side effects including pain during treatment (achy,cramping feeling), bruising, drop of blood, lightheadedness, nausea, sweating. Patient Consent Given: Yes Education handout provided: Previously provided Muscles treated: RT UT, LS, C4/5 cervical paraspinal  Needle size and number: .30x65mm x 4 Electrical stimulation performed: No Parameters: N/A Treatment response/outcome: Twitch response elicited and Palpable decrease in muscle tension Post-treatment instructions: Patient instructed to expect possible mild to moderate muscle soreness later today and/or tomorrow. Patient instructed in methods to reduce muscle soreness and to continue prescribed HEP. If patient was dry needled over the lung field, patient was instructed on signs and symptoms of pneumothorax and, however unlikely, to see immediate medical attention should they occur. Patient was also educated on signs and symptoms of infection and to seek medical  attention should they occur. Patient verbalized understanding of these instructions and education.  DATE: EVAL and issue of information on TPDN and HEP    PATIENT EDUCATION:  Education details: POC , Explanation of findings , HEP and information on TPDN Person educated: Patient and interpreter Education method: Explanation, Demonstration, Tactile cues, Verbal cues, and Handouts Education comprehension: verbalized understanding, returned demonstration, verbal cues required, tactile cues required, and needs further education   HOME EXERCISE PROGRAM: Access Code: [email or web address] URL: [email or web address] Date: 06/02/2022 Prepared by: Garen Lah  Exercises - Supine Deep Neck Flexor Training  - 2-3 x daily - 7 x weekly - 10 reps - 3 hold - Seated Cervical Retraction Protraction AROM  - 2-3 x daily - 7 x weekly - 1 sets - 10 reps - Seated Gentle Upper Trapezius Stretch  - 1 x daily - 7 x weekly - 1 sets - 3 reps - 30 hold - Gentle Levator Scapulae Stretch  - 1 x daily - 7 x weekly - 3 sets - 3 reps - 30 hold - Shoulder External Rotation and Scapular Retraction with Resistance  - 2 x daily - 7 x weekly - 3 sets - 10 reps - Shoulder Extension with Resistance - Palms Forward  - 1 x daily - 7 x weekly - 3 sets - 10 reps - Standing Shoulder Diagonal Horizontal Abduction 60/120 Degrees with Resistance  - 1 x daily - 7 x weekly - 3 sets - 10 reps - Standing Shoulder Horizontal Abduction with Resistance  - 1 x daily - 7 x weekly - 3 sets - 10 reps   ASSESSMENT:   CLINICAL IMPRESSION: Pt returns with [employer] interpreter and states she has less pain at 2/10 today.  Pt declines TPDN and works on progressive strengthening.  Pt needs min  cuing to keep from elevating RT upper trap.   Will continue to progress toward goal achievement and increased strengthening.  Pt will be ready  for DC next visit.  Progressed to GTB this visit.   EVAL: Patient is a 42 y.o. female who was seen today for physical therapy evaluation and treatment for cervicalgia for past 3 months from sleeping in sustained hyperflexed position for 6 hours and continues to have pain while turning neck to RT and difficulty sleeping and carrying items with increased neck pain.  Pt will benefit from skilled PT to address impairments. 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    OBJECTIVE IMPAIRMENTS: decreased ROM, decreased strength, impaired UE functional use, postural dysfunction, obesity, and pain.    ACTIVITY LIMITATIONS: carrying, lifting, and sleeping   PARTICIPATION LIMITATIONS: driving and carrying grandchildren   PERSONAL FACTORS: 1 comorbidity: HTN  are also affecting patient's functional outcome.    REHAB POTENTIAL: Good   CLINICAL DECISION MAKING: Stable/uncomplicated   EVALUATION COMPLEXITY: Low     GOALS: Goals reviewed with patient? Yes   SHORT TERM GOALS: Target date: May 23, 2022   Independent with initial HEP Baseline: no knowledge Goal status: MET   2.  Pt will decreased pain with driving from 5/40 to 9/81 Baseline: 9/10 turning while driving Status 1/91 Goal status: MET   3.  Demonstrate understanding of proper sitting posture, body mechanics, work ergonomics, and be more conscious of position and posture throughout the day.  Baseline: no knowledge Goal status: INITIAL       LONG TERM GOALS: Target date: 07-14-22   Demonstrate and verbalize techniques to reduce the risk of re-injury including: lifting, posture, body mechanics Baseline: no knowledge Goal status: ONGOING   2.  Pt will  be independent with advanced HEP.  Baseline: no knowledge Goal status: ONGOING   3.  Improved cervical rotation to allow checking blind spots while driving with minimal discomfort Baseline: Pain with driving 4/69 Statusl Pt can drive with no problem,  6/29  pain Goal status: MET   4.  Pt will be able to sleep on RT side without waking in night for sleep Baseline: Pt is unable to sleep at night on RT side due to pain Status 06-16-22  can sleep on RT side Goal status: MET   5.  Pt will decreased NDI from 8/50 to at least 4/10 Baseline: 8/50 Goal status: Ongoing   6.  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 Baseline: Pt unable to carry grandchild due to pain Status  Pt trying to do 15 lb today 06-16-22 Goal status: ongoing     PLAN:   PT FREQUENCY: 1x/week   PT DURATION: 8 weeks   PLANNED INTERVENTIONS: Therapeutic exercises, Therapeutic activity, Neuromuscular re-education, Balance training, Gait training, Patient/Family education, Self Care, Joint mobilization, Dry Needling, Cryotherapy, Moist heat, Taping, Manual therapy, and Re-evaluation   PLAN FOR NEXT SESSION:POC to achieve goals      Garen Lah, PT, [facility] Certified Exercise Expert for the Aging Adult  06/16/22 3:51 PM Phone: [phone] Fax: [phone]   Garen Lah, PT, [facility] Certified Exercise Expert for the Aging Adult  07/23/22 9:18 AM Phone: [phone] Fax: [phone]

## 2022-06-23 ENCOUNTER — Ambulatory Visit: Payer: Self-pay | Attending: Neurology | Admitting: Physical Therapy

## 2022-06-23 ENCOUNTER — Telehealth: Payer: Self-pay | Admitting: Physical Therapy

## 2022-06-23 NOTE — Telephone Encounter (Signed)
Pt contacted through online interpreter.  Pt was contacted to see why she did not show for appt.  Through interpreter, she reported she did not have transportation and her ride did not show 10 minutes of appt.  Pt was reminded of future appt on April 9 at 8:45 am. Pt was also reminded of cancellation policy including 2 No shows would require making appt one at a time.  Pt verbalized understanding through interpreter.  Pt states she will attend her April 9 appt.   Voncille Lo, PT, [place] Certified Exercise Expert for the Aging Adult  06/23/22 4:32 PM Phone: [phone] Fax: [phone]

## 2022-06-23 NOTE — Therapy (Deleted)
OUTPATIENT PHYSICAL THERAPY TREATMENT NOTE   Patient Name: Brooke Lam MRN: [id number] DOB:[birth year], 42 y.o., female Today's Date: 06/23/2022  PCP: Ladell Pier, MD   REFERRING PROVIDER: Pieter Partridge, DO    END OF SESSION:      Past Medical History:  Diagnosis Date   Anemia    Blood transfusion without reported diagnosis    Depression    Ectopic pregnancy    Hyperlipidemia    Hypertension    Preterm labor    Past Surgical History:  Procedure Laterality Date   ABDOMINAL HYSTERECTOMY     BREAST BIOPSY Right 2013   FA   CESAREAN SECTION     C/S x 4   UPPER GASTROINTESTINAL ENDOSCOPY  04/23/2020   Patient Active Problem List   Diagnosis Date Noted   Cervix still in place 06/02/2022   Language barrier 06/01/2022   History of ovarian cyst 06/01/2022   PCB (post coital bleeding) 06/01/2022   Empty sella 04/10/2022   Bilateral carpal tunnel syndrome 04/10/2022   Cephalalgia 04/10/2022   Mixed hyperlipidemia 07/15/2021   Prediabetes 07/15/2021   Fibroadenoma of breast, right 05/11/2019   Dyspareunia 11/12/2014   Dysuria 09/17/2011    REFERRING DIAG: M54.2 (ICD-10-CM) - Cervicalgia    THERAPY DIAG:  No diagnosis found.  Rationale for Evaluation and Treatment Rehabilitation  PERTINENT HISTORY: HTN  PRECAUTIONS: none  SUBJECTIVE:                                                                                                                                                                                      SUBJECTIVE STATEMENT:   with interpreter  2/10. I do not need TPDN today. EVAL - I fell asleep after drinking ETOH which I rarely do. My neck was forced forward for about 6 hours.  I have had pain ever since.  I tried to massage my own neck but nothing helped. I cannot hold my grandbaby or sleep on my RT side.  I can't turn my head to drive   PAIN:  Are you having pain? Yes: NPRS scale: 5/10 at rest and at worst 9/10 Pain location: neck  RT side Pain description: aching Aggravating factors: driving and turning neck, sleeps on stomach with head turned to left to avoid right, not sleeping on the right Relieving factors: massage herself and stretching Works as a Paediatric nurse.  Hard to lift grandchildren  20 lb   OBJECTIVE: (objective measures completed at initial evaluation unless otherwise dated)   DIAGNOSTIC FINDINGS: 04-16-22  COMPARISON:  11/20/2020   FINDINGS: Brain: No evidence of acute infarction, hemorrhage, hydrocephalus, extra-axial collection or mass lesion/mass  effect.   Vascular: No hyperdense vessel or unexpected calcification.   Skull: Normal. Negative for fracture or focal lesion.   Sinuses/Orbits: No acute finding.   IMPRESSION: No acute intracranial process.   PATIENT SURVEYS:  NDI 8/50  16 %   COGNITION: Overall cognitive status: Within functional limits for tasks assessed   SENSATION: WFL   POSTURE: rounded shoulders and forward head   PALPATION: Pt with TTP on RT neck paraspinals and special tenderness over C-4/5         CERVICAL ROM:    Active ROM A/PROM (deg) eval  Flexion 57  Extension 21  Right lateral flexion 24  Left lateral flexion 18  Right rotation 52  Left rotation 32   (Blank rows = not tested)   UPPER EXTREMITY ROM:  Pt WNL all planes of UE   Active ROM Right eval Left eval  Shoulder flexion 168 170  Shoulder extension      Shoulder abduction      Shoulder adduction      Shoulder extension      Shoulder internal rotation      Shoulder external rotation      Elbow flexion      Elbow extension      Wrist flexion      Wrist extension      Wrist ulnar deviation      Wrist radial deviation      Wrist pronation      Wrist supination       (Blank rows = not tested)   UPPER EXTREMITY MMT: WFL  Grossly 4 to 5/5   MMT Right eval Left eval  Shoulder flexion 4 5  Shoulder extension      Shoulder abduction 4 5  Shoulder adduction       Shoulder extension      Shoulder internal rotation 5 5  Shoulder external rotation 4 5  Middle trapezius      Lower trapezius      Elbow flexion      Elbow extension      Wrist flexion      Wrist extension      Wrist ulnar deviation      Wrist radial deviation      Wrist pronation      Wrist supination      Grip strength       (Blank rows = not tested)   CERVICAL SPECIAL TESTS:  Neck flexor muscle endurance test: Pt able to sustain 22 sec (normal 37 sec), Spurling's test: Negative, and Distraction test: Negative   FUNCTIONAL TESTS:  5 times sit to stand: 9:38 sec   TODAY'S TREATMENT:  [facility] Adult PT Treatment:                                                DATE: 06-23-22 Therapeutic Exercise: *** Manual Therapy: *** Neuromuscular re-ed: *** Therapeutic Activity: *** Modalities: *** Self Care: ***    [facility] Adult PT Treatment:                                                DATE: 06-16-22 Therapeutic Exercise:  Super set  - 3 rounds  90 second rest Bil shld flexion 7 #  DB 1 x 10 Bil shld abduction 7 # DB 1 x 10 Bil shld OH press 7 # DB 1 x 10 Shoulder External Rotation and Scapular Retraction with 1GTB   3 x 10  Shoulder Extension with GTB  3 x 10 Shoulder Diagonal Horizontal Abduction with GTB  3 x 10 reps Shoulder Horizontal Abduction with GTB 3 x 10 90 90 RT shld/elbow ball bounce on wall  2 min Ball on wall arm straight with 90 degree shld flexion 2 min 15 lb DB overhead with bil UE to shelf overhead x 10 Modality Cold pack after RX exercises to RT shld for 10 min     [facility] Adult PT Treatment:                                                DATE: 06-02-22 Therapeutic Exercise: Supine Deep Neck Flexor  10 x 10 sec hold  towel supporting neck 3 hold Seated Cervical Retraction Protraction AROM 10 reps UT Stretch   2 reps RT and LT 30 hold Levator Scapulae Stretch   2 reps RT and LT 30 sec hold Shoulder External Rotation and Scapular Retraction with RTB   3 x 10   Shoulder Extension with RTB  3 x 10 Shoulder Diagonal Horizontal Abduction with RTB  3 x 10 reps Shoulder Horizontal Abduction with RTB 3 x 10  Manual Therapy: STW to RT UT and LS and paraspinal cervical muscles.  Pt with over stretch in rotation with cavitation.  Trigger Point Dry Needling Treatment: Pre-treatment instruction: Patient instructed on dry needling rationale, procedures, and possible side effects including pain during treatment (achy,cramping feeling), bruising, drop of blood, lightheadedness, nausea, sweating. Patient Consent Given: Yes Education handout provided: Previously provided Muscles treated: RT UT, LS, C4/5 cervical paraspinal  Needle size and number: .30x3mm x 4 Electrical stimulation performed: No Parameters: N/A Treatment response/outcome: Twitch response elicited and Palpable decrease in muscle tension Post-treatment instructions: Patient instructed to expect possible mild to moderate muscle soreness later today and/or tomorrow. Patient instructed in methods to reduce muscle soreness and to continue prescribed HEP. If patient was dry needled over the lung field, patient was instructed on signs and symptoms of pneumothorax and, however unlikely, to see immediate medical attention should they occur. Patient was also educated on signs and symptoms of infection and to seek medical attention should they occur. Patient verbalized understanding of these instructions and education.                                                                                                                         DATE: EVAL and issue of information on TPDN and HEP    PATIENT EDUCATION:  Education details: POC , Explanation of findings , HEP and information on TPDN Person educated: Patient and interpreter Education method: Explanation, Demonstration, Tactile cues, Verbal  cues, and Handouts Education comprehension: verbalized understanding, returned demonstration, verbal cues required,  tactile cues required, and needs further education   HOME EXERCISE PROGRAM: Access Code: [email or web address] URL: [email or web address] Date: 06/02/2022 Prepared by: Voncille Lo  Exercises - Supine Deep Neck Flexor Training  - 2-3 x daily - 7 x weekly - 10 reps - 3 hold - Seated Cervical Retraction Protraction AROM  - 2-3 x daily - 7 x weekly - 1 sets - 10 reps - Seated Gentle Upper Trapezius Stretch  - 1 x daily - 7 x weekly - 1 sets - 3 reps - 30 hold - Gentle Levator Scapulae Stretch  - 1 x daily - 7 x weekly - 3 sets - 3 reps - 30 hold - Shoulder External Rotation and Scapular Retraction with Resistance  - 2 x daily - 7 x weekly - 3 sets - 10 reps - Shoulder Extension with Resistance - Palms Forward  - 1 x daily - 7 x weekly - 3 sets - 10 reps - Standing Shoulder Diagonal Horizontal Abduction 60/120 Degrees with Resistance  - 1 x daily - 7 x weekly - 3 sets - 10 reps - Standing Shoulder Horizontal Abduction with Resistance  - 1 x daily - 7 x weekly - 3 sets - 10 reps   ASSESSMENT:   CLINICAL IMPRESSION: Pt returns with AM[employer] interpreter and states she has less pain at 2/10 today.  Pt declines TPDN and works on progressive strengthening.  Pt needs min  cuing to keep from elevating RT upper trap.   Will continue to progress toward goal achievement and increased strengthening.  Pt will be ready for DC next visit.  Progressed to GTB this visit.   EVAL: Patient is a 42 y.o. female who was seen today for physical therapy evaluation and treatment for cervicalgia for past 3 months from sleeping in sustained hyperflexed position for 6 hours and continues to have pain while turning neck to RT and difficulty sleeping and carrying items with increased neck pain.  Pt will benefit from skilled PT to address impairments. 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    OBJECTIVE IMPAIRMENTS: decreased ROM, decreased strength, impaired UE functional use, postural dysfunction, obesity,  and pain.    ACTIVITY LIMITATIONS: carrying, lifting, and sleeping   PARTICIPATION LIMITATIONS: driving and carrying grandchildren   PERSONAL FACTORS: 1 comorbidity: HTN  are also affecting patient's functional outcome.    REHAB POTENTIAL: Good   CLINICAL DECISION MAKING: Stable/uncomplicated   EVALUATION COMPLEXITY: Low     GOALS: Goals reviewed with patient? Yes   SHORT TERM GOALS: Target date: May 23, 2022   Independent with initial HEP Baseline: no knowledge Goal status: MET   2.  Pt will decreased pain with driving from [id number] to 5/10 Baseline: 9/10 turning while driving Status [id number] Goal status: MET   3.  Demonstrate understanding of proper sitting posture, body mechanics, work ergonomics, and be more conscious of position and posture throughout the day.  Baseline: no knowledge Goal status: INITIAL       LONG TERM GOALS: Target date: 07-14-22   Demonstrate and verbalize techniques to reduce the risk of re-injury including: lifting, posture, body mechanics Baseline: no knowledge Goal status: ONGOING   2.  Pt will be independent with advanced HEP.  Baseline: no knowledge Goal status: ONGOING   3.  Improved cervical rotation to allow checking blind spots while driving with minimal discomfort Baseline: Pain with driving [id number] Statusl Pt can drive with no  problem,  2/10 pain Goal status: MET   4.  Pt will be able to sleep on RT side without waking in night for sleep Baseline: Pt is unable to sleep at night on RT side due to pain Status 06-16-22  can sleep on RT side Goal status: MET   5.  Pt will decreased NDI from 8/50 to at least 4/10 Baseline: 8/50 Goal status: Ongoing   6.  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 Baseline: Pt unable to carry grandchild due to pain Status  Pt trying to do 15 lb today 06-16-22 Goal status: ongoing     PLAN:   PT  FREQUENCY: 1x/week   PT DURATION: 8 weeks   PLANNED INTERVENTIONS: Therapeutic exercises, Therapeutic activity, Neuromuscular re-education, Balance training, Gait training, Patient/Family education, Self Care, Joint mobilization, Dry Needling, Cryotherapy, Moist heat, Taping, Manual therapy, and Re-evaluation   PLAN FOR NEXT SESSION:POC to achieve goals      ***

## 2022-06-25 ENCOUNTER — Ambulatory Visit: Payer: Self-pay | Admitting: Internal Medicine

## 2022-06-26 ENCOUNTER — Ambulatory Visit: Payer: Self-pay | Attending: Internal Medicine | Admitting: Internal Medicine

## 2022-06-26 ENCOUNTER — Encounter: Payer: Self-pay | Admitting: Internal Medicine

## 2022-06-26 ENCOUNTER — Other Ambulatory Visit: Payer: Self-pay

## 2022-06-26 DIAGNOSIS — G5603 Carpal tunnel syndrome, bilateral upper limbs: Secondary | ICD-10-CM

## 2022-06-26 DIAGNOSIS — R200 Anesthesia of skin: Secondary | ICD-10-CM

## 2022-06-26 DIAGNOSIS — R7303 Prediabetes: Secondary | ICD-10-CM

## 2022-06-26 DIAGNOSIS — R202 Paresthesia of skin: Secondary | ICD-10-CM

## 2022-06-26 NOTE — Progress Notes (Signed)
Patient ID: Brooke Lam, female   DOB: February 21, 1981, 42 y.o.   MRN: [id number] Virtual Visit via Telephone Note  I connected with Brooke Lam on 06/26/2022 at 8:10 AM by telephone and verified that I am speaking with the correct person using two identifiers  Location: Patient: home Provider: office  Participants: Myself [name]: [phone] Adline Mango   I discussed the limitations, risks, security and privacy concerns of performing an evaluation and management service by telephone and the availability of in person appointments. I also discussed with the patient that there may be a patient responsible charge related to this service. The patient expressed understanding and agreed to proceed.   History of Present Illness: Pt with hx of hyperTG, PreDM, abdominal pannus, palpitations (normal echo and Holter.  Declined nightly propranolol by cardiology), right ovarian lesion (followed by GYN Dr. Clearance Coots 3/23 and 01/13/2022.  Endometrioma versus complicated hemorrhagic cyst.  Plan for follow-up ultrasound in 1 year), chronic left upper quadrant pain (followed by GI Dr. Orvan Falconer), meralgia paresthetica LL limb.  Pt was seen 04/10/22.   04/10/22 visit concerns:  Patient presents with concern with numbness in both hands x 4 months. Intermittently and more so at nights. Mainly 2-5th fingers. Wakes her up. 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 Pt dx with possible CTS.  Given one cock-up wrist splint and told to purchase the other OTC  Today:  pt reports she still has feeling of hands being swollen and numbness during the nights.  No tingling.  Did get the other wrist splints and sleeps with them on.  Not helping.  Takes them off around 3 a.m because she can not stand to wear them throughout the night.  Wants to know if she needs study to check circulation   Wants to go over results of blood test done on last visit with  me. Observations/Objective: No direct observation done as this was a telephone visit.  A1c was in the range for prediabetes.  Cholesterol levels were good.  Assessment and Plan: No direct observation done as this was a telephone visit. Lab Results  Component Value Date   HGBA1C 6.3 (H) 04/10/2022   Lab Results  Component Value Date   CHOL 144 04/10/2022   HDL 36 (L) 04/10/2022   LDLCALC 86 04/10/2022   TRIG 124 04/10/2022   CHOLHDL 4.0 04/10/2022   Lab Results  Component Value Date   WBC 8.0 05/20/2022   HGB 12.3 05/20/2022   HCT 37.0 05/20/2022   MCV 81.5 05/20/2022   PLT 268 05/20/2022     Chemistry      Component Value Date/Time   NA 135 05/20/2022 1637   NA 139 05/23/2021 0917   K 3.4 (L) 05/20/2022 1637   CL 100 05/20/2022 1637   CO2 25 05/20/2022 1637   BUN 17 05/20/2022 1637   BUN 14 05/23/2021 0917   CREATININE 0.84 05/20/2022 1637   CREATININE 0.71 07/06/2014 1153      Component Value Date/Time   CALCIUM 9.1 05/20/2022 1637   ALKPHOS 59 05/20/2022 1637   AST 28 05/20/2022 1637   ALT 13 05/20/2022 1637   BILITOT 0.7 05/20/2022 1637   BILITOT 0.3 04/10/2022 0947       Follow Up Instructions: 1. Bilateral carpal tunnel syndrome Given that symptoms have persisted despite conservative measures, will refer her to neurology for nerve conduction study - Ambulatory referral to Neurology  2. Numbness in both hands See #1  above - Ambulatory referral to Neurology  3. Prediabetes Discussed the importance of healthy eating habits and regular exercise to prevent progression to full diabetes.    I discussed the assessment and treatment plan with the patient. The patient was provided an opportunity to ask questions and all were answered. The patient agreed with the plan and demonstrated an understanding of the instructions.   The patient was advised to call back or seek an in-person evaluation if the symptoms worsen or if the condition fails to improve as  anticipated.  I  Spent 15 minutes on this telephone encounter  This note has been created with Education officer, [employer]Dragon speech recognition software and smart phrase technology. Any transcriptional errors are unintentional.  Jonah Blueeborah Jerron Niblack, MD

## 2022-06-29 ENCOUNTER — Ambulatory Visit (INDEPENDENT_AMBULATORY_CARE_PROVIDER_SITE_OTHER): Payer: Self-pay | Admitting: Neurology

## 2022-06-29 DIAGNOSIS — G5601 Carpal tunnel syndrome, right upper limb: Secondary | ICD-10-CM

## 2022-06-29 DIAGNOSIS — R202 Paresthesia of skin: Secondary | ICD-10-CM

## 2022-06-29 NOTE — Procedures (Signed)
[facility]  [address] [place], [address]  [place], [place] [address] Tel: [phone] Fax: [phone] Test Date:  06/29/2022  Patient: Brooke Lam DOB: 1980/06/05 Physician: Jacquelyne Balint, MD  Sex: Female Height: 5\' 3"  Ref Phys: Jonah Blue, MD  ID#: [id number]   Technician:    History: This is a 42 year old female with numbness in her hands.  NCV & EMG Findings: Extensive electrodiagnostic evaluation of the right and left upper limbs shows: Right median sensory response shows prolonged distal peak latency (3.5 ms). Left median, left median-ulnar palmar, bilateral ulnar, and bilateral radian are within normal limits. Bilateral median (APB) and bilateral ulnar (ADM) motor responses are within normal limits. There is no evidence of active or chronic motor axon loss changes affecting any of the tested muscles on needle examination. Motor unit configuration and recruitment pattern is within normal limits.  Impression: This is an abnormal study. 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 No electrodiagnostic evidence of a left median mononeuropathy at or distal to the wrist, consistent with carpal tunnel syndrome. No electrodiagnostic evidence of a right or left cervical (C5-C8) motor radiculopathy. Screening studies for right or left ulnar or radial mononeuropathy are normal.   ___________________________ Jacquelyne Balint, MD    Nerve Conduction Studies Motor Nerve Results    Latency Amplitude F-Lat Segment Distance CV Comment  Site (ms) Norm (mV) Norm (ms)  (cm) (m/s) Norm   Left Median (APB) Motor  Wrist 1.88  < 3.9 7.7  > 6.0        Elbow 6.3 - 7.5 -  Elbow-Wrist 28 64  > 50   Right Median (APB) Motor  Wrist 2.6  < 3.9 6.1  > 6.0        Elbow 7.0 - 5.3 -  Elbow-Wrist 27 61  > 50   Left Ulnar (ADM) Motor  Wrist 1.48  <  3.1 8.6  > 7.0        Bel elbow 4.6 - 8.3 -  Bel elbow-Wrist 20 65  > 50   Ab elbow 6.3 - 8.0 -  Ab elbow-Bel elbow 10 59 -   Right Ulnar (ADM) Motor  Wrist 1.78  < 3.1 7.3  > 7.0        Bel elbow 5.1 - 7.0 -  Bel elbow-Wrist 21.5 65  > 50   Ab elbow 6.8 - 6.7 -  Ab elbow-Bel elbow 10 59 -    Sensory Sites    Neg Peak Lat Amplitude (O-P) Segment Distance Velocity Comment  Site (ms) Norm (V) Norm  (cm) (ms)   Left Median Sensory  Wrist-Dig II 2.8  < 3.4 39  > 20 Wrist-Dig II 13    Right Median Sensory  Wrist-Dig II *3.5  < 3.4 21  > 20 Wrist-Dig II 13    Left Median-Ulnar Palmar Sensory       Median  Palm-Wrist 1.93  < 2.2 58  > 10 Palm-Wrist 8         Ulnar  Palm-Wrist 1.58  < 2.2 22  > 5 Palm-Wrist 8    Left Radial Sensory  Forearm-Wrist 1.83  < 2.7 27  > 18 Forearm-Wrist 10    Right Radial Sensory  Forearm-Wrist 2.0  < 2.7 26  > 18 Forearm-Wrist 10    Left Ulnar Sensory  Wrist-Dig V 2.1  < 3.1  16  > 12 Wrist-Dig V 11    Right Ulnar Sensory  Wrist-Dig V 2.4  < 3.1 19  > 12 Wrist-Dig V 11     Inter-Nerve Comparisons   Nerve 1 Value 1 Nerve 2 Value 2 Parameter Result Normal  Sensory Sites  L Median Palm-Wrist 1.93 ms L Ulnar Palm-Wrist 1.58 ms Peak Lat Diff 0.35 ms <0.40   Electromyography   Side Muscle Ins.Act Fibs Fasc Recrt Amp Dur Poly Activation Comment  Left FDI Nml Nml Nml Nml Nml Nml Nml Nml N/A  Left EIP Nml Nml Nml Nml Nml Nml Nml Nml N/A  Left FPL Nml Nml Nml Nml Nml Nml Nml Nml N/A  Left Pronator teres Nml Nml Nml Nml Nml Nml Nml Nml N/A  Left Biceps Nml Nml Nml Nml Nml Nml Nml Nml N/A  Left Triceps Nml Nml Nml Nml Nml Nml Nml Nml N/A  Left Deltoid Nml Nml Nml Nml Nml Nml Nml Nml N/A  Right FDI Nml Nml Nml Nml Nml Nml Nml Nml N/A  Right EIP Nml Nml Nml Nml Nml Nml Nml Nml N/A  Right APB Nml Nml Nml Nml Nml Nml Nml Nml N/A  Right Pronator teres Nml Nml Nml Nml Nml Nml Nml Nml N/A  Right Biceps Nml Nml Nml Nml Nml Nml Nml Nml N/A  Right Triceps Nml Nml Nml  Nml Nml Nml Nml Nml N/A  Right Deltoid Nml Nml Nml Nml Nml Nml Nml Nml N/A      Waveforms:  Motor           Sensory

## 2022-06-30 ENCOUNTER — Ambulatory Visit: Payer: Self-pay | Admitting: Physical Therapy

## 2022-07-01 ENCOUNTER — Other Ambulatory Visit: Payer: Self-pay | Admitting: Internal Medicine

## 2022-07-01 DIAGNOSIS — G5601 Carpal tunnel syndrome, right upper limb: Secondary | ICD-10-CM

## 2022-07-02 ENCOUNTER — Ambulatory Visit
Admission: RE | Admit: 2022-07-02 | Discharge: 2022-07-02 | Disposition: A | Discharge status: Home / Self Care | Payer: No Typology Code available for payment source | Source: Ambulatory Visit | Attending: Obstetrics and Gynecology | Admitting: Obstetrics and Gynecology

## 2022-07-02 ENCOUNTER — Ambulatory Visit: Payer: Self-pay | Admitting: Hematology and Oncology

## 2022-07-02 ENCOUNTER — Ambulatory Visit (INDEPENDENT_AMBULATORY_CARE_PROVIDER_SITE_OTHER): Payer: Self-pay

## 2022-07-02 ENCOUNTER — Other Ambulatory Visit: Payer: Self-pay

## 2022-07-02 VITALS — BP 121/84 | HR 79 | Ht 63.0 in | Wt 165.7 lb

## 2022-07-02 VITALS — BP 131/93 | Wt 165.0 lb

## 2022-07-02 DIAGNOSIS — Z1231 Encounter for screening mammogram for malignant neoplasm of breast: Secondary | ICD-10-CM

## 2022-07-02 DIAGNOSIS — Z0130 Encounter for examination of blood pressure without abnormal findings: Secondary | ICD-10-CM

## 2022-07-02 NOTE — Progress Notes (Signed)
Pt here today for BP check.  With Spanish Interpreter Eda R., pt denies headache and blurry vision.  BP check 121/84.  Pt advised that I will send a message to her PCP as she was mistakenly scheduled here at [facility].  Pt verbalized understanding.   Addison Naegeli, RN

## 2022-07-02 NOTE — Patient Instructions (Signed)
Taught Brooke Lam about self breast awareness and gave educational materials to take home. Patient did not need a Pap smear today due to last Pap smear was in 2022 per patient.  Let her know [facility] will cover Pap smears every 5 years unless has a history of abnormal Pap smears. Referred patient to [facility] for screening mammogram. Appointment scheduled for 07/02/22. Patient aware of appointment and will be there. Let patient know will follow up with her within the next couple weeks with results. Brooke Lam verbalized understanding.  Pascal Lux, NP 1:17 PM

## 2022-07-02 NOTE — Progress Notes (Signed)
[name] is a 42 y.o. female who presents to [facility] clinic today with no complaints.    Pap Smear: Pap not smear completed today. Last Pap smear was 2022 and was normal. Per patient has no history of an abnormal Pap smear. Last Pap smear result is available in Epic.   Physical exam: Breasts Breasts symmetrical. No skin abnormalities bilateral breasts. No nipple retraction bilateral breasts. No nipple discharge bilateral breasts. No lymphadenopathy. No lumps palpated bilateral breasts.  MS DIGITAL SCREENING TOMO BILATERAL  Result Date: 06/20/2021 CLINICAL DATA:  Screening. EXAM: DIGITAL SCREENING BILATERAL MAMMOGRAM WITH TOMOSYNTHESIS AND CAD TECHNIQUE: Bilateral screening digital craniocaudal and mediolateral oblique mammograms were obtained. Bilateral screening digital breast tomosynthesis was performed. The images were evaluated with computer-aided detection. COMPARISON:  Previous exam(s). ACR Breast Density Category b: There are scattered areas of fibroglandular density. FINDINGS: There are no findings suspicious for malignancy. IMPRESSION: No mammographic evidence of malignancy. A result letter of this screening mammogram will be mailed directly to the patient. RECOMMENDATION: Screening mammogram in one year. (Code:SM-B-01Y) BI-RADS CATEGORY  1: Negative. Electronically Signed   By: Bary Richard M.D.   On: 06/20/2021 14:44   MS DIGITAL SCREENING TOMO BILATERAL  Result Date: 05/21/2020 CLINICAL DATA:  Screening. EXAM: DIGITAL SCREENING BILATERAL MAMMOGRAM WITH TOMOSYNTHESIS AND CAD TECHNIQUE: Bilateral screening digital craniocaudal and mediolateral oblique mammograms were obtained. Bilateral screening digital breast tomosynthesis was performed. The images were evaluated with computer-aided detection. COMPARISON:  Previous exam(s). ACR Breast Density Category b: There are scattered areas of fibroglandular density. FINDINGS: There are no findings suspicious for malignancy. The images  were evaluated with computer-aided detection. IMPRESSION: No mammographic evidence of malignancy. A result letter of this screening mammogram will be mailed directly to the patient. RECOMMENDATION: Screening mammogram in one year. (Code:SM-B-01Y) BI-RADS CATEGORY  1: Negative. Electronically Signed   By: Baird Lyons M.D.   On: 05/21/2020 07:17   MS DIGITAL DIAG TOMO BILAT  Result Date: 05/10/2019 CLINICAL DATA:  Patient complains of a palpable right breast mass. 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 EXAM: DIGITAL DIAGNOSTIC BILATERAL MAMMOGRAM WITH CAD AND TOMO ULTRASOUND RIGHT BREAST COMPARISON:  Prior ultrasounds dated 12/09/2011 and 12/16/2011. ACR Breast Density Category b: There are scattered areas of fibroglandular density. FINDINGS: There is a lobulated 2.2 cm mass in the medial aspect of the right breast with a ribbon shaped clip. No additional mass is seen in the right breast. There is no mass seen in the left breast. There are no malignant type microcalcifications in either breast. Mammographic images were processed with CAD. Targeted ultrasound is performed, showing a hypoechoic mass in the right breast at 3 o'clock 2 cm from the nipple measuring 2.2 x 0.7 x 1.1 cm. On the prior exam dated 12/09/2011 it measured 2.2 x 0.8 x 1.5 cm. 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 No evidence of malignancy in either breast. RECOMMENDATION: Bilateral screening mammogram in 1 year is recommended. I have discussed the findings and recommendations with the patient. If applicable, a reminder letter will be sent to the patient regarding the next appointment. BI-RADS CATEGORY  2: Benign. Electronically Signed   By: Baird Lyons M.D.   On: 05/10/2019 14:24         Pelvic/Bimanual Pap is not indicated today    Smoking History: Patient has never smoked and was not referred to quit line.  Patient Navigation: Patient education provided. Access to services provided for patient through [facility] program. Alis interpreter provided. No transportation provided   Colorectal Cancer Screening: Per patient has never had colonoscopy completed No complaints today.    Breast and Cervical Cancer Risk Assessment: Patient has family history of breast cancer with her paternal aunt. Patient does not have history of cervical dysplasia, immunocompromised, or DES exposure in-utero.  Risk Assessment   No risk assessment data for the current encounter  Risk Scores       06/19/2021   Last edited by: Narda Rutherford, LPN   5-year risk: 0.3 %   Lifetime risk: 4.6 %            A: BCCCP exam without pap smear No complaints with benign exam.   P: Referred patient to [facility] for a screening mammogram. Appointment scheduled 07/02/22.  Pascal Lux, NP 07/02/2022 12:42 PM

## 2022-07-06 ENCOUNTER — Ambulatory Visit (HOSPITAL_COMMUNITY)
Admission: RE | Admit: 2022-07-06 | Discharge: 2022-07-06 | Disposition: A | Discharge status: Home / Self Care | Payer: No Typology Code available for payment source | Source: Ambulatory Visit | Attending: Obstetrics and Gynecology | Admitting: Obstetrics and Gynecology

## 2022-07-06 DIAGNOSIS — R109 Unspecified abdominal pain: Secondary | ICD-10-CM | POA: Insufficient documentation

## 2022-07-06 DIAGNOSIS — Z8742 Personal history of other diseases of the female genital tract: Secondary | ICD-10-CM

## 2022-07-07 ENCOUNTER — Ambulatory Visit: Payer: Self-pay | Admitting: Physical Therapy

## 2022-07-07 ENCOUNTER — Telehealth: Payer: Self-pay

## 2022-07-07 NOTE — Telephone Encounter (Addendum)
-----   Message from [place] Bing, MD sent at 07/07/2022  8:07 AM EDT ----- Please let her know that her right ovarian cyst is stable and please set her up with an in person visit with me, anytime in the next few weeks, to go over findings and next steps. Thanks  Pt informed results with [employer].  Pt agreed to appt scheduled on 08/03/22 @ 1355 to discuss with provider about management.  Pt verbalized understanding.    Leonette Nutting

## 2022-07-13 ENCOUNTER — Other Ambulatory Visit: Payer: Self-pay

## 2022-07-13 ENCOUNTER — Ambulatory Visit (INDEPENDENT_AMBULATORY_CARE_PROVIDER_SITE_OTHER): Payer: Self-pay | Admitting: Orthopedic Surgery

## 2022-07-13 DIAGNOSIS — G5601 Carpal tunnel syndrome, right upper limb: Secondary | ICD-10-CM

## 2022-07-13 DIAGNOSIS — M25531 Pain in right wrist: Secondary | ICD-10-CM

## 2022-07-13 MED ORDER — MELOXICAM 15 MG PO TABS
ORAL_TABLET | ORAL | 0 refills | Status: DC
Start: 2022-07-13 — End: 2023-02-24

## 2022-07-14 ENCOUNTER — Encounter: Payer: Self-pay | Admitting: Orthopedic Surgery

## 2022-07-14 ENCOUNTER — Ambulatory Visit: Payer: Self-pay | Admitting: Physical Therapy

## 2022-07-14 NOTE — Progress Notes (Signed)
Office Visit Note   Patient: Brooke Lam           Date of Birth: 1981-03-22           MRN: [id number] Visit Date: 07/13/2022 Requested by: Marcine Matar, MD [address] [place],  [place] [address] PCP: Marcine Matar, MD  Subjective: Chief Complaint  Patient presents with   Other     Bilateral wrist pain with numbness-Right worse than left Eval for possible CTS    HPI: Brooke Lam is a 42 y.o. female who presents to the office reporting bilateral wrist and hand pain.  Reports some numbness and tingling in the hand as well.  The pain wakes her from sleep at night.  She is right-hand dominant.  She has had 8 months of symptoms.  Occurs every night.  She cleans houses.  During the daytime she generally can manage well but at nighttime becomes more of a problem.  She does wear night splints which does not help.  Has not taken much in the way of medication.  She does have right mild carpal tunnel syndrome by EMG nerve study..                ROS: All systems reviewed are negative as they relate to the chief complaint within the history of present illness.  Patient denies fevers or chills.  Assessment & Plan: Visit Diagnoses:  1. Pain in right wrist     Plan: Impression is mild carpal tunnel syndrome by EMG nerve study but seems a little bit more in the moderate to severe range by clinical symptoms.  Ultrasound-guided injection performed today into the carpal canal on the right-hand side.  Mobic also for 2 weeks.  She should come back in 6 weeks with decision for or against surgery at that time.  Follow-Up Instructions: No follow-ups on file.   Orders:  Orders Placed This Encounter  Procedures   US Guided Needle Placement - No Linked Charges   Meds ordered this encounter  Medications   meloxicam (MOBIC) 15 MG tablet    Sig: 1 po q d x 2 wks then prn    Dispense:  30 tablet    Refill:  0      Procedures: Medium Joint Inj: R  radiocarpal on 07/13/2022 7:33 AM Indications: pain and diagnostic evaluation Details: 22 G 1.5 in needle, ultrasound-guided volar approach Medications: 3 mL lidocaine 1 %; 13.33 mg methylPREDNISolone acetate 40 MG/ML; 0.66 mL bupivacaine 0.25 % Outcome: tolerated well, no immediate complications Procedure, treatment alternatives, risks and benefits explained, specific risks discussed. Consent was given by the patient. Immediately prior to procedure a time out was called to verify the correct patient, procedure, equipment, support staff and site/side marked as required. Patient was prepped and draped in the usual sterile fashion.    Right carpal tunnel injected.   Clinical Data: No additional findings.  Objective: Vital Signs: LMP 10/08/2011   Physical Exam:  Constitutional: Patient appears well-developed HEENT:  Head: Normocephalic Eyes:EOM are normal Neck: Normal range of motion Cardiovascular: Normal rate Pulmonary/chest: Effort normal Neurologic: Patient is alert Skin: Skin is warm Psychiatric: Patient has normal mood and affect  Ortho Exam: Ortho exam demonstrates positive carpal tunnel compression testing on the right.  Abductor pollicis brevis strength is intact.  EPL FPL interosseous strength also intact.  Negative Tinel's cubital tunnel in the elbow.  Wrist range of motion is full.  No snuffbox tenderness and no  tenderness on the radial styloid.  Specialty Comments:  No specialty comments available.  Imaging: US Guided Needle Placement - No Linked Charges  Result Date: 07/14/2022 Ultrasound imaging demonstrates needle placement within the carpal canal with extravasation of fluid and no complicating features.    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   Dyspareunia 11/12/2014   Dysuria 09/17/2011   Past Medical History:  Diagnosis Date   Anemia    Blood transfusion without reported diagnosis    Depression    Ectopic pregnancy    Hyperlipidemia    Hypertension    Preterm labor     Family History  Problem Relation Age of Onset   Diabetes Mother    Hypertension Mother    Diabetes Father    Hypertension Father    Diabetes Paternal Aunt    Breast cancer Paternal Aunt    Diabetes Paternal Grandmother    Anesthesia problems Neg Hx    Hearing loss Neg Hx    Other Neg Hx    Stomach cancer Neg Hx    Rectal cancer Neg Hx    Esophageal cancer Neg Hx    Colon cancer Neg Hx     Past Surgical History:  Procedure Laterality Date   ABDOMINAL HYSTERECTOMY     BREAST BIOPSY Right 2013   FA   CESAREAN SECTION     C/S x 4   UPPER GASTROINTESTINAL ENDOSCOPY  04/23/2020   Social History   Occupational History   Occupation: bartender  Tobacco Use   Smoking status: Former    Types: Cigarettes    Quit date: 2005    Years since quitting: 19.3   Smokeless tobacco: Never  Vaping Use   Vaping Use: Never used  Substance and Sexual Activity   Alcohol use: Not Currently    Comment: every 8 days 5-8 drinks of wine and whiskey   Drug use: No   Sexual activity: Yes    Birth control/protection: None, Surgical

## 2022-07-17 MED ORDER — METHYLPREDNISOLONE ACETATE 40 MG/ML IJ SUSP
13.3300 mg | INTRAMUSCULAR | Status: AC | PRN
Start: 2022-07-13 — End: 2022-07-13
  Administered 2022-07-13: 13.33 mg via INTRA_ARTICULAR

## 2022-07-17 MED ORDER — LIDOCAINE HCL 1 % IJ SOLN
3.0000 mL | INTRAMUSCULAR | Status: AC | PRN
Start: 2022-07-13 — End: 2022-07-13
  Administered 2022-07-13: 3 mL

## 2022-07-17 MED ORDER — BUPIVACAINE HCL 0.25 % IJ SOLN
0.6600 mL | INTRAMUSCULAR | Status: AC | PRN
Start: 2022-07-13 — End: 2022-07-13
  Administered 2022-07-13: .66 mL via INTRA_ARTICULAR

## 2022-07-20 ENCOUNTER — Other Ambulatory Visit: Payer: Self-pay

## 2022-07-20 ENCOUNTER — Ambulatory Visit (INDEPENDENT_AMBULATORY_CARE_PROVIDER_SITE_OTHER): Payer: Self-pay | Admitting: Physician Assistant

## 2022-07-20 ENCOUNTER — Encounter: Payer: Self-pay | Admitting: Physician Assistant

## 2022-07-20 VITALS — BP 110/70 | HR 80 | Ht 62.0 in | Wt 165.5 lb

## 2022-07-20 DIAGNOSIS — R932 Abnormal findings on diagnostic imaging of liver and biliary tract: Secondary | ICD-10-CM

## 2022-07-20 DIAGNOSIS — K649 Unspecified hemorrhoids: Secondary | ICD-10-CM

## 2022-07-20 DIAGNOSIS — R1012 Left upper quadrant pain: Secondary | ICD-10-CM

## 2022-07-20 MED ORDER — HYDROCORTISONE (PERIANAL) 2.5 % EX CREA
1.0000 | TOPICAL_CREAM | Freq: Two times a day (BID) | CUTANEOUS | 1 refills | Status: DC | PRN
  Filled 2022-07-20: qty 30, 15d supply, fill #0
  Filled 2022-10-20: qty 30, 10d supply, fill #0

## 2022-07-20 NOTE — Progress Notes (Signed)
Chief Complaint: Follow-up after colonoscopy  HPI:    Brooke Lam is a 42 year old Hispanic speaking female with a past medical history as listed below, previously assigned to Dr. Orvan Falconer, who returns to clinic after recent colonoscopy for follow-up.    05/27/2022 office visit with Dr. Orvan Falconer for abdominal pain.  At that time described left upper quadrant pain with nausea and bloating as well as abnormal liver on the CT, gastritis and duodenitis on EGD in 2022, stool burden on recent CT, ovarian lesion.  At that time continued on Pantoprazole 40 twice daily and Dicyclomine 20 4 times daily.  Given a trial Of Amitiza 24 twice daily instead of Linzess due to cost.  Scheduled for colonoscopy with a 2-day bowel prep and MRI of the liver to follow-up abnormal CT in 3 to 4 months.  Referred to GYN for ovarian cyst.    06/09/2022 colonoscopy was normal except for external hemorrhoids.  Repeat recommended in 10 years.    Today, patient presents to clinic accompanied by interpreter and tells me that she is doing fairly well on her current medications including Pantoprazole 40 twice daily and Dicyclomine 20 twice daily as well as Amitiza 24 twice daily.  Occasionally she will still get left upper quadrant pain, the last time this happened was about 15 days ago.  She felt like if she rubbed on this area and was able to burp or pass gas it helped it to go away as well as drinking some tea.  Seemed to be worse when she was eating unhealthy greasy foods which she is trying to eliminate.  She just wants to make sure this is normal and nothing to worry about.    Also describes feeling some hemorrhoids since time of her colonoscopy, apparently has battled these off-and-on since she had her baby years ago.  Sometimes they are itchy but otherwise do not bother her much.    Denies fever, chills, weight loss, blood in her stool, nausea or vomiting.  Past Medical History:  Diagnosis Date   Anemia    Blood  transfusion without reported diagnosis    Depression    Ectopic pregnancy    Hyperlipidemia    Hypertension    Preterm labor     Past Surgical History:  Procedure Laterality Date   ABDOMINAL HYSTERECTOMY     BREAST BIOPSY Right 2013   FA   CESAREAN SECTION     C/S x 4   UPPER GASTROINTESTINAL ENDOSCOPY  04/23/2020    Current Outpatient Medications  Medication Sig Dispense Refill   lubiprostone (AMITIZA) 24 MCG capsule Take 1 capsule (24 mcg total) by mouth 2 (two) times daily with a meal. 60 capsule 3   meloxicam (MOBIC) 15 MG tablet 1 po q d x 2 wks then prn 30 tablet 0   norethindrone (MICRONOR) 0.35 MG tablet Take 1 tablet (0.35 mg total) by mouth daily. (Patient not taking: Reported on 07/02/2022) 120 tablet 0   prednisoLONE acetate (PRED FORTE) 1 % ophthalmic suspension SMARTSIG:1 Drop(s) Right Eye Twice Daily PRN     SUMAtriptan (IMITREX) 50 MG tablet Take 1 tablet by mouth at start of headache.  May repeat in 2 hours if no relief.  Max 2 tabs/24 hr. (Patient taking differently: Take 1 tablet by mouth at start of headache.  May repeat in 2 hours if no relief.  Max 2 tabs/24 hr.) 10 tablet 0   No current facility-administered medications for this visit.    Allergies as  of 07/20/2022   (No Known Allergies)    Family History  Problem Relation Age of Onset   Diabetes Mother    Hypertension Mother    Diabetes Father    Hypertension Father    Diabetes Paternal Aunt    Breast cancer Paternal Aunt    Diabetes Paternal Grandmother    Anesthesia problems Neg Hx    Hearing loss Neg Hx    Other Neg Hx    Stomach cancer Neg Hx    Rectal cancer Neg Hx    Esophageal cancer Neg Hx    Colon cancer Neg Hx     Social History   Socioeconomic History   Marital status: Married    Spouse name: Not on file   Number of children: 4   Years of education: Not on file   Highest education level: High school graduate  Occupational History   Occupation: bartender  Tobacco Use    Smoking status: Former    Types: Cigarettes    Quit date: 2005    Years since quitting: 19.3   Smokeless tobacco: Never  Vaping Use   Vaping Use: Never used  Substance and Sexual Activity   Alcohol use: Not Currently    Comment: every 8 days 5-8 drinks of wine and whiskey   Drug use: No   Sexual activity: Yes    Birth control/protection: None, Surgical  Other Topics Concern   Not on file  Social History Narrative   Right Handed    Drinks no caffeine    Social Determinants of Health   Financial Resource Strain: Not on file  Food Insecurity: No Food Insecurity (07/02/2022)   Hunger Vital Sign    Worried About Running Out of Food in the Last Year: Never true    Ran Out of Food in the Last Year: Never true  Transportation Needs: No Transportation Needs (07/02/2022)   PRAPARE - Administrator, Civil Service (Medical): No    Lack of Transportation (Non-Medical): No  Physical Activity: Not on file  Stress: Not on file  Social Connections: Not on file  Intimate Partner Violence: Not on file    Review of Systems:    Constitutional: No weight loss, fever or chills Cardiovascular: No chest pain Respiratory: No SOB  Gastrointestinal: See HPI and otherwise negative   Physical Exam:  Vital signs: BP 110/70 (BP Location: Left Arm, Patient Position: Sitting, Cuff Size: Normal)   Pulse 80   Ht 5\' 2"  (1.575 m)   Wt 165 lb 8 oz (75.1 kg)   LMP 10/08/2011   BMI 30.27 kg/m    Constitutional:   Pleasant Hispanic female appears to be in NAD, Well developed, Well nourished, alert and cooperative Respiratory: Respirations even and unlabored. Lungs clear to auscultation bilaterally.   No wheezes, crackles, or rhonchi.  Cardiovascular: Normal S1, S2. No MRG. Regular rate and rhythm. No peripheral edema, cyanosis or pallor.  Gastrointestinal:  Soft, nondistended, nontender. No rebound or guarding. Normal bowel sounds. No appreciable masses or hepatomegaly. Rectal:  Not performed.   Psychiatric:  Demonstrates good judgement and reason without abnormal affect or behaviors.  RELEVANT LABS AND IMAGING: CBC    Component Value Date/Time   WBC 8.0 05/20/2022 1637   RBC 4.54 05/20/2022 1637   HGB 12.3 05/20/2022 1637   HGB 12.4 12/04/2021 1442   HCT 37.0 05/20/2022 1637   HCT 36.9 12/04/2021 1442   PLT 268 05/20/2022 1637   PLT 289 12/04/2021 1442   MCV  81.5 05/20/2022 1637   MCV 81 12/04/2021 1442   MCH 27.1 05/20/2022 1637   MCHC 33.2 05/20/2022 1637   RDW 13.2 05/20/2022 1637   RDW 14.3 12/04/2021 1442   LYMPHSABS 2.4 12/04/2021 1442   MONOABS 0.4 07/28/2021 1711   EOSABS 0.1 12/04/2021 1442   BASOSABS 0.0 12/04/2021 1442    CMP     Component Value Date/Time   NA 135 05/20/2022 1637   NA 139 05/23/2021 0917   K 3.4 (L) 05/20/2022 1637   CL 100 05/20/2022 1637   CO2 25 05/20/2022 1637   GLUCOSE 100 (H) 05/20/2022 1637   BUN 17 05/20/2022 1637   BUN 14 05/23/2021 0917   CREATININE 0.84 05/20/2022 1637   CREATININE 0.71 07/06/2014 1153   CALCIUM 9.1 05/20/2022 1637   PROT 8.1 05/20/2022 1637   PROT 7.9 04/10/2022 0947   ALBUMIN 4.5 05/20/2022 1637   ALBUMIN 5.1 (H) 04/10/2022 0947   AST 28 05/20/2022 1637   ALT 13 05/20/2022 1637   ALKPHOS 59 05/20/2022 1637   BILITOT 0.7 05/20/2022 1637   BILITOT 0.3 04/10/2022 0947   GFRNONAA >60 05/20/2022 1637   GFRNONAA >89 07/06/2014 1153   GFRAA >60 09/20/2019 1602   GFRAA >89 07/06/2014 1153    Assessment: 1.  Chronic left upper quadrant pain: Currently on Pantoprazole 40 twice daily and Dicyclomine 20 4 times daily which are helpful as well as Amitiza, pain is decreased in frequency, does seem better if she passes gas; likely IBS +/- gastritis +/- splenic flexure syndrome 2.  Hemorrhoids: Occasional irritation, seen at time of colonoscopy 3.  Abnormal CT of the liver: MRI recommended in the next 2 to 3 months for follow-up  Plan: 1.  Reviewed recent colonoscopy.  Patient is doing better on her  current medications.  Will continue them for now including Pantoprazole 40 twice daily and Dicyclomine 20 4 times daily 2.  Discussed left upper quadrant pain, it sounds like splenic flexure syndrome, releasing gas helps this.  Explained there are no red flags and nothing she needs to worry about as long as it goes away and does not occur very often. 3.  Discussed hemorrhoids.  Prescribed Hydrocortisone cream 2.5% to be applied twice daily for 7 to 14 days at a time as needed. 4.  Patient will be scheduled for MRI for further eval of abnormal liver CT in a couple of months per recommendations from Dr. Orvan Falconer. 5.  Patient to follow in clinic with [place] as needed or directed by imaging above.  Due to Dr. Orvan Falconer resignation patient was assigned to Dr. Chales Abrahams today.  Hyacinth Meeker, PA-C [facility] 07/20/2022, 9:02 AM  Cc: Marcine Matar, MD

## 2022-07-20 NOTE — Patient Instructions (Signed)
We have sent the following medications to your pharmacy for you to pick up at your convenience: Hydrocortisone 2.5% cream twice daily as needed for hemorrhoids.   _______________________________________________________  If your blood pressure at your visit was 140/90 or greater, please contact your primary care physician to follow up on this.  _______________________________________________________  If you are age 44 or older, your body mass index should be between 23-30. Your Body mass index is 30.27 kg/m. If this is out of the aforementioned range listed, please consider follow up with your Primary Care Provider.  If you are age 68 or younger, your body mass index should be between 19-25. Your Body mass index is 30.27 kg/m. If this is out of the aformentioned range listed, please consider follow up with your Primary Care Provider.   ________________________________________________________  The [facility] providers would like to encourage you to use [facility] to communicate with providers for non-urgent requests or questions.  Due to long hold times on the telephone, sending your provider a message by [facility] may be a faster and more efficient way to get a response.  Please allow 48 business hours for a response.  Please remember that this is for non-urgent requests.  _______________________________________________________

## 2022-07-22 ENCOUNTER — Ambulatory Visit: Payer: No Typology Code available for payment source

## 2022-07-23 NOTE — Progress Notes (Signed)
Agree with assessment/plan.  Raj Amillya Chavira, MD [facility] [phone]  

## 2022-07-27 ENCOUNTER — Other Ambulatory Visit: Payer: Self-pay

## 2022-07-29 ENCOUNTER — Ambulatory Visit: Payer: Self-pay | Attending: Internal Medicine

## 2022-08-03 ENCOUNTER — Encounter: Payer: Self-pay | Admitting: Obstetrics and Gynecology

## 2022-08-03 ENCOUNTER — Other Ambulatory Visit: Payer: Self-pay

## 2022-08-03 ENCOUNTER — Ambulatory Visit: Payer: Self-pay | Attending: Internal Medicine | Admitting: Internal Medicine

## 2022-08-03 ENCOUNTER — Encounter: Payer: Self-pay | Admitting: Internal Medicine

## 2022-08-03 ENCOUNTER — Ambulatory Visit (INDEPENDENT_AMBULATORY_CARE_PROVIDER_SITE_OTHER): Payer: No Typology Code available for payment source | Admitting: Obstetrics and Gynecology

## 2022-08-03 VITALS — BP 128/89 | HR 74 | Wt 162.5 lb

## 2022-08-03 VITALS — BP 128/82 | HR 69 | Temp 98.3°F | Ht 62.0 in | Wt 162.0 lb

## 2022-08-03 DIAGNOSIS — Z758 Other problems related to medical facilities and other health care: Secondary | ICD-10-CM

## 2022-08-03 DIAGNOSIS — Z603 Acculturation difficulty: Secondary | ICD-10-CM

## 2022-08-03 DIAGNOSIS — L720 Epidermal cyst: Secondary | ICD-10-CM

## 2022-08-03 DIAGNOSIS — R062 Wheezing: Secondary | ICD-10-CM

## 2022-08-03 DIAGNOSIS — N83201 Unspecified ovarian cyst, right side: Secondary | ICD-10-CM

## 2022-08-03 DIAGNOSIS — G5601 Carpal tunnel syndrome, right upper limb: Secondary | ICD-10-CM

## 2022-08-03 DIAGNOSIS — I8392 Asymptomatic varicose veins of left lower extremity: Secondary | ICD-10-CM

## 2022-08-03 DIAGNOSIS — N80129 Deep endometriosis of ovary, unspecified ovary: Secondary | ICD-10-CM

## 2022-08-03 MED ORDER — ALBUTEROL SULFATE HFA 108 (90 BASE) MCG/ACT IN AERS
2.0000 | INHALATION_SPRAY | Freq: Four times a day (QID) | RESPIRATORY_TRACT | 2 refills | Status: DC | PRN
Start: 2022-08-03 — End: 2023-08-25
  Filled 2022-08-03: qty 6.7, 25d supply, fill #0

## 2022-08-03 MED ORDER — NORETHINDRONE 0.35 MG PO TABS
1.0000 | ORAL_TABLET | Freq: Every day | ORAL | 3 refills | Status: DC
  Filled 2022-08-03: qty 84, 84d supply, fill #0

## 2022-08-03 NOTE — Progress Notes (Signed)
Patient ID: Brooke Lam, female    DOB: 03/13/1981  MRN: [id number]  CC: Follow-up (Carpal tunnel f/u. /Reports that injections have helped - still feeling slight intermittent numbness/Reports feeling masses in lower back, itchy - requesting removal if possible/Requesting referral to pulmonology & vain and vascular specialist)   Subjective: Brooke Lam is a 42 y.o. female who presents for chronic disease management. Her concerns today include:  Pt with hx of hyperTG, PreDM, abdominal pannus, palpitations (normal echo and Holter.  Declined nightly propranolol by cardiology), right ovarian lesion (followed by GYN Dr. Clearance Coots 3/23 and 01/13/2022.  Endometrioma versus complicated hemorrhagic cyst.  Plan for follow-up ultrasound in 1 year), chronic left upper quadrant pain (followed by GI Dr. Orvan Falconer), meralgia paresthetica LL limb.   [employer] Language interpreter used during this encounter. #[id number], Maria   Bilateral hand pain/numbness: Nerve conduction study showed that she had mild CTS in the right hand but was normal in the left.  Referred to orthopedics Dr. August Saucer.  He felt that clinically CTS was more in the moderate range based on clinical hx/exam.  Ultrasound-guided steroid injection performed on the right wrist.  Given Mobic x 2 weeks.  Reports today that the injections have helped.  However she still feels slight intermittent numbness.  Has follow-up with Dr. August Saucer in June.  Seen by GI Dr. Orvan Falconer.  Evaluated for chronic LUQ pain and constipation.  CT of abdomen revealed bilobed 16mm hypodense lesion in the dome of the liver versus 2 small adjacent hypodense lesions measuring 6 and 7 mm respectively incompletely characterized on this exam but favored to reflect a benign etiology such as hepatic cysts or hemangiomas.  MRI with and without contrast recommended.  GI plans to do this within the next several months.  Also seen again incidental finding of right ovarian lesion  measuring 5.5 x 5.2 cm.  Pelvic ultrasound revealed right ovarian complex cyst that is stable.  She has an upcoming appointment with GYN in a few months.  Plans to keep the appt.  C/o feeling 2 masses on mid back on either side x 2 yrs.  Some itchiness and pain associated with them.  No increase in size.    Requests referral to Vein/Vascular. Feels "bumps on my veins" LT thigh.   No pain or swelling in the leg; just itches sometimes  Requests referral to pulmonary. Hears " a little noise in my lungs" which she had COVID 04/2022.  Had Cough that lasted 1 mth after COVID but since resolve.  Had chest tightness during COVID infection but this resolved as well. No SOB.  Notices the noise mostly at nights because it is quiet but also occurs during the day.       Patient Active Problem List   Diagnosis Date Noted   Cervix still in place 06/02/2022   Language barrier 06/01/2022   History of ovarian cyst 06/01/2022   PCB (post coital bleeding) 06/01/2022   Empty sella (HCC) 04/10/2022   Bilateral carpal tunnel syndrome 04/10/2022   Cephalalgia 04/10/2022   Mixed hyperlipidemia 07/15/2021   Prediabetes 07/15/2021   Fibroadenoma of breast, right 05/11/2019   Dyspareunia 11/12/2014   Dysuria 09/17/2011     Current Outpatient Medications on File Prior to Visit  Medication Sig Dispense Refill   hydrocortisone (ANUSOL-HC) 2.5 % rectal cream Place 1 Application rectally 2 (two) times daily as needed for hemorrhoids or anal itching. 30 g 1   lubiprostone (AMITIZA) 24 MCG capsule Take 1 capsule (24  mcg total) by mouth 2 (two) times daily with a meal. 60 capsule 3   meloxicam (MOBIC) 15 MG tablet 1 po q d x 2 wks then prn (Patient not taking: Reported on 08/03/2022) 30 tablet 0   SUMAtriptan (IMITREX) 50 MG tablet Take 1 tablet by mouth at start of headache.  May repeat in 2 hours if no relief.  Max 2 tabs/24 hr. (Patient not taking: Reported on 08/03/2022) 10 tablet 0   No current  facility-administered medications on file prior to visit.    Not on File  Social History   Socioeconomic History   Marital status: Married    Spouse name: Not on file   Number of children: 4   Years of education: Not on file   Highest education level: High school graduate  Occupational History   Occupation: bartender  Tobacco Use   Smoking status: Former    Types: Cigarettes    Quit date: 2005    Years since quitting: 19.3   Smokeless tobacco: Never  Vaping Use   Vaping Use: Never used  Substance and Sexual Activity   Alcohol use: Not Currently    Comment: every 8 days 5-8 drinks of wine and whiskey   Drug use: No   Sexual activity: Yes    Birth control/protection: None, Surgical  Other Topics Concern   Not on file  Social History Narrative   Right Handed    Drinks no caffeine    Social Determinants of Health   Financial Resource Strain: Not on file  Food Insecurity: No Food Insecurity (07/02/2022)   Hunger Vital Sign    Worried About Running Out of Food in the Last Year: Never true    Ran Out of Food in the Last Year: Never true  Transportation Needs: No Transportation Needs (07/02/2022)   PRAPARE - Administrator, Civil Service (Medical): No    Lack of Transportation (Non-Medical): No  Physical Activity: Not on file  Stress: Not on file  Social Connections: Not on file  Intimate Partner Violence: Not on file    Family History  Problem Relation Age of Onset   Diabetes Mother    Hypertension Mother    Diabetes Father    Hypertension Father    Diabetes Paternal Aunt    Breast cancer Paternal Aunt    Diabetes Paternal Grandmother    Anesthesia problems Neg Hx    Hearing loss Neg Hx    Other Neg Hx    Stomach cancer Neg Hx    Rectal cancer Neg Hx    Esophageal cancer Neg Hx    Colon cancer Neg Hx     Past Surgical History:  Procedure Laterality Date   ABDOMINAL HYSTERECTOMY     BREAST BIOPSY Right 2013   FA   CESAREAN SECTION     C/S x  4   UPPER GASTROINTESTINAL ENDOSCOPY  04/23/2020    ROS: Review of Systems Negative except as stated above  PHYSICAL EXAM: BP 128/82 (BP Location: Left Arm, Patient Position: Sitting, Cuff Size: Normal)   Pulse 69   Temp 98.3 F (36.8 C) (Oral)   Ht 5\' 2"  (1.575 m)   Wt 162 lb (73.5 kg)   LMP 10/08/2011   SpO2 99%   BMI 29.63 kg/m   Physical Exam  General appearance - alert, well appearing, middle-age Hispanic female and in no distress Mental status - normal mood, behavior, speech, dress, motor activity, and thought processes Chest - clear to auscultation,  no wheezes, rales or rhonchi, symmetric air entry Heart - normal rate, regular rhythm, normal S1, S2, no murmurs, rubs, clicks or gallops Extremities - mild amount of spider veins LT thigh.  They do not appear inflamed. Skin - 2x2 cm movable subcut mass LT posterior thorax below shoulder blade.  Patient could not find the other 1 that was supposed to be on the right side.      Latest Ref Rng & Units 05/20/2022    4:37 PM 04/10/2022    9:47 AM 04/09/2022   12:14 PM  CMP  Glucose 70 - 99 mg/dL 161     BUN 6 - 20 mg/dL 17     Creatinine 0.96 - 1.00 mg/dL 0.45   4.09   Sodium 811 - 145 mmol/L 135     Potassium 3.5 - 5.1 mmol/L 3.4     Chloride 98 - 111 mmol/L 100     CO2 22 - 32 mmol/L 25     Calcium 8.9 - 10.3 mg/dL 9.1     Total Protein 6.5 - 8.1 g/dL 8.1  7.9    Total Bilirubin 0.3 - 1.2 mg/dL 0.7  0.3    Alkaline Phos 38 - 126 U/L 59  77    AST 15 - 41 U/L 28  21    ALT 0 - 44 U/L 13  8     Lipid Panel     Component Value Date/Time   CHOL 144 04/10/2022 0947   TRIG 124 04/10/2022 0947   HDL 36 (L) 04/10/2022 0947   CHOLHDL 4.0 04/10/2022 0947   LDLCALC 86 04/10/2022 0947    CBC    Component Value Date/Time   WBC 8.0 05/20/2022 1637   RBC 4.54 05/20/2022 1637   HGB 12.3 05/20/2022 1637   HGB 12.4 12/04/2021 1442   HCT 37.0 05/20/2022 1637   HCT 36.9 12/04/2021 1442   PLT 268 05/20/2022 1637   PLT 289  12/04/2021 1442   MCV 81.5 05/20/2022 1637   MCV 81 12/04/2021 1442   MCH 27.1 05/20/2022 1637   MCHC 33.2 05/20/2022 1637   RDW 13.2 05/20/2022 1637   RDW 14.3 12/04/2021 1442   LYMPHSABS 2.4 12/04/2021 1442   MONOABS 0.4 07/28/2021 1711   EOSABS 0.1 12/04/2021 1442   BASOSABS 0.0 12/04/2021 1442    ASSESSMENT AND PLAN:  1. Carpal tunnel syndrome of right wrist Improved post steroid injection.  She plans to keep follow-up appointment with orthopedics next month.  2. Asymptomatic varicose vein of left lower extremity without complication Informed patient that she has mild spider veins that are asymptomatic.  No need to refer to vein and vascular at this time.  3. Cyst of right ovary Stable.  Keep upcoming appointment with gynecology  4. Epidermoid cyst of skin of back Since this is bothersome to her, we will refer to dermatology to have it removed. - Ambulatory referral to Dermatology  5. Wheezing History not very suggestive of asthma and her lungs are clear on exam.  Pulse ox is 99%.  Doubt scarring from COVID.  We will do a pulmonary function test to rule out asthma.  I have given her albuterol inhaler to use at night to see whether the wheezing decreases with this.  Went over with her how to properly use an inhaler. - albuterol (VENTOLIN HFA) 108 (90 Base) MCG/ACT inhaler; Inhale 2 puffs into the lungs every 6 (six) hours as needed for wheezing or shortness of breath.  Dispense: 8 g;  Refill: 2 - Pulmonary function test; Future    Patient was given the opportunity to ask questions.  Patient verbalized understanding of the plan and was able to repeat key elements of the plan.   This documentation was completed using Paediatric nurse.  Any transcriptional errors are unintentional.  Orders Placed This Encounter  Procedures   Ambulatory referral to Dermatology   Pulmonary function test     Requested Prescriptions   Signed Prescriptions Disp Refills    albuterol (VENTOLIN HFA) 108 (90 Base) MCG/ACT inhaler 8 g 2    Sig: Inhale 2 puffs into the lungs every 6 (six) hours as needed for wheezing or shortness of breath.    No follow-ups on file.  Jonah Blue, MD, FACP

## 2022-08-03 NOTE — Progress Notes (Unsigned)
Obstetrics and Gynecology Visit Return Patient Evaluation  Appointment Date: 08/03/2022  Primary Care Provider: [facility]: [facility]  Chief Complaint: f/u ultrasound visit  History of Present Illness:  Brooke Lam is a 42 y.o. with past medical history significant for multiple c-sections and h/o need for c-hyst due to accreta in 2014, pre-diabetes, hyperlipidemia, migraines, BMI 30   She was initially seen by me in March 2024 for h/o period rlq discomfort that came and went. History significant for chronic complex RO cyst. She had recent CT scan by GI (seen for LUQ pain) that showed a 5-6cm RO cyst and was referred to GYN. Patient seen a year ago at [facility] and was recommended expectant management.   When I saw her I ordered a repeat u/s which showed stable RO endometrioma and a CA125 which was negative and started her on micronor (pt is self pay)  Imaging history 03/2022 CT A/P w/ contrast 5-5.5cm RO cyst likely an endometrioma 05/2021 TVUS w/ 5cm RO likely endometrioma 05/2019 CT Renal stone 6-8cm pelvic cyst to the righ to midline.  10/2014 TVUS negative  Interval History: Since that time, she states that she has no pain or irregular bleeding.   Review of Systems: as noted in the History of Present Illness.   Patient Active Problem List   Diagnosis Date Noted   Endometrioma of ovary 08/03/2022   Cervix still in place 06/02/2022   Language barrier 06/01/2022   History of ovarian cyst 06/01/2022   PCB (post coital bleeding) 06/01/2022   Empty sella (HCC) 04/10/2022   Bilateral carpal tunnel syndrome 04/10/2022   Cephalalgia 04/10/2022   Mixed hyperlipidemia 07/15/2021   Prediabetes 07/15/2021   Fibroadenoma of breast, right 05/11/2019   Dyspareunia 11/12/2014   Dysuria 09/17/2011   Medications:  Brooke Lam had no medications administered during this visit. Current Outpatient Medications   Medication Sig Dispense Refill   albuterol (VENTOLIN HFA) 108 (90 Base) MCG/ACT inhaler Inhale 2 puffs into the lungs every 6 (six) hours as needed for wheezing or shortness of breath. 8 g 2   hydrocortisone (ANUSOL-HC) 2.5 % rectal cream Place 1 Application rectally 2 (two) times daily as needed for hemorrhoids or anal itching. 30 g 1   lubiprostone (AMITIZA) 24 MCG capsule Take 1 capsule (24 mcg total) by mouth 2 (two) times daily with a meal. 60 capsule 3   norethindrone (MICRONOR) 0.35 MG tablet Take 1 tablet (0.35 mg total) by mouth daily. 90 tablet 3   meloxicam (MOBIC) 15 MG tablet 1 po q d x 2 wks then prn (Patient not taking: Reported on 08/03/2022) 30 tablet 0   SUMAtriptan (IMITREX) 50 MG tablet Take 1 tablet by mouth at start of headache.  May repeat in 2 hours if no relief.  Max 2 tabs/24 hr. (Patient not taking: Reported on 08/03/2022) 10 tablet 0   No current facility-administered medications for this visit.    Allergies: has No Known Allergies.  Physical Exam:  BP 128/89   Pulse 74   Wt 162 lb 8 oz (73.7 kg)   LMP 10/08/2011   BMI 29.72 kg/m  Body mass index is 29.72 kg/m. General appearance: Well nourished, well developed female in no acute distress.  Abdomen: diffusely non tender to palpation, non distended, and no masses, hernias Neuro/Psych:  Normal mood and affect.    Radiology: Narrative & Impression  CLINICAL DATA:  h/o right ovarian cyst   EXAM: ULTRASOUND  OF PELVIS   TECHNIQUE: Transabdominal and transvaginalultrasound examination of the pelvis was performed including evaluation of the uterus, ovaries, adnexal regions, and pelvic cul-de-sac.   COMPARISON:  05/29/2021.   FINDINGS: Patient is status post hysterectomy.   There is a complex cystic lesion right ovary that measures 5.1 x 4.9 x 4.3 cm which is stable finding. Right ovary measurements 5.9 x 5.6 x 4.0 cm. Left ovary unremarkable measuring 3.8 x 2.5 x 2.4 cm.   IMPRESSION: Complex cyst  right ovary is a stable finding. The examination was otherwise unremarkable.     Electronically Signed   By: Layla Maw M.D.   On: 07/06/2022 17:56   Assessment: patient stable  Plan:  1. Endometrioma of ovary Long d/w patient re: the cyst. I told her that the images are classic for an endometrioma. I told her that surgical management is indicated if she has persistent and worsening s/s, increase in size, concerning & complex features or per patient preference due to concern. I told her that endometriomas do increase the baseline risk of ovarian cancer but her risk is still very low. I told her that surgical management is likely to be difficult not only due to her endometriosis but also her prior surgical history.  Based on everything, I told her I recommend six month repeat u/s and to let me know if her s/s come back/change.   Patient is amenable to six month follow up . - US PELVIC COMPLETE WITH TRANSVAGINAL; Future  In person interpreter used  RTC: 6m repeat u/s ordered  Return if symptoms worsen or fail to improve.  Future Appointments  Date Time Provider Department Center  08/26/2022  8:15 AM August Saucer, Corrie Mckusick, MD [facility] None  10/16/2022  9:50 AM Drema Dallas, DO [facility] None  10/19/2022  9:10 AM Marcine Matar, MD [facility] None  02/03/2023 10:30 AM [facility] [place]    Cornelia Copa MD Attending Center for [facility])

## 2022-08-07 ENCOUNTER — Other Ambulatory Visit: Payer: Self-pay

## 2022-08-19 ENCOUNTER — Ambulatory Visit (HOSPITAL_COMMUNITY)
Admission: RE | Admit: 2022-08-19 | Discharge: 2022-08-19 | Disposition: A | Discharge status: Home / Self Care | Payer: Self-pay | Source: Ambulatory Visit | Attending: Internal Medicine | Admitting: Internal Medicine

## 2022-08-19 DIAGNOSIS — R062 Wheezing: Secondary | ICD-10-CM | POA: Insufficient documentation

## 2022-08-19 LAB — PULMONARY FUNCTION TEST
DL/VA % pred: 104 %
DL/VA: 4.64 ml/min/mmHg/L
DLCO unc % pred: 80 %
DLCO unc: 16.8 ml/min/mmHg
FEF 25-75 Post: 4.53 L/sec
FEF 25-75 Pre: 4.38 L/sec
FEF2575-%Change-Post: 3 %
FEF2575-%Pred-Post: 152 %
FEF2575-%Pred-Pre: 147 %
FEV1-%Change-Post: 0 %
FEV1-%Pred-Post: 86 %
FEV1-%Pred-Pre: 87 %
FEV1-Post: 2.49 L
FEV1-Pre: 2.49 L
FEV1FVC-%Change-Post: 0 %
FEV1FVC-%Pred-Pre: 111 %
FEV6-%Change-Post: -1 %
FEV6-%Pred-Post: 77 %
FEV6-%Pred-Pre: 79 %
FEV6-Post: 2.7 L
FEV6-Pre: 2.75 L
FEV6FVC-%Pred-Post: 102 %
FEV6FVC-%Pred-Pre: 102 %
FVC-%Change-Post: 0 %
FVC-%Pred-Post: 77 %
FVC-%Pred-Pre: 77 %
FVC-Post: 2.73 L
FVC-Pre: 2.75 L
Post FEV1/FVC ratio: 91 %
Post FEV6/FVC ratio: 100 %
Pre FEV1/FVC ratio: 91 %
Pre FEV6/FVC Ratio: 100 %
RV % pred: 70 %
RV: 1.13 L
TLC % pred: 79 %
TLC: 3.9 L

## 2022-08-19 MED ORDER — ALBUTEROL SULFATE (2.5 MG/3ML) 0.083% IN NEBU: 2.5000 mg | INHALATION_SOLUTION | Freq: Once | RESPIRATORY_TRACT | Status: DC

## 2022-08-26 ENCOUNTER — Ambulatory Visit: Payer: Self-pay | Admitting: Orthopedic Surgery

## 2022-09-01 ENCOUNTER — Ambulatory Visit: Payer: MEDICAID

## 2022-09-28 ENCOUNTER — Telehealth: Payer: Self-pay | Admitting: *Deleted

## 2022-09-28 DIAGNOSIS — R932 Abnormal findings on diagnostic imaging of liver and biliary tract: Secondary | ICD-10-CM

## 2022-09-28 DIAGNOSIS — K769 Liver disease, unspecified: Secondary | ICD-10-CM

## 2022-09-28 NOTE — Telephone Encounter (Signed)
-----   Message from Mariane Duval, [place] sent at 05/27/2022 11:12 AM EST ----- Regarding: MRI Pt needs MRI ABD - abnormal CT of liver.

## 2022-10-06 ENCOUNTER — Emergency Department (HOSPITAL_COMMUNITY)
Admission: EM | Admit: 2022-10-06 | Discharge: 2022-10-06 | Discharge status: Against Medical Advice | Payer: Self-pay | Attending: Emergency Medicine | Admitting: Emergency Medicine

## 2022-10-06 ENCOUNTER — Other Ambulatory Visit: Payer: Self-pay

## 2022-10-06 ENCOUNTER — Encounter (HOSPITAL_COMMUNITY): Payer: Self-pay

## 2022-10-06 DIAGNOSIS — Z5321 Procedure and treatment not carried out due to patient leaving prior to being seen by health care provider: Secondary | ICD-10-CM | POA: Insufficient documentation

## 2022-10-06 DIAGNOSIS — M79602 Pain in left arm: Secondary | ICD-10-CM | POA: Insufficient documentation

## 2022-10-06 NOTE — ED Notes (Signed)
 Pt decided to leave before going to a room.

## 2022-10-06 NOTE — ED Triage Notes (Signed)
Says 2 hours ago left arm briefly felt hot, but was not physically hot. Then returned to normal.   After that had a brief period of similar sx with both legs.   Denies sensation currently.
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
## 2022-10-16 ENCOUNTER — Encounter: Payer: Self-pay | Admitting: Neurology

## 2022-10-16 ENCOUNTER — Ambulatory Visit: Payer: Self-pay | Admitting: Neurology

## 2022-10-16 DIAGNOSIS — Z029 Encounter for administrative examinations, unspecified: Secondary | ICD-10-CM

## 2022-10-19 ENCOUNTER — Ambulatory Visit: Payer: Self-pay | Attending: Internal Medicine | Admitting: Internal Medicine

## 2022-10-19 ENCOUNTER — Encounter: Payer: Self-pay | Admitting: Internal Medicine

## 2022-10-19 VITALS — BP 117/77 | HR 67 | Ht 62.0 in | Wt 165.0 lb

## 2022-10-19 DIAGNOSIS — R252 Cramp and spasm: Secondary | ICD-10-CM

## 2022-10-19 DIAGNOSIS — R7303 Prediabetes: Secondary | ICD-10-CM

## 2022-10-19 DIAGNOSIS — Z114 Encounter for screening for human immunodeficiency virus [HIV]: Secondary | ICD-10-CM

## 2022-10-19 DIAGNOSIS — Z23 Encounter for immunization: Secondary | ICD-10-CM

## 2022-10-19 DIAGNOSIS — Z1159 Encounter for screening for other viral diseases: Secondary | ICD-10-CM

## 2022-10-19 LAB — POCT GLYCOSYLATED HEMOGLOBIN (HGB A1C): HbA1c, POC (prediabetic range): 5.8 % (ref 5.7–6.4)

## 2022-10-19 LAB — GLUCOSE, POCT (MANUAL RESULT ENTRY): POC Glucose: 110 mg/dl — AB (ref 70–99)

## 2022-10-19 MED ORDER — TETANUS-DIPHTH-ACELL PERTUSSIS 5-2-15.5 LF-MCG/0.5 IM SUSP: 0.5000 mL | Freq: Once | INTRAMUSCULAR | 0 refills | Status: AC

## 2022-10-19 NOTE — Progress Notes (Deleted)
Patient ID: Brooke Lam, female    DOB: 1980/06/20  MRN: [id number]  CC: Hyperlipidemia (Hyperlipidemia f/u. Nicki Reaper cramps on lower R leg - worsens at night /Requesting blood work)   Subjective: Brooke Lam is a 42 y.o. female who presents for chronic ds management Her concerns today include:  Pt with hx of hyperTG, PreDM, abdominal pannus, palpitations (normal echo and Holter. Declined nightly propranolol by cardiology), right ovarian lesion (followed by GYN Dr. Clearance Coots 3/23 and 01/13/2022. Endometrioma versus complicated hemorrhagic cyst. Saw Dr. Vergie Living 07/2022.  Endometrioma.  Follow-up ultrasound in 6 months), chronic left upper quadrant pain (followed by GI Dr. Orvan Falconer), meralgia paresthetica LL limb, CTS RT.   [employer] Language interpreter used during this encounter. # [name] [phone]   Pt c/o cramps in calve RT>>LT at nights x 2-3 mths.   Occurs from 3x/wk to daily and sometimes no days at all. Last seconds to 1/2 min.  Has to move leg.  Does not occur during the day when she is active.    PreDM  Results for orders placed or performed in visit on 10/19/22  POCT glucose (manual entry)  Result Value Ref Range   POC Glucose 110 (A) 70 - 99 mg/dl  POCT glycosylated hemoglobin (Hb A1C)  Result Value Ref Range   Hemoglobin A1C     HbA1c POC (<> result, manual entry)     HbA1c, POC (prediabetic range) 5.8 5.7 - 6.4 %   HbA1c, POC (controlled diabetic range)    -A1C has improved from 6.3 six months ago -Reports she has cut back on sugars. Eating more fish and less greasy foods Walking 1 hr a day Wonders whether cholesterol levels need to be checked again.  Levels had nl when last checked 03/2022.  HM:  due for hepc/HIV screen.  Agrees to be screen.  Due for Tdapt.   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 60 capsule 3   meloxicam (MOBIC) 15 MG tablet 1 po q d x 2 wks then prn 30 tablet 0   norethindrone (MICRONOR) 0.35 MG tablet Take 1 tablet (0.35 mg total) by mouth daily. 84 tablet 3   SUMAtriptan (IMITREX) 50 MG tablet Take 1 tablet by mouth at start of headache.  May repeat in 2 hours if no relief.  Max 2 tabs/24 hr. (Patient taking differently: Take 1 tablet by mouth at start of headache.  May repeat in 2 hours if no relief.  Max 2 tabs/24 hr.) 10 tablet 0   albuterol (VENTOLIN HFA) 108 (90 Base) MCG/ACT inhaler Inhale 2 puffs into the lungs every 6 (six) hours as needed for wheezing or shortness of breath. (Patient not taking: Reported on 10/19/2022) 8 g 2   hydrocortisone (ANUSOL-HC) 2.5 % rectal cream Place 1 Application rectally 2 (two) times daily as needed for  hemorrhoids or anal itching. (Patient not taking: Reported on 10/19/2022) 30 g 1   No current facility-administered medications on file prior to visit.    No Known Allergies  Social History   Socioeconomic History   Marital status: Married    Spouse name: Not on file   Number of children: 4   Years of education: Not on file   Highest education level: High school graduate  Occupational History   Occupation: bartender  Tobacco Use   Smoking status: Former    Current packs/day: 0.00    Types: Cigarettes    Quit date: 2005    Years since quitting: 19.5   Smokeless tobacco: Never  Vaping Use   Vaping status: Never Used  Substance and  Sexual Activity   Alcohol use: Not Currently    Comment: every 8 days 5-8 drinks of wine and whiskey   Drug use: No   Sexual activity: Yes    Birth control/protection: None, Surgical  Other Topics Concern   Not on file  Social History Narrative   Right Handed    Drinks no caffeine    Social Determinants of Health   Financial Resource Strain: Not on file  Food Insecurity: No Food Insecurity (07/02/2022)   Hunger Vital Sign    Worried About Running Out of Food in the Last Year: Never true    Ran Out of Food in the Last Year: Never true  Transportation Needs: No Transportation Needs (07/02/2022)   PRAPARE - Administrator, Civil Service (Medical): No    Lack of Transportation (Non-Medical): No  Physical Activity: Not on file  Stress: Not on file  Social Connections: Not on file  Intimate Partner Violence: Not on file    Family History  Problem Relation Age of Onset   Diabetes Mother    Hypertension Mother    Diabetes Father    Hypertension Father    Diabetes Paternal Aunt    Breast cancer Paternal Aunt    Diabetes Paternal Grandmother    Anesthesia problems Neg Hx    Hearing loss Neg Hx    Other Neg Hx    Stomach cancer Neg Hx    Rectal cancer Neg Hx    Esophageal cancer Neg Hx    Colon cancer Neg Hx     Past Surgical History:  Procedure Laterality Date   ABDOMINAL HYSTERECTOMY     BREAST BIOPSY Right 2013   FA   CESAREAN SECTION     C/S x 4   UPPER GASTROINTESTINAL ENDOSCOPY  04/23/2020    ROS: Review of Systems Negative except as stated above  PHYSICAL EXAM: BP 117/77 (BP Location: Left Arm, Patient Position: Sitting, Cuff Size: Normal)   Pulse 67   Ht 5\' 2"  (1.575 m)   Wt 165 lb (74.8 kg)   LMP 10/08/2011   SpO2 100%   BMI 30.18 kg/m   Wt Readings from Last 3 Encounters:  10/19/22 165 lb (74.8 kg)  10/06/22 160 lb (72.6 kg)  08/03/22 162 lb 8 oz (73.7 kg)    Physical Exam  General appearance - alert, well appearing, and in no  distress Mental status - normal mood, behavior, speech, dress, motor activity, and thought processes Ext:  good DP, PT, and popliteal pulses.       Latest Ref Rng & Units 05/20/2022    4:37 PM 04/10/2022    9:47 AM 04/09/2022   12:14 PM  CMP  Glucose 70 - 99 mg/dL 528  BUN 6 - 20 mg/dL 17     Creatinine 4.09 - 1.00 mg/dL 8.11   9.14   Sodium 782 - 145 mmol/L 135     Potassium 3.5 - 5.1 mmol/L 3.4     Chloride 98 - 111 mmol/L 100     CO2 22 - 32 mmol/L 25     Calcium 8.9 - 10.3 mg/dL 9.1     Total Protein 6.5 - 8.1 g/dL 8.1  7.9    Total Bilirubin 0.3 - 1.2 mg/dL 0.7  0.3    Alkaline Phos 38 - 126 U/L 59  77    AST 15 - 41 U/L 28  21    ALT 0 - 44 U/L 13  8     Lipid Panel     Component Value Date/Time   CHOL 144 04/10/2022 0947   TRIG 124 04/10/2022 0947   HDL 36 (L) 04/10/2022 0947   CHOLHDL 4.0 04/10/2022 0947   LDLCALC 86 04/10/2022 0947    CBC    Component Value Date/Time   WBC 8.0 05/20/2022 1637   RBC 4.54 05/20/2022 1637   HGB 12.3 05/20/2022 1637   HGB 12.4 12/04/2021 1442   HCT 37.0 05/20/2022 1637   HCT 36.9 12/04/2021 1442   PLT 268 05/20/2022 1637   PLT 289 12/04/2021 1442   MCV 81.5 05/20/2022 1637   MCV 81 12/04/2021 1442   MCH 27.1 05/20/2022 1637   MCHC 33.2 05/20/2022 1637   RDW 13.2 05/20/2022 1637   RDW 14.3 12/04/2021 1442   LYMPHSABS 2.4 12/04/2021 1442   MONOABS 0.4 07/28/2021 1711   EOSABS 0.1 12/04/2021 1442   BASOSABS 0.0 12/04/2021 1442    ASSESSMENT AND PLAN:  1. Prediabetes *** - POCT glucose (manual entry) - POCT glycosylated hemoglobin (Hb A1C)    Patient was given the opportunity to ask questions.  Patient verbalized understanding of the plan and was able to repeat key elements of the plan.   This documentation was completed using Paediatric nurse.  Any transcriptional errors are unintentional.  Orders Placed This Encounter  Procedures   POCT glucose (manual entry)   POCT glycosylated  hemoglobin (Hb A1C)     Requested Prescriptions    No prescriptions requested or ordered in this encounter    No follow-ups on file.  Jonah Blue, MD, FACP

## 2022-10-19 NOTE — Progress Notes (Signed)
Patient ID: Brooke Lam, female    DOB: 1980/09/20  MRN: [id number]  CC: Hyperlipidemia (Hyperlipidemia f/u. Nicki Reaper cramps on lower R leg - worsens at night /Requesting blood work)   Subjective: Brooke Lam is a 42 y.o. female who presents for chronic ds management Her concerns today include:  Pt with hx of hyperTG, PreDM, abdominal pannus, palpitations (normal echo and Holter. Declined nightly propranolol by cardiology), right ovarian lesion (followed by GYN Dr. Clearance Coots 3/23 and 01/13/2022. Endometrioma versus complicated hemorrhagic cyst. Saw Dr. Vergie Living 07/2022.  Endometrioma.  Follow-up ultrasound in 6 months), chronic left upper quadrant pain (followed by GI Dr. Orvan Falconer), meralgia paresthetica LL limb, CTS RT.   [employer] Language interpreter used during this encounter. # [name] [phone]   Pt c/o cramps in calve RT>>LT at nights x 2-3 mths.   Occurs from 3x/wk to daily and sometimes no days at all. Last seconds to 1/2 min.  Has to move leg.  Does not occur during the day when she is active.    PreDM  Results for orders placed or performed in visit on 10/19/22  POCT glucose (manual entry)  Result Value Ref Range   POC Glucose 110 (A) 70 - 99 mg/dl  POCT glycosylated hemoglobin (Hb A1C)  Result Value Ref Range   Hemoglobin A1C     HbA1c POC (<> result, manual entry)     HbA1c, POC (prediabetic range) 5.8 5.7 - 6.4 %   HbA1c, POC (controlled diabetic range)    -A1C has improved from 6.3 six months ago -Reports she has cut back on sugars. Eating more fish and less greasy foods Walking 1 hr a day Wonders whether cholesterol levels need to be checked again.  Levels had nl when last checked 03/2022.  HM:  due for hepc/HIV screen.  Agrees to be screen.  Due for Tdapt.   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 60 capsule 3   meloxicam (MOBIC) 15 MG tablet 1 po q d x 2 wks then prn 30 tablet 0   norethindrone (MICRONOR) 0.35 MG tablet Take 1 tablet (0.35 mg total) by mouth daily. 84 tablet 3   SUMAtriptan (IMITREX) 50 MG tablet Take 1 tablet by mouth at start of headache.  May repeat in 2 hours if no relief.  Max 2 tabs/24 hr. (Patient taking differently: Take 1 tablet by mouth at start of headache.  May repeat in 2 hours if no relief.  Max 2 tabs/24 hr.) 10 tablet 0   albuterol (VENTOLIN HFA) 108 (90 Base) MCG/ACT inhaler Inhale 2 puffs into the lungs every 6 (six) hours as needed for wheezing or shortness of breath. (Patient not taking: Reported on 10/19/2022) 8 g 2   hydrocortisone (ANUSOL-HC) 2.5 % rectal cream Place 1 Application rectally 2 (two) times daily as needed for  hemorrhoids or anal itching. (Patient not taking: Reported on 10/19/2022) 30 g 1   No current facility-administered medications on file prior to visit.    No Known Allergies  Social History   Socioeconomic History   Marital status: Married    Spouse name: Not on file   Number of children: 4   Years of education: Not on file   Highest education level: High school graduate  Occupational History   Occupation: bartender  Tobacco Use   Smoking status: Former    Current packs/day: 0.00    Types: Cigarettes    Quit date: 2005    Years since quitting: 19.5   Smokeless tobacco: Never  Vaping Use   Vaping status: Never Used  Substance and  Sexual Activity   Alcohol use: Not Currently    Comment: every 8 days 5-8 drinks of wine and whiskey   Drug use: No   Sexual activity: Yes    Birth control/protection: None, Surgical  Other Topics Concern   Not on file  Social History Narrative   Right Handed    Drinks no caffeine    Social Determinants of Health   Financial Resource Strain: Not on file  Food Insecurity: No Food Insecurity (07/02/2022)   Hunger Vital Sign    Worried About Running Out of Food in the Last Year: Never true    Ran Out of Food in the Last Year: Never true  Transportation Needs: No Transportation Needs (07/02/2022)   PRAPARE - Administrator, Civil Service (Medical): No    Lack of Transportation (Non-Medical): No  Physical Activity: Not on file  Stress: Not on file  Social Connections: Not on file  Intimate Partner Violence: Not on file    Family History  Problem Relation Age of Onset   Diabetes Mother    Hypertension Mother    Diabetes Father    Hypertension Father    Diabetes Paternal Aunt    Breast cancer Paternal Aunt    Diabetes Paternal Grandmother    Anesthesia problems Neg Hx    Hearing loss Neg Hx    Other Neg Hx    Stomach cancer Neg Hx    Rectal cancer Neg Hx    Esophageal cancer Neg Hx    Colon cancer Neg Hx     Past Surgical History:  Procedure Laterality Date   ABDOMINAL HYSTERECTOMY     BREAST BIOPSY Right 2013   FA   CESAREAN SECTION     C/S x 4   UPPER GASTROINTESTINAL ENDOSCOPY  04/23/2020    ROS: Review of Systems Negative except as stated above  PHYSICAL EXAM: BP 117/77 (BP Location: Left Arm, Patient Position: Sitting, Cuff Size: Normal)   Pulse 67   Ht 5\' 2"  (1.575 m)   Wt 165 lb (74.8 kg)   LMP 10/08/2011   SpO2 100%   BMI 30.18 kg/m   Wt Readings from Last 3 Encounters:  10/19/22 165 lb (74.8 kg)  10/06/22 160 lb (72.6 kg)  08/03/22 162 lb 8 oz (73.7 kg)    Physical Exam  General appearance - alert, well appearing, and in no  distress Mental status - normal mood, behavior, speech, dress, motor activity, and thought processes Ext:  good DP, PT, and popliteal pulses.       Latest Ref Rng & Units 05/20/2022    4:37 PM 04/10/2022    9:47 AM 04/09/2022   12:14 PM  CMP  Glucose 70 - 99 mg/dL 865  BUN 6 - 20 mg/dL 17     Creatinine 6.04 - 1.00 mg/dL 5.40   9.81   Sodium 191 - 145 mmol/L 135     Potassium 3.5 - 5.1 mmol/L 3.4     Chloride 98 - 111 mmol/L 100     CO2 22 - 32 mmol/L 25     Calcium 8.9 - 10.3 mg/dL 9.1     Total Protein 6.5 - 8.1 g/dL 8.1  7.9    Total Bilirubin 0.3 - 1.2 mg/dL 0.7  0.3    Alkaline Phos 38 - 126 U/L 59  77    AST 15 - 41 U/L 28  21    ALT 0 - 44 U/L 13  8     Lipid Panel     Component Value Date/Time   CHOL 144 04/10/2022 0947   TRIG 124 04/10/2022 0947   HDL 36 (L) 04/10/2022 0947   CHOLHDL 4.0 04/10/2022 0947   LDLCALC 86 04/10/2022 0947    CBC    Component Value Date/Time   WBC 8.0 05/20/2022 1637   RBC 4.54 05/20/2022 1637   HGB 12.3 05/20/2022 1637   HGB 12.4 12/04/2021 1442   HCT 37.0 05/20/2022 1637   HCT 36.9 12/04/2021 1442   PLT 268 05/20/2022 1637   PLT 289 12/04/2021 1442   MCV 81.5 05/20/2022 1637   MCV 81 12/04/2021 1442   MCH 27.1 05/20/2022 1637   MCHC 33.2 05/20/2022 1637   RDW 13.2 05/20/2022 1637   RDW 14.3 12/04/2021 1442   LYMPHSABS 2.4 12/04/2021 1442   MONOABS 0.4 07/28/2021 1711   EOSABS 0.1 12/04/2021 1442   BASOSABS 0.0 12/04/2021 1442    ASSESSMENT AND PLAN:  1. Prediabetes A1C improved Continue healthy eating and regular exercise - POCT glucose (manual entry) - POCT glycosylated hemoglobin (Hb A1C)   2. Leg cramps Check lytes. Too fleeting to warrant use of muscle relaxant - Basic Metabolic Panel  3. Need for Tdap vaccination Agreeable to receiving vaccine - Tdap (ADACEL) 07-22-13.5 LF-MCG/0.5 injection; Inject 0.5 mLs into the muscle once for 1 dose.  Dispense: 0.5 mL; Refill: 0  4. Screening for HIV (human  immunodeficiency virus) - HIV antibody (with reflex)  5. Need for hepatitis C screening test - Hepatitis C Antibody    Patient was given the opportunity to ask questions.  Patient verbalized understanding of the plan and was able to repeat key elements of the plan.   This documentation was completed using Paediatric nurse.  Any transcriptional errors are unintentional.  Orders Placed This Encounter  Procedures   POCT glucose (manual entry)   POCT glycosylated hemoglobin (Hb A1C)     Requested Prescriptions    No prescriptions requested or ordered in this encounter    No follow-ups on file.  Jonah Blue, MD, FACP

## 2022-10-20 ENCOUNTER — Other Ambulatory Visit: Payer: Self-pay

## 2022-10-20 ENCOUNTER — Telehealth: Payer: Self-pay

## 2022-10-20 NOTE — Telephone Encounter (Signed)
Patient came in stating she went to the pharmacy to get the vaccine for the Tdap but the pharmacy said they won't give it to her because she doesn't have insurance she is wanting to know if there is another place she can go to get this done. They did give her an application for her to fill out I advised her to fill it out as well.

## 2022-10-21 NOTE — Telephone Encounter (Signed)
Called & spoke to the patient. Patient stated that application at our pharmacy required a information that she is unable to complete at this time. Advised patient to go to the [facility] health Department. Patient expressed verbal understanding. No further questions at this time.

## 2022-10-23 ENCOUNTER — Ambulatory Visit (HOSPITAL_COMMUNITY)
Admission: RE | Admit: 2022-10-23 | Discharge: 2022-10-23 | Disposition: A | Discharge status: Home / Self Care | Payer: Self-pay | Source: Ambulatory Visit | Attending: Physician Assistant | Admitting: Physician Assistant

## 2022-10-23 ENCOUNTER — Ambulatory Visit (HOSPITAL_COMMUNITY): Payer: Self-pay

## 2022-10-23 DIAGNOSIS — R932 Abnormal findings on diagnostic imaging of liver and biliary tract: Secondary | ICD-10-CM | POA: Insufficient documentation

## 2022-10-23 DIAGNOSIS — K769 Liver disease, unspecified: Secondary | ICD-10-CM | POA: Insufficient documentation

## 2022-10-23 MED ORDER — GADOBUTROL 1 MMOL/ML IV SOLN
7.0000 mL | Freq: Once | INTRAVENOUS | Status: AC | PRN
  Administered 2022-10-23: 7 mL via INTRAVENOUS

## 2022-11-16 ENCOUNTER — Telehealth: Payer: Self-pay | Admitting: Neurology

## 2022-11-16 NOTE — Telephone Encounter (Signed)
Patient would like a call to discuss getting images

## 2022-11-19 NOTE — Telephone Encounter (Signed)
I advised to patient to contact PCP, she thanked me for calling

## 2022-12-21 ENCOUNTER — Encounter: Payer: Self-pay | Admitting: Dermatology

## 2022-12-21 ENCOUNTER — Ambulatory Visit (INDEPENDENT_AMBULATORY_CARE_PROVIDER_SITE_OTHER): Payer: Self-pay | Admitting: Dermatology

## 2022-12-21 VITALS — BP 124/82

## 2022-12-21 DIAGNOSIS — D171 Benign lipomatous neoplasm of skin and subcutaneous tissue of trunk: Secondary | ICD-10-CM

## 2022-12-21 NOTE — Patient Instructions (Addendum)
Hello Brooke Lam,  Thank you for visiting my office today. Your dedication to your health and addressing your skin condition is greatly appreciated. Below is a summary of our discussion and the key instructions for your care:  Growths on your back --> Lipomas - Observation Strategy: Continue to monitor the lipomas for any changes in size or symptoms. We will adopt a wait-and-see approach.  - Surgical Consideration: Should the lipomas become consistently painful or significantly larger, surgery with Dr. Pasi is an option we can consider.  - Follow-Up Appointment: A follow-up appointment is scheduled in 6 months to assess any changes in the lipomas.  - Skin Care: I have provided you with samples of skin care products to keep your skin hydrated, especially important during the winter months. Maintaining skin hydration is crucial to prevent other skin issues.  - Monitoring: Please keep an eye on the lipomas for any growth or increased pain and contact our office if these symptoms occur.  Should you have any further questions or notice any changes in your condition, please do not hesitate to reach out to our office.  Warm regards,  Dr. Langston [facility]  Important Information  Due to recent changes in healthcare laws, you may see results of your pathology and/or laboratory studies on MyChart before the doctors have had a chance to review them. We understand that in some cases there may be results that are confusing or concerning to you. Please understand that not all results are received at the same time and often the doctors may need to interpret multiple results in order to provide you with the best plan of care or course of treatment. Therefore, we ask that you please give [place] 2 business days to thoroughly review all your results before contacting the office for clarification. Should we see a critical lab result, you will be contacted sooner.   If You Need Anything After Your Visit  If  you have any questions or concerns for your doctor, please call our main line at [phone] If no one answers, please leave a voicemail as directed and we will return your call as soon as possible. Messages left after 4 pm will be answered the following business day.   You may also send [place] a message via MyChart. We typically respond to MyChart messages within 1-2 business days.  For prescription refills, please ask your pharmacy to contact our office. Our fax number is [phone].  If you have an urgent issue when the clinic is closed that cannot wait until the next business day, you can page your doctor at the number below.    Please note that while we do our best to be available for urgent issues outside of office hours, we are not available 24/7.   If you have an urgent issue and are unable to reach [place], you may choose to seek medical care at your doctor's office, retail clinic, urgent care center, or emergency room.  If you have a medical emergency, please immediately call 911 or go to the emergency department. In the event of inclement weather, please call our main line at [phone] for an update on the status of any delays or closures.  Dermatology Medication Tips: Please keep the boxes that topical medications come in in order to help keep track of the instructions about where and how to use these. Pharmacies typically print the medication instructions only on the boxes and not directly on the medication tubes.   If your medication is too  expensive, please contact our office at [phone] or send [place] a message through MyChart.   We are unable to tell what your co-pay for medications will be in advance as this is different depending on your insurance coverage. However, we may be able to find a substitute medication at lower cost or fill out paperwork to get insurance to cover a needed medication.   If a prior authorization is required to get your medication covered by your  insurance company, please allow [place] 1-2 business days to complete this process.  Drug prices often vary depending on where the prescription is filled and some pharmacies may offer cheaper prices.  The website www.goodrx.com contains coupons for medications through different pharmacies. The prices here do not account for what the cost may be with help from insurance (it may be cheaper with your insurance), but the website can give you the price if you did not use any insurance.  - You can print the associated coupon and take it with your prescription to the pharmacy.  - You may also stop by our office during regular business hours and pick up a GoodRx coupon card.  - If you need your prescription sent electronically to a different pharmacy, notify our office through [facility] or by phone at [phone]

## 2022-12-21 NOTE — Progress Notes (Signed)
   New Patient Visit   Subjective  Brooke Lam is a 42 y.o. female who presents for the following: 2 bumps on  lower back ~23yrs, itchy prn, tender, no hx of draining    The following portions of the chart were reviewed this encounter and updated as appropriate: medications, allergies, medical history  Review of Systems:  No other skin or systemic complaints except as noted in HPI or Assessment and Plan.  Objective  Well appearing patient in no apparent distress; mood and affect are within normal limits.   A focused examination was performed of the following areas: back  Relevant exam findings are noted in the Assessment and Plan.    Assessment & Plan   Lipoma  R back, L back Exam: Subcutaneous rubbery nodule(s)  Location: R and L lower back  Benign-appearing. Exam most consistent with a lipoma. Discussed that a lipoma is a benign fatty growth that can grow over time and sometimes get irritated. Recommend observation if it is not bothersome or changing. Discussed option of ILK injections or surgical excision to remove it if it is growing, symptomatic, or other changes noted. Please call for new or changing lesions so they can be evaluated.  Discussed with excision possibility of Keloid formation.   Return in about 6 months (around 06/20/2023) for Lipoma f/u.  I, Ardis Rowan, RMA, am acting as scribe for [employer], DO .   Documentation: I have reviewed the above documentation for accuracy and completeness, and I agree with the above.  Langston Reusing, DO

## 2023-01-11 ENCOUNTER — Ambulatory Visit: Payer: Self-pay | Attending: Cardiology | Admitting: Cardiology

## 2023-02-03 ENCOUNTER — Ambulatory Visit (HOSPITAL_COMMUNITY): Payer: Self-pay

## 2023-02-15 ENCOUNTER — Other Ambulatory Visit: Payer: Self-pay

## 2023-02-15 ENCOUNTER — Ambulatory Visit (HOSPITAL_COMMUNITY)
Admission: RE | Admit: 2023-02-15 | Discharge: 2023-02-15 | Disposition: A | Discharge status: Home / Self Care | Payer: Self-pay | Source: Ambulatory Visit | Attending: Obstetrics and Gynecology | Admitting: Obstetrics and Gynecology

## 2023-02-15 ENCOUNTER — Telehealth: Payer: Self-pay | Admitting: Physician Assistant

## 2023-02-15 DIAGNOSIS — N80129 Deep endometriosis of ovary, unspecified ovary: Secondary | ICD-10-CM | POA: Insufficient documentation

## 2023-02-15 MED ORDER — PANTOPRAZOLE SODIUM 40 MG PO TBEC
40.0000 mg | DELAYED_RELEASE_TABLET | Freq: Two times a day (BID) | ORAL | 0 refills | Status: DC
  Filled 2023-02-15: qty 60, 30d supply, fill #0

## 2023-02-15 NOTE — Telephone Encounter (Signed)
Sent refill to patients pharmacy.

## 2023-02-15 NOTE — Telephone Encounter (Signed)
Pt requested to have medication (pantoprazole 40 MG) refilled

## 2023-02-16 ENCOUNTER — Other Ambulatory Visit: Payer: Self-pay

## 2023-02-24 ENCOUNTER — Other Ambulatory Visit: Payer: Self-pay

## 2023-02-24 ENCOUNTER — Encounter: Payer: Self-pay | Admitting: Physician Assistant

## 2023-02-24 ENCOUNTER — Ambulatory Visit (INDEPENDENT_AMBULATORY_CARE_PROVIDER_SITE_OTHER): Payer: Self-pay | Admitting: Physician Assistant

## 2023-02-24 VITALS — BP 120/84 | HR 84 | Ht 62.0 in | Wt 168.0 lb

## 2023-02-24 DIAGNOSIS — K760 Fatty (change of) liver, not elsewhere classified: Secondary | ICD-10-CM

## 2023-02-24 DIAGNOSIS — K2100 Gastro-esophageal reflux disease with esophagitis, without bleeding: Secondary | ICD-10-CM

## 2023-02-24 DIAGNOSIS — K219 Gastro-esophageal reflux disease without esophagitis: Secondary | ICD-10-CM

## 2023-02-24 DIAGNOSIS — D1803 Hemangioma of intra-abdominal structures: Secondary | ICD-10-CM

## 2023-02-24 MED ORDER — PANTOPRAZOLE SODIUM 40 MG PO TBEC
40.0000 mg | DELAYED_RELEASE_TABLET | Freq: Every day | ORAL | 11 refills | Status: DC
  Filled 2023-02-24: qty 30, 30d supply, fill #0

## 2023-02-24 NOTE — Progress Notes (Signed)
Chief Complaint: Follow-up GERD  HPI:    [name] is a 42 year old Hispanic speaking female with a past medical history as listed below, known to Dr. Chales Abrahams, who presents to clinic today for follow-up of her GERD and refills of her medicine.    05/27/2022 office visit with Dr. Orvan Falconer for abdominal pain.  At that time described left upper quadrant pain with nausea and bloating as well as abnormal liver on the CT, gastritis and duodenitis on EGD in 2022, stool burden on recent CT, ovarian lesion.  At that time continued on Pantoprazole 40 twice daily and Dicyclomine 20 4 times daily.  Given a trial Of Amitiza 24 twice daily instead of Linzess due to cost.  Scheduled for colonoscopy with a 2-day bowel prep and MRI of the liver to follow-up abnormal CT in 3 to 4 months.  Referred to GYN for ovarian cyst.    06/09/2022 colonoscopy was normal except for external hemorrhoids.  Repeat recommended in 10 years.    07/20/2022 patient seen in clinic accompanied by an interpreter and describe doing fairly well on her Pantoprazole 40 twice daily, Dicyclomine 20 twice daily and Amitiza 24 mcg twice a day.  At that time continued current medications.  It sounded like her left upper quadrant pain was possibly splenic flexure syndrome and releasing gas help.  No red flags.  We discussed hemorrhoids and she was prescribed Hydrocortisone cream 2.5% to be applied twice daily for 7 to 14 days.  Discussed a repeat MRI for further evaluation of abnormal liver CT.    10/24/2022 MRI of the abdomen showed bilobed T2 hyperintense 12 x 6 mm lesion in the hepatic dome demonstrated questionable delayed peripheral nodular discontinuous enhancement and favored an atypical benign hemangioma.  At that point recommended repeat MRI in 1 year with repeat LFTs.    Today, patient seen with interpreter, tells me she is doing well.  In fact she does not use her Amitiza or Dicyclomine anymore.  She does continue on Pantoprazole but really  takes this maybe once every other day or so for reflux/bloating.  When she uses it it works well for her and she would like a refill.    Also has some questions in regards to fatty liver and liver imaging.  Denies any new complaints or concerns.    Patient has intentionally changed her diet and lost about 15 pounds over the past couple of years.    Denies fever, chills or change in bowel habits.  Past Medical History:  Diagnosis Date   Anemia    Blood transfusion without reported diagnosis    Depression    Ectopic pregnancy    Hyperlipidemia    Hypertension    Preterm labor     Past Surgical History:  Procedure Laterality Date   ABDOMINAL HYSTERECTOMY     BREAST BIOPSY Right 2013   FA   CESAREAN SECTION     C/S x 4   UPPER GASTROINTESTINAL ENDOSCOPY  04/23/2020    Current Outpatient Medications  Medication Sig Dispense Refill   albuterol (VENTOLIN HFA) 108 (90 Base) MCG/ACT inhaler Inhale 2 puffs into the lungs every 6 (six) hours as needed for wheezing or shortness of breath. (Patient not taking: Reported on 10/19/2022) 8 g 2   hydrocortisone (ANUSOL-HC) 2.5 % rectal cream Place 1 Application rectally 2 (two) times daily as needed for hemorrhoids or anal itching. (Patient not taking: Reported on 10/19/2022) 30 g 1   lubiprostone (AMITIZA) 24 MCG capsule  Take 1 capsule (24 mcg total) by mouth 2 (two) times daily with a meal. 60 capsule 3   meloxicam (MOBIC) 15 MG tablet 1 po q d x 2 wks then prn 30 tablet 0   norethindrone (MICRONOR) 0.35 MG tablet Take 1 tablet (0.35 mg total) by mouth daily. (Patient not taking: Reported on 12/21/2022) 84 tablet 3   pantoprazole (PROTONIX) 40 MG tablet Take 1 tablet (40 mg total) by mouth 2 (two) times daily. 60 tablet 0   SUMAtriptan (IMITREX) 50 MG tablet Take 1 tablet by mouth at start of headache.  May repeat in 2 hours if no relief.  Max 2 tabs/24 hr. (Patient not taking: Reported on 12/21/2022) 10 tablet 0   No current facility-administered  medications for this visit.    Allergies as of 02/24/2023   (No Known Allergies)    Family History  Problem Relation Age of Onset   Diabetes Mother    Hypertension Mother    Diabetes Father    Hypertension Father    Diabetes Paternal Aunt    Breast cancer Paternal Aunt    Diabetes Paternal Grandmother    Anesthesia problems Neg Hx    Hearing loss Neg Hx    Other Neg Hx    Stomach cancer Neg Hx    Rectal cancer Neg Hx    Esophageal cancer Neg Hx    Colon cancer Neg Hx     Social History   Socioeconomic History   Marital status: Married    Spouse name: Not on file   Number of children: 4   Years of education: Not on file   Highest education level: High school graduate  Occupational History   Occupation: bartender  Tobacco Use   Smoking status: Former    Current packs/day: 0.00    Types: Cigarettes    Quit date: 2005    Years since quitting: 19.9   Smokeless tobacco: Never  Vaping Use   Vaping status: Never Used  Substance and Sexual Activity   Alcohol use: Not Currently    Comment: every 8 days 5-8 drinks of wine and whiskey   Drug use: No   Sexual activity: Yes    Birth control/protection: None, Surgical  Other Topics Concern   Not on file  Social History Narrative   Right Handed    Drinks no caffeine    Social Determinants of Health   Financial Resource Strain: Not on file  Food Insecurity: No Food Insecurity (07/02/2022)   Hunger Vital Sign    Worried About Running Out of Food in the Last Year: Never true    Ran Out of Food in the Last Year: Never true  Transportation Needs: No Transportation Needs (07/02/2022)   PRAPARE - Administrator, Civil Service (Medical): No    Lack of Transportation (Non-Medical): No  Physical Activity: Not on file  Stress: Not on file  Social Connections: Not on file  Intimate Partner Violence: Not on file    Review of Systems:    Constitutional: No weight loss, fever or chills Cardiovascular: No chest  pain  Respiratory: No SOB  Gastrointestinal: See HPI and otherwise negative   Physical Exam:  Vital signs: BP 120/84 (BP Location: Left Arm, Patient Position: Sitting, Cuff Size: Normal)   Pulse 84   Ht 5\' 2"  (1.575 m)   Wt 168 lb (76.2 kg)   LMP 10/08/2011   SpO2 97%   BMI 30.73 kg/m   Constitutional:   Pleasant Hispanic  female appears to be in NAD, Well developed, Well nourished, alert and cooperative Respiratory: Respirations even and unlabored. Lungs clear to auscultation bilaterally.   No wheezes, crackles, or rhonchi.  Cardiovascular: Normal S1, S2. No MRG. Regular rate and rhythm. No peripheral edema, cyanosis or pallor.  Gastrointestinal:  Soft, nondistended, nontender. No rebound or guarding. Normal bowel sounds. No appreciable masses or hepatomegaly. Rectal:  Not performed.  Psychiatric: Demonstrates good judgement and reason without abnormal affect or behaviors.  RELEVANT LABS AND IMAGING: CBC    Component Value Date/Time   WBC 8.0 05/20/2022 1637   RBC 4.54 05/20/2022 1637   HGB 12.3 05/20/2022 1637   HGB 12.4 12/04/2021 1442   HCT 37.0 05/20/2022 1637   HCT 36.9 12/04/2021 1442   PLT 268 05/20/2022 1637   PLT 289 12/04/2021 1442   MCV 81.5 05/20/2022 1637   MCV 81 12/04/2021 1442   MCH 27.1 05/20/2022 1637   MCHC 33.2 05/20/2022 1637   RDW 13.2 05/20/2022 1637   RDW 14.3 12/04/2021 1442   LYMPHSABS 2.4 12/04/2021 1442   MONOABS 0.4 07/28/2021 1711   EOSABS 0.1 12/04/2021 1442   BASOSABS 0.0 12/04/2021 1442    CMP     Component Value Date/Time   NA 138 10/19/2022 1023   K 4.1 10/19/2022 1023   CL 102 10/19/2022 1023   CO2 25 10/19/2022 1023   GLUCOSE 95 10/19/2022 1023   GLUCOSE 100 (H) 05/20/2022 1637   BUN 15 10/19/2022 1023   CREATININE 0.65 10/19/2022 1023   CREATININE 0.71 07/06/2014 1153   CALCIUM 9.1 10/19/2022 1023   PROT 8.1 05/20/2022 1637   PROT 7.9 04/10/2022 0947   ALBUMIN 4.5 05/20/2022 1637   ALBUMIN 5.1 (H) 04/10/2022 0947    AST 28 05/20/2022 1637   ALT 13 05/20/2022 1637   ALKPHOS 59 05/20/2022 1637   BILITOT 0.7 05/20/2022 1637   BILITOT 0.3 04/10/2022 0947   GFRNONAA >60 05/20/2022 1637   GFRNONAA >89 07/06/2014 1153   GFRAA >60 09/20/2019 1602   GFRAA >89 07/06/2014 1153    Assessment: 1.  GERD: Controlled on Pantoprazole 40 every other day 2.  Hemangioma of the liver: Seen last on MRI in August, recommended repeat MRI in a year 3.  Fatty liver: Initially seen on ultrasound in 2022, not mentioned on CT or MRI that followed, patient has lost about 15 pounds over that time.  Plan: 1.  Briefly discussed fatty liver with the patient.  Encouraged her healthy diet as she is already doing. 2.  Patient is in recall for repeat MRI and LFTs in August of next year.  We discussed diagnosis of hemangioma and how this is completely benign.  Answered her questions. 3.  Refilled Pantoprazole 40 mg daily, 30-60 minutes before breakfast.  #30 with 11 refills. 4.  Patient to follow in clinic with [place] as needed or in 2 years for further refills.  Brooke Meeker, PA-C [facility] 02/24/2023, 1:35 PM  Cc: Marcine Matar, MD

## 2023-02-24 NOTE — Patient Instructions (Addendum)
We have sent the following medications to your pharmacy for you to pick up at your convenience: Pantoprazole    If your blood pressure at your visit was 140/90 or greater, please contact your primary care physician to follow up on this.  _______________________________________________________  If you are age 45 or older, your body mass index should be between 23-30. Your Body mass index is 30.73 kg/m. If this is out of the aforementioned range listed, please consider follow up with your Primary Care Provider.  If you are age 77 or younger, your body mass index should be between 19-25. Your Body mass index is 30.73 kg/m. If this is out of the aformentioned range listed, please consider follow up with your Primary Care Provider.   ________________________________________________________  The [facility] providers would like to encourage you to use [facility] to communicate with providers for non-urgent requests or questions.  Due to long hold times on the telephone, sending your provider a message by [facility] may be a faster and more efficient way to get a response.  Please allow 48 business hours for a response.  Please remember that this is for non-urgent requests.  _______________________________________________________   Thank you for entrusting me with your care and choosing [facility]  Hyacinth Meeker PA-C

## 2023-03-07 NOTE — Progress Notes (Signed)
Agree with assessment/plan.  Raj Florestine Carmical, MD [facility] [phone]  

## 2023-04-06 ENCOUNTER — Encounter: Payer: Self-pay | Admitting: Cardiology

## 2023-04-06 ENCOUNTER — Ambulatory Visit: Payer: Self-pay | Attending: Cardiology | Admitting: Cardiology

## 2023-04-06 VITALS — BP 132/78 | HR 66 | Ht 63.0 in | Wt 172.4 lb

## 2023-04-06 DIAGNOSIS — R7303 Prediabetes: Secondary | ICD-10-CM

## 2023-04-06 DIAGNOSIS — I10 Essential (primary) hypertension: Secondary | ICD-10-CM

## 2023-04-06 DIAGNOSIS — R002 Palpitations: Secondary | ICD-10-CM

## 2023-04-06 NOTE — Patient Instructions (Signed)
 Medication Instructions:  Your physician recommends that you continue on your current medications as directed. Please refer to the Current Medication list given to you today.  *If you need a refill on your cardiac medications before your next appointment, please call your pharmacy*  Follow-Up: At [facility], you and your health needs are our priority.  As part of our continuing mission to provide you with exceptional heart care, we have created designated Provider Care Teams.  These Care Teams include your primary Cardiologist (physician) and Advanced Practice Providers (APPs -  Physician Assistants and Nurse Practitioners) who all work together to provide you with the care you need, when you need it.  Your next appointment:   6 month(s)  Provider:   Thomasene Ripple, DO     Other Instructions:

## 2023-04-07 NOTE — Progress Notes (Signed)
 a Cardiology Office Note:    Date:  04/07/2023   ID:  Brooke, Lam 1980/10/21, MRN [id number]  PCP:  Vicci Barnie NOVAK, MD  Cardiologist:  Dub Huntsman, DO  Electrophysiologist:  None   Referring MD: Vicci Barnie NOVAK, MD    I am ok   History of Present Illness:    Brooke Lam is a 43 y.o. female with a hx of prediabetes, hyperlipidemia and hypertension here today for follow-up visit.  She was last seen in October of 2023 -  during that visit she was experiencing palpitation despite normal monitor. I wanted to start her on propanolol but she declined.   She also reports experiencing pain in her hands, particularly at night. The pain is described as sharp and is relieved by changing position. This has been ongoing for a couple of years and is thought to be related to her sleeping position. She also reports experiencing cold hands and feet.  The patient has previously seen a doctor for this issue, who suggested it might be related to poor circulation., but she describes the pain only in her upper extremities when she lies down and resolves once she wakes up and shakes her hands. However, the pain is not present in her lover extremities . It  is relieved by movement, which suggests it may be a nerve issue rather than a circulatory one. She has previously had injections for this issue, which give her relief for a short time.   In addition to these concerns, the patient also mentions potential symptoms of menopause, including changes in body temperature. She expresses interest in learning more about menopause and its effects on the heart.  Past Medical History:  Diagnosis Date   Anemia    Blood transfusion without reported diagnosis    Depression    Ectopic pregnancy    Hyperlipidemia    Hypertension    Preterm labor     Past Surgical History:  Procedure Laterality Date   ABDOMINAL HYSTERECTOMY     BREAST BIOPSY Right 2013   FA   CESAREAN SECTION      C/S x 4   UPPER GASTROINTESTINAL ENDOSCOPY  04/23/2020    Current Medications: Current Meds  Medication Sig   albuterol  (VENTOLIN  HFA) 108 (90 Base) MCG/ACT inhaler Inhale 2 puffs into the lungs every 6 (six) hours as needed for wheezing or shortness of breath.   hydrocortisone  (ANUSOL -HC) 2.5 % rectal cream Place 1 Application rectally 2 (two) times daily as needed for hemorrhoids or anal itching.   lubiprostone  (AMITIZA ) 24 MCG capsule Take 1 capsule (24 mcg total) by mouth 2 (two) times daily with a meal.   norethindrone  (MICRONOR ) 0.35 MG tablet Take 1 tablet (0.35 mg total) by mouth daily.   pantoprazole  (PROTONIX ) 40 MG tablet Take 1 tablet (40 mg total) by mouth daily.   SUMAtriptan  (IMITREX ) 50 MG tablet Take 1 tablet by mouth at start of headache.  May repeat in 2 hours if no relief.  Max 2 tabs/24 hr. (Patient taking differently: Take 1 tablet by mouth at start of headache.  May repeat in 2 hours if no relief.  Max 2 tabs/24 hr.)     Allergies:   Patient has no known allergies.   Social History   Socioeconomic History   Marital status: Married    Spouse name: Not on file   Number of children: 4   Years of education: Not on file   Highest education level: High school graduate  Occupational History   Occupation: bartender  Tobacco Use   Smoking status: Former    Current packs/day: 0.00    Types: Cigarettes    Quit date: 2005    Years since quitting: 20.0   Smokeless tobacco: Never  Vaping Use   Vaping status: Never Used  Substance and Sexual Activity   Alcohol use: Not Currently    Comment: every 8 days 5-8 drinks of wine and whiskey (occ)   Drug use: No   Sexual activity: Yes    Birth control/protection: None, Surgical  Other Topics Concern   Not on file  Social History Narrative   Right Handed    Drinks no caffeine    Social Drivers of Corporate Investment Banker Strain: Not on file  Food Insecurity: No Food Insecurity (07/02/2022)   Hunger Vital Sign     Worried About Running Out of Food in the Last Year: Never true    Ran Out of Food in the Last Year: Never true  Transportation Needs: No Transportation Needs (07/02/2022)   PRAPARE - Administrator, Civil Service (Medical): No    Lack of Transportation (Non-Medical): No  Physical Activity: Not on file  Stress: Not on file  Social Connections: Not on file     Family History: The patient's family history includes Breast cancer in her paternal aunt; Diabetes in her father, mother, paternal aunt, and paternal grandmother; Hypertension in her father and mother. There is no history of Anesthesia problems, Hearing loss, Other, Stomach cancer, Rectal cancer, Esophageal cancer, or Colon cancer.  ROS:   Review of Systems  Constitution: Negative for decreased appetite, fever and weight gain.  HENT: Negative for congestion, ear discharge, hoarse voice and sore throat.   Eyes: Negative for discharge, redness, vision loss in right eye and visual halos.  Cardiovascular: Negative for chest pain, dyspnea on exertion, leg swelling, orthopnea and palpitations.  Respiratory: Negative for cough, hemoptysis, shortness of breath and snoring.   Endocrine: Negative for heat intolerance and polyphagia.  Hematologic/Lymphatic: Negative for bleeding problem. Does not bruise/bleed easily.  Skin: Negative for flushing, nail changes, rash and suspicious lesions.  Musculoskeletal: Negative for arthritis, joint pain, muscle cramps, myalgias, neck pain and stiffness.  Gastrointestinal: Negative for abdominal pain, bowel incontinence, diarrhea and excessive appetite.  Genitourinary: Negative for decreased libido, genital sores and incomplete emptying.  Neurological: Negative for brief paralysis, focal weakness, headaches and loss of balance.  Psychiatric/Behavioral: Negative for altered mental status, depression and suicidal ideas.  Allergic/Immunologic: Negative for HIV exposure and persistent infections.     EKGs/Labs/Other Studies Reviewed:    The following studies were reviewed today:   EKG:  The ekg ordered today demonstrates sinus rhythm.   Recent Labs: 05/20/2022: ALT 13; Hemoglobin 12.3; Platelets 268 10/19/2022: BUN 15; Creatinine, Ser 0.65; Potassium 4.1; Sodium 138  Recent Lipid Panel    Component Value Date/Time   CHOL 144 04/10/2022 0947   TRIG 124 04/10/2022 0947   HDL 36 (L) 04/10/2022 0947   CHOLHDL 4.0 04/10/2022 0947   LDLCALC 86 04/10/2022 0947    Physical Exam:    VS:  BP 132/78   Pulse 66   Ht 5' 3 (1.6 m)   Wt 172 lb 6.4 oz (78.2 kg)   LMP 10/08/2011   SpO2 98%   BMI 30.54 kg/m     Wt Readings from Last 3 Encounters:  04/06/23 172 lb 6.4 oz (78.2 kg)  02/24/23 168 lb (76.2 kg)  10/19/22 165  lb (74.8 kg)     GEN: Well nourished, well developed in no acute distress HEENT: Normal NECK: No JVD; No carotid bruits LYMPHATICS: No lymphadenopathy CARDIAC: S1S2 noted,RRR, no murmurs, rubs, gallops RESPIRATORY:  Clear to auscultation without rales, wheezing or rhonchi  ABDOMEN: Soft, non-tender, non-distended, +bowel sounds, no guarding. EXTREMITIES: No edema, No cyanosis, no clubbing MUSCULOSKELETAL:  No deformity  SKIN: Warm and dry NEUROLOGIC:  Alert and oriented x 3, non-focal PSYCHIATRIC:  Normal affect, good insight  ASSESSMENT:    1. Palpitations   2. Prediabetes   3. Primary hypertension    PLAN:      Hypertension - Elevated blood pressure readings at home and in clinic. But she prefers not to be started on antihypertensive for now. -Will send a blood pressure cuff RX to the  pharmacy for patient to monitor blood pressure at home.  Musculoskeletal Pain Pain in the arm when sleeping in a certain position. Likely due to muscle strain. -Advised to change sleeping position to avoid straining the muscle.  Neuropathic Pain Experiencing nocturnal hand pain, likely due to nerve compression during sleep. -Advised to change sleeping position  to avoid nerve compression.  Palpitations History of palpitations and fast heartbeat, with occasional episodes currently. Previous monitoring showed some skipped beats. -Plan to reassess in 9 months or sooner if symptoms worsen.  2.  Prediabetes-continue her metformin .  3.  Hyperlipidemia - she has stopped the statin , LDL 1 year ago at target  4.  The patient understands the need to lose weight with diet and exercise. We have discussed specific strategies for this.  The patient is in agreement with the above plan. The patient left the office in stable condition.  The patient will follow up in   Medication Adjustments/Labs and Tests Ordered: Current medicines are reviewed at length with the patient today.  Concerns regarding medicines are outlined above.  Orders Placed This Encounter  Procedures   EKG 12-Lead   No orders of the defined types were placed in this encounter.   Patient Instructions  Medication Instructions:  Your physician recommends that you continue on your current medications as directed. Please refer to the Current Medication list given to you today.  *If you need a refill on your cardiac medications before your next appointment, please call your pharmacy*  Follow-Up: At [facility], you and your health needs are our priority.  As part of our continuing mission to provide you with exceptional heart care, we have created designated Provider Care Teams.  These Care Teams include your primary Cardiologist (physician) and Advanced Practice Providers (APPs -  Physician Assistants and Nurse Practitioners) who all work together to provide you with the care you need, when you need it.  Your next appointment:   6 month(s)  Provider:   Colston Pyle, DO     Other Instructions     Adopting a Healthy Lifestyle.  Know what a healthy weight is for you (roughly BMI <25) and aim to maintain this   Aim for 7+ servings of fruits and vegetables daily   65-80+  fluid ounces of water or unsweet tea for healthy kidneys   Limit to max 1 drink of alcohol per day; avoid smoking/tobacco   Limit animal fats in diet for cholesterol and heart health - choose grass fed whenever available   Avoid highly processed foods, and foods high in saturated/trans fats   Aim for low stress - take time to unwind and care for your mental health  Aim for 150 min of moderate intensity exercise weekly for heart health, and weights twice weekly for bone health   Aim for 7-9 hours of sleep daily   When it comes to diets, agreement about the perfect plan isnt easy to find, even among the experts. Experts at the [facility] [employer] developed an idea known as the Healthy Eating Plate. Just imagine a plate divided into logical, healthy portions.   The emphasis is on diet quality:   Load up on vegetables and fruits - one-half of your plate: Aim for color and variety, and remember that potatoes dont count.   Go for whole grains - one-quarter of your plate: Whole wheat, barley, wheat berries, quinoa, oats, brown rice, and foods made with them. If you want pasta, go with whole wheat pasta.   Protein power - one-quarter of your plate: Fish, chicken, beans, and nuts are all healthy, versatile protein sources. Limit red meat.   The diet, however, does go beyond the plate, offering a few other suggestions.   Use healthy plant oils, such as olive, canola, soy, corn, sunflower and peanut. Check the labels, and avoid partially hydrogenated oil, which have unhealthy trans fats.   If youre thirsty, drink water. Coffee and tea are good in moderation, but skip sugary drinks and limit milk and dairy products to one or two daily servings.   The type of carbohydrate in the diet is more important than the amount. Some sources of carbohydrates, such as vegetables, fruits, whole grains, and beans-are healthier than others.   Finally, stay active  Signed, Dub Huntsman, DO   04/07/2023 8:18 PM    [facility]

## 2023-04-12 ENCOUNTER — Ambulatory Visit: Payer: Self-pay | Admitting: Obstetrics and Gynecology

## 2023-04-22 ENCOUNTER — Ambulatory Visit: Payer: Self-pay | Attending: Internal Medicine | Admitting: Internal Medicine

## 2023-04-22 ENCOUNTER — Encounter: Payer: Self-pay | Admitting: Internal Medicine

## 2023-04-22 VITALS — BP 126/81 | HR 83 | Ht 63.0 in | Wt 172.2 lb

## 2023-04-22 DIAGNOSIS — E66811 Obesity, class 1: Secondary | ICD-10-CM

## 2023-04-22 DIAGNOSIS — Z2821 Immunization not carried out because of patient refusal: Secondary | ICD-10-CM

## 2023-04-22 DIAGNOSIS — R7303 Prediabetes: Secondary | ICD-10-CM

## 2023-04-22 DIAGNOSIS — I10 Essential (primary) hypertension: Secondary | ICD-10-CM

## 2023-04-22 DIAGNOSIS — R1900 Intra-abdominal and pelvic swelling, mass and lump, unspecified site: Secondary | ICD-10-CM

## 2023-04-22 DIAGNOSIS — R55 Syncope and collapse: Secondary | ICD-10-CM

## 2023-04-22 NOTE — Progress Notes (Signed)
Patient ID: Brooke Lam, female    DOB: June 19, 1980  MRN: [id number]  CC: Medical Management of Chronic Issues   Subjective: Brooke Lam is a 43 y.o. female who presents for chronic ds management. Her concerns today include:  Pt with hx of hyperTG, PreDM, abdominal pannus, palpitations (normal echo and Holter. Declined nightly propranolol by cardiology), right ovarian lesion (followed by GYN Dr. Clearance Coots 3/23 and 01/13/2022. Endometrioma versus complicated hemorrhagic cyst. Saw Dr. Vergie Living 07/2022.  Endometrioma.  Follow-up ultrasounds being done), chronic left upper quadrant pain (followed by GI Dr. Orvan Falconer), meralgia paresthetica LL limb, CTS RT.   AMN Language interpreter used during this encounter. #Juan [id number]  Endometrioma:  had repeat pelvic U/C 01/2023.  Showed stable 5.8 cm RT complex cystic lesion w/in RT ovary compatible with a benign endometrioma.  Plans to schedule f/u with GYN.  Palpitations:  saw cardiologist Dr. Tawanna Cooler in f/u this mth.  Found to have BP 132/78.  Pt declined starting med.  Checked BP since then at [facility] several times.  Gives readings 126/90, 134/95, 128/92, 118/90. Has been limiting salt for past 10 days.  Resumed exercise 3-4x/wk for 1-2 hrs.    Reports 3 episodes in past mth where she stands up, vision gets blurry, feels like she is going to pass out.  Has to lay down. Feels like she needs air so she takes deep breaths. No palpitations.  Endorses dizziness.  No polyuria/dipsia. No dizziness when rolling over in bed.  Drinks 3-4 bottles a day.   Episodes occur at home but yesterday she was at dentist  HM: declines flu shot. Reports having Tdapt 3 mths ago at HD.   Patient Active Problem List   Diagnosis Date Noted   Endometrioma of ovary 08/03/2022   Cervix still in place 06/02/2022   Language barrier 06/01/2022   History of ovarian cyst 06/01/2022   PCB (post coital bleeding) 06/01/2022   Empty sella (HCC) 04/10/2022   Bilateral  carpal tunnel syndrome 04/10/2022   Cephalalgia 04/10/2022   Mixed hyperlipidemia 07/15/2021   Prediabetes 07/15/2021   Fibroadenoma of breast, right 05/11/2019   Dyspareunia 11/12/2014   Dysuria 09/17/2011     Current Outpatient Medications on File Prior to Visit  Medication Sig Dispense Refill   albuterol (VENTOLIN HFA) 108 (90 Base) MCG/ACT inhaler Inhale 2 puffs into the lungs every 6 (six) hours as needed for wheezing or shortness of breath. 8 g 2   hydrocortisone (ANUSOL-HC) 2.5 % rectal cream Place 1 Application rectally 2 (two) times daily as needed for hemorrhoids or anal itching. 30 g 1   lubiprostone (AMITIZA) 24 MCG capsule Take 1 capsule (24 mcg total) by mouth 2 (two) times daily with a meal. 60 capsule 3   norethindrone (MICRONOR) 0.35 MG tablet Take 1 tablet (0.35 mg total) by mouth daily. 84 tablet 3   SUMAtriptan (IMITREX) 50 MG tablet Take 1 tablet by mouth at start of headache.  May repeat in 2 hours if no relief.  Max 2 tabs/24 hr. (Patient taking differently: Take 1 tablet by mouth at start of headache.  May repeat in 2 hours if no relief.  Max 2 tabs/24 hr.) 10 tablet 0   pantoprazole (PROTONIX) 40 MG tablet Take 1 tablet (40 mg total) by mouth daily. (Patient not taking: Reported on 04/22/2023) 30 tablet 11   No current facility-administered medications on file prior to visit.    No Known Allergies  Social History   Socioeconomic History  Marital status: Married    Spouse name: Not on file   Number of children: 4   Years of education: Not on file   Highest education level: High school graduate  Occupational History   Occupation: bartender  Tobacco Use   Smoking status: Former    Current packs/day: 0.00    Types: Cigarettes    Quit date: 2005    Years since quitting: 20.0   Smokeless tobacco: Never  Vaping Use   Vaping status: Never Used  Substance and Sexual Activity   Alcohol use: Not Currently    Comment: every 8 days 5-8 drinks of wine and  whiskey (occ)   Drug use: No   Sexual activity: Yes    Birth control/protection: None, Surgical  Other Topics Concern   Not on file  Social History Narrative   Right Handed    Drinks no caffeine    Social Drivers of Health   Financial Resource Strain: Low Risk  (04/22/2023)   Overall Financial Resource Strain (CARDIA)    Difficulty of Paying Living Expenses: Not hard at all  Food Insecurity: No Food Insecurity (04/22/2023)   Hunger Vital Sign    Worried About Running Out of Food in the Last Year: Never true    Ran Out of Food in the Last Year: Never true  Transportation Needs: No Transportation Needs (04/22/2023)   PRAPARE - Administrator, Civil Service (Medical): No    Lack of Transportation (Non-Medical): No  Physical Activity: Inactive (04/22/2023)   Exercise Vital Sign    Days of Exercise per Week: 0 days    Minutes of Exercise per Session: 0 min  Stress: No Stress Concern Present (04/22/2023)   [employer] of Occupational Health - Occupational Stress Questionnaire    Feeling of Stress : Not at all  Social Connections: Moderately Integrated (04/22/2023)   Social Connection and Isolation Panel [NHANES]    Frequency of Communication with Friends and Family: More than three times a week    Frequency of Social Gatherings with Friends and Family: Twice a week    Attends Religious Services: More than 4 times per year    Active Member of [employer] or Organizations: Yes    Attends Engineer, structural: More than 4 times per year    Marital Status: Divorced  Catering manager Violence: Not At Risk (04/22/2023)   Humiliation, Afraid, Rape, and Kick questionnaire    Fear of Current or Ex-Partner: No    Emotionally Abused: No    Physically Abused: No    Sexually Abused: No    Family History  Problem Relation Age of Onset   Diabetes Mother    Hypertension Mother    Diabetes Father    Hypertension Father    Diabetes Paternal Aunt    Breast cancer Paternal  Aunt    Diabetes Paternal Grandmother    Anesthesia problems Neg Hx    Hearing loss Neg Hx    Other Neg Hx    Stomach cancer Neg Hx    Rectal cancer Neg Hx    Esophageal cancer Neg Hx    Colon cancer Neg Hx     Past Surgical History:  Procedure Laterality Date   ABDOMINAL HYSTERECTOMY     BREAST BIOPSY Right 2013   FA   CESAREAN SECTION     C/S x 4   UPPER GASTROINTESTINAL ENDOSCOPY  04/23/2020    ROS: Review of Systems Negative except as stated above  PHYSICAL EXAM: BP  126/81 (BP Location: Left Arm, Patient Position: Sitting, Cuff Size: Normal)   Pulse 83   Ht 5\' 3"  (1.6 m)   Wt 172 lb 3.2 oz (78.1 kg)   LMP 10/08/2011   SpO2 100%   BMI 30.50 kg/m   Wt Readings from Last 3 Encounters:  04/22/23 172 lb 3.2 oz (78.1 kg)  04/06/23 172 lb 6.4 oz (78.2 kg)  02/24/23 168 lb (76.2 kg)    Physical Exam Sitting: BP 120/84, P80 Standing: BP 127/89, P90 General appearance - alert, well appearing, middle-age Hispanic female and in no distress Mental status - normal mood, behavior, speech, dress, motor activity, and thought processes Neck:  no carotid bruits Mouth - mucous membranes moist, pharynx normal without lesions Chest: CTA BL CVS:  RRR, no murmurs heard Neurological - cranial nerves II through XII intact, normal muscle tone, no tremors, strength 5/5, Romberg sign negative, normal gait and station Extremities - peripheral pulses normal, no pedal edema, no clubbing or cyanosis      Latest Ref Rng & Units 10/19/2022   10:23 AM 05/20/2022    4:37 PM 04/10/2022    9:47 AM  CMP  Glucose 70 - 99 mg/dL 95  161    BUN 6 - 24 mg/dL 15  17    Creatinine 0.96 - 1.00 mg/dL 0.45  4.09    Sodium 811 - 144 mmol/L 138  135    Potassium 3.5 - 5.2 mmol/L 4.1  3.4    Chloride 96 - 106 mmol/L 102  100    CO2 20 - 29 mmol/L 25  25    Calcium 8.7 - 10.2 mg/dL 9.1  9.1    Total Protein 6.5 - 8.1 g/dL  8.1  7.9   Total Bilirubin 0.3 - 1.2 mg/dL  0.7  0.3   Alkaline Phos 38 - 126  U/L  59  77   AST 15 - 41 U/L  28  21   ALT 0 - 44 U/L  13  8    Lipid Panel     Component Value Date/Time   CHOL 144 04/10/2022 0947   TRIG 124 04/10/2022 0947   HDL 36 (L) 04/10/2022 0947   CHOLHDL 4.0 04/10/2022 0947   LDLCALC 86 04/10/2022 0947    CBC    Component Value Date/Time   WBC 8.0 05/20/2022 1637   RBC 4.54 05/20/2022 1637   HGB 12.3 05/20/2022 1637   HGB 12.4 12/04/2021 1442   HCT 37.0 05/20/2022 1637   HCT 36.9 12/04/2021 1442   PLT 268 05/20/2022 1637   PLT 289 12/04/2021 1442   MCV 81.5 05/20/2022 1637   MCV 81 12/04/2021 1442   MCH 27.1 05/20/2022 1637   MCHC 33.2 05/20/2022 1637   RDW 13.2 05/20/2022 1637   RDW 14.3 12/04/2021 1442   LYMPHSABS 2.4 12/04/2021 1442   MONOABS 0.4 07/28/2021 1711   EOSABS 0.1 12/04/2021 1442   BASOSABS 0.0 12/04/2021 1442    ASSESSMENT AND PLAN: 1. Essential hypertension (Primary) Advised patient that all of her ambulatory blood pressure readings showed elevation in diastolic blood pressure readings.  Today's reading also elevated.  Patient would like to work on her eating habits a bit more before having to be placed on medication.  We will have her see our clinical follow-up as nurse only visit in a few wks for recheck.  If BP still greater than 130/80, we will start her on amlodipine 5 mg daily. - Recheck vitals  2. Prediabetes Encouraged  her to continue trying to eat healthy.  Printed information given.  Continue regular exercise. - Hemoglobin A1c  3. Obesity (BMI 30.0-34.9) See # 2 above - Lipid panel  4. Near syncope Episodes sound vasovagal.  Discussed importance of staying hydrated and going slow with position changes.  Advised to drink 4 to 8 glasses of water daily. - CBC - Comprehensive metabolic panel  5. Pelvic mass Stable.  Followed by gynecology.  6. Influenza vaccination declined  Patient was given the opportunity to ask questions.  Patient verbalized understanding of the plan and was able to  repeat key elements of the plan.   This documentation was completed using Paediatric nurse.  Any transcriptional errors are unintentional.  Orders Placed This Encounter  Procedures   CBC   Comprehensive metabolic panel   Hemoglobin A1c   Lipid panel   Recheck vitals     Requested Prescriptions    No prescriptions requested or ordered in this encounter    Return in about 4 months (around 08/20/2023) for RN visit in 2 wks for BP check.  Jonah Blue, MD, FACP

## 2023-04-22 NOTE — Progress Notes (Signed)
Patient has been experiencing blurry vision and fatigue.

## 2023-04-22 NOTE — Patient Instructions (Signed)
Plan de alimentacin DASH DASH Eating Plan DASH es la sigla en ingls de "Enfoques alimentarios para detener la hipertensin" (Dietary Approaches to Stop Hypertension). El plan de alimentacin DASH ha demostrado: Bajar la presin arterial alta (hipertensin). Reducir el riesgo de diabetes tipo 2, enfermedad cardaca y accidente cerebrovascular. Ayudar a perder peso. Consejos para seguir Consulting civil engineer las etiquetas de los alimentos Verifique la cantidad de sal (sodio) por porcin en las etiquetas de los alimentos. Elija alimentos con menos del 5 por ciento del Licensed conveyancer diario (VD) de sodio. En general, los alimentos con menos de 300 miligramos (mg) de sodio por porcin se encuadran dentro de este plan alimentario. Para encontrar cereales integrales, busque la palabra "integral" como primera palabra en la lista de ingredientes. Al ir de compras Compre productos en los que en su etiqueta diga: "bajo contenido de sodio" o "sin sal agregada". Compre alimentos frescos. Evite los alimentos enlatados y comidas precocidas o congeladas. Al cocinar Trate de no agregar sal cuando cocine. Use hierbas o aderezos sin sal, en lugar de sal de mesa o sal marina. Consulte al mdico o farmacutico antes de usar sustitutos de la sal. No fra los alimentos. A la hora de cocinar los alimentos, opte por hornearlos, hervirlos, grillarlos, asarlos al horno o a la parrilla. Cocine con aceites que sean buenos para el corazn. Estos incluyen el aceite de [place], canola, [place], soja y [place]. Planificacin de las comidas  Siga una dieta equilibrada. Esto debe incluir lo siguiente: 4 o ms porciones de frutas y 4 o ms porciones de Warehouse manager. Trate de que medio plato de cada comida sea de frutas y verduras. De 6 a 8 porciones de Ramona [employer] [place] [employer]. 6 o menos porciones de carne [place], ave o pescado por [employer]. 1 onza es 1 porcin. Una porcin de 3 onzas (85 g) de carne tiene casi el mismo tamao que la  palma de la [place]. Un huevo es 1 onza (28 g). De 2 a 3 porciones de productos lcteos descremados por da. Una porcin es 1 taza (237 ml). 1 porcin de frutos secos, semillas o frijoles 5 veces por semana. De 2 a 3 porciones de grasas cardiosaludables. Las grasas saludables llamadas cidos grasos omega-3 se encuentran en alimentos como las nueces, las semillas de [place], las leches fortificadas y [place]. Estas grasas tambin se encuentran en los pescados de agua fra, como la sardina, el salmn y la caballa. Limite la cantidad que consume de: Alimentos enlatados o envasados. Alimentos con alto contenido de grasa trans, como alimentos fritos. Alimentos con alto contenido de grasa saturada, como carne con grasa. Postres y otros dulces, bebidas azucaradas y otros alimentos con azcar agregada. Productos lcteos enteros. No le agregue sal a los alimentos antes de probarlos. No coma ms de 4 yemas de huevo por semana. Trate de comer al menos 2 comidas vegetarianas por semana. Consuma ms comida casera y menos de restaurante, de bares y comida rpida. Estilo de vida Cuando coma en un restaurante, pida que preparen su comida con menos sal, o bien, sin nada de sal. Si bebe alcohol: Limite la cantidad que bebe a lo siguiente: De 0 a 1 medida al da si es [place]. De 0 a 2 medidas al da si es varn. Sepa cunta cantidad de alcohol hay en las bebidas que toma. En los [address], una medida es una botella de cerveza de 12 oz (355 ml), un vaso de vino de 5 oz (148 ml) o  un vaso de una bebida alcohlica de alta graduacin de 1 oz (44 ml). Informacin general Evite ingerir ms de 2,300 mg de sal por da. Si tiene hipertensin, es posible que necesite reducir la ingesta de sodio a 1,500 mg por da. Trabaje con el mdico para [employer] en un peso saludable o para perder peso. Pregunte cul es el rango de peso recomendable para usted. La DIRECTV de la semana, realice al menos 30 minutos de  ejercicio que haga que se acelere su corazn. Estos pueden incluir caminar, nadar o andar en bicicleta. Trabaje con su mdico o nutricionista para ajustar su plan alimentario a sus necesidades calricas especficas. Qu alimentos debo comer? Frutas Todas las frutas frescas, congeladas o secas. Frutas enlatadas que estn en su jugo natural y que no tengan azcar agregada. Verduras Verduras frescas o congeladas crudas, cocidas al vapor, asadas o grilladas. Jugos de tomate y verduras con bajo contenido de sodio o reducidos en sodio. Salsa y pasta de tomate con bajo contenido de sodio o reducidas en sodio. Verduras enlatadas con bajo contenido de sodio o reducidas en sodio. Granos Pan de salvado o integral. Pasta de salvado o integral. Arroz integral. Avena. Quinua. Trigo burgol. Cereales integrales y con bajo contenido de [place]. Pan pita. Galletitas de [place] con bajo contenido de [place] (the [place] of 60 deg [place]) y [place]. Tortillas de [place] integral. Carnes y otras protenas Pollo o pavo sin piel. Carne de pollo o de [place]. Cerdo desgrasado. Pescado y [employer]. Claras de huevo. Porotos, guisantes o lentejas secos. Frutos secos, mantequilla de frutos secos y semillas sin sal. Frijoles enlatados sin sal. Cortes de carne vacuna magra, desgrasada. Carne precocida o curada magra y baja en sodio, como embutidos o panes de carne. Lcteos Leche descremada (1 %) o descremada (desnatada). Quesos reducidos en grasa, con bajo contenido de grasa o descremados. Queso blanco o ricota sin grasa, con bajo contenido de [place]. Yogur semidescremado o descremado. Queso con bajo contenido de [place] y [place]. Grasas y [employer] untables que no contengan grasas trans. Aceite vegetal. Jerolyn Shin y aderezos para ensaladas livianos, reducidos en grasa o con bajo contenido de grasas (reducidos en sodio). Aceite de canola, crtamo, oliva, aguacate, soja y [place]. [place]. Alios y condimentos Hierbas. Especias. Mezclas de condimentos sin  sal. Otros alimentos Palomitas de maz y pretzels sin sal. Dulces con bajo contenido de grasas. Es posible que los productos que se enumeran ms arriba no sean todos los alimentos y las bebidas que puede consumir. Consulte a un nutricionista para obtener ms informacin. Qu alimentos debo evitar? Nils Pyle Fruta enlatada en almbar liviano o espeso. Frutas cocidas en aceite. Frutas con salsa de crema o mantequilla. Verduras Verduras con crema o fritas. Verduras en salsa de [place]. Verduras enlatadas regulares que no sean con bajo contenido de sodio o reducidas en sodio. Pasta y salsa de tomates enlatadas regulares que no sean con bajo contenido de sodio o reducidas en sodio. Jugos de tomate y de verduras regulares que no sean con bajo contenido de sodio o reducidos en sodio. Pepinillos. Aceitunas. Granos Productos de panificacin hechos con grasa, como medialunas, magdalenas y algunos panes. Comidas con arroz o pasta seca listas para usar. Carnes y [address] de carne con alto contenido de Holiday representative. Costillas. Carne frita. [place]. [place], salame y otras carnes precocidas o curadas, como embutidos o panes de carne, que no sean magros y que no sean con bajo contenido de [place]. Grasa de la espalda del cerdo (panceta). Salchicha de cerdo. Frutos secos  y semillas con sal. Frijoles enlatados con sal agregada. Pescado enlatado o ahumado. Huevos enteros o yemas. Pollo o pavo con piel. Lcteos Leche entera o al 2 %, crema y [address] y mitad crema. Queso crema entero o con toda su grasa. Yogur entero o endulzado. Quesos con toda su grasa. Sustitutos de cremas no lcteas. Coberturas batidas. Quesos para untar y quesos procesados. Grasas y [employer]. Margarina en barra. Manteca de cerdo. Materia grasa. Mantequilla clarificada. Grasa de tocino. Aceites tropicales como aceite de coco, palmiste o palma. Alios y condimentos Sal de cebolla, sal de ajo, sal condimentada, sal de mesa y sal  marina. Salsa [place]. Salsa trtara. Salsa barbacoa. Salsa teriyaki. Salsa de soja, incluso la que tiene contenido reducido de [place]. Salsa de carne. Salsas en lata y envasadas. Salsa de pescado. Salsa de [place]. Salsa rosada. Rbanos picantes comprados en tiendas. [place]. [place]aza. Saborizantes y tiernizantes para carne. Caldo en cubitos. Salsas picantes. Adobos preelaborados o envasados. Aderezos para tacos preelaborados o envasados. Salsas. Aderezos comunes para ensalada. Otros alimentos Palomitas de maz y pretzels con sal. Es posible que los productos que se enumeran ms arriba no sean todos los alimentos y las bebidas que Personnel officer. Consulte a un nutricionista para obtener ms informacin. Dnde obtener ms informacin [employer]'s, Lung, and Blood Institute (Instituto Nacional del [place], los Pulmones y la [place], [place]): BuffaloDryCleaner.gl American Heart Association (Asociacin Estadounidense del [place], [place]): heart.org Academy of Nutrition and Dietetics (Academia de Nutricin y Pension scheme manager): eatright.org National Kidney Foundation (NKF) (Fundacin Nacional del Rin): kidney.org Esta informacin no tiene Theme park manager el consejo del mdico. Asegrese de hacerle al mdico cualquier pregunta que tenga. Document Revised: 04/17/2022 Document Reviewed: 04/17/2022 Elsevier Patient Education  2024 [employer].

## 2023-04-23 LAB — COMPREHENSIVE METABOLIC PANEL
ALT: 7 [IU]/L (ref 0–32)
AST: 17 [IU]/L (ref 0–40)
Albumin: 4.8 g/dL (ref 3.9–4.9)
Alkaline Phosphatase: 83 [IU]/L (ref 44–121)
BUN/Creatinine Ratio: 30 — ABNORMAL HIGH (ref 9–23)
BUN: 24 mg/dL (ref 6–24)
Bilirubin Total: 0.2 mg/dL (ref 0.0–1.2)
CO2: 23 mmol/L (ref 20–29)
Calcium: 10 mg/dL (ref 8.7–10.2)
Chloride: 100 mmol/L (ref 96–106)
Creatinine, Ser: 0.8 mg/dL (ref 0.57–1.00)
Globulin, Total: 2.8 g/dL (ref 1.5–4.5)
Glucose: 110 mg/dL — ABNORMAL HIGH (ref 70–99)
Potassium: 4.7 mmol/L (ref 3.5–5.2)
Sodium: 138 mmol/L (ref 134–144)
Total Protein: 7.6 g/dL (ref 6.0–8.5)
eGFR: 93 mL/min/{1.73_m2} (ref >59)

## 2023-04-23 LAB — LIPID PANEL
Chol/HDL Ratio: 5.1 {ratio} — ABNORMAL HIGH (ref 0.0–4.4)
Cholesterol, Total: 236 mg/dL — ABNORMAL HIGH (ref 100–199)
HDL: 46 mg/dL (ref >39)
LDL Chol Calc (NIH): 158 mg/dL — ABNORMAL HIGH (ref 0–99)
Triglycerides: 178 mg/dL — ABNORMAL HIGH (ref 0–149)
VLDL Cholesterol Cal: 32 mg/dL (ref 5–40)

## 2023-04-23 LAB — CBC
Hematocrit: 40.7 % (ref 34.0–46.6)
Hemoglobin: 12.9 g/dL (ref 11.1–15.9)
MCH: 27 pg (ref 26.6–33.0)
MCHC: 31.7 g/dL (ref 31.5–35.7)
MCV: 85 fL (ref 79–97)
Platelets: 264 10*3/uL (ref 150–450)
RBC: 4.77 x10E6/uL (ref 3.77–5.28)
RDW: 13.2 % (ref 11.7–15.4)
WBC: 7 10*3/uL (ref 3.4–10.8)

## 2023-04-23 LAB — HEMOGLOBIN A1C
Est. average glucose Bld gHb Est-mCnc: 123 mg/dL
Hgb A1c MFr Bld: 5.9 % — ABNORMAL HIGH (ref 4.8–5.6)

## 2023-04-30 ENCOUNTER — Other Ambulatory Visit: Payer: Self-pay | Admitting: Cardiology

## 2023-04-30 LAB — LIPID PANEL
Cholesterol: 178
HDL: 41
LDL: 115
Triglycerides: 109 (ref 40–160)

## 2023-04-30 LAB — POCT ABI - SCREENING FOR PILOT NO CHARGE
Left ABI: 1.17
Right ABI: 1.19

## 2023-04-30 LAB — HEMOGLOBIN A1C: Hemoglobin A1C: 6.1

## 2023-04-30 NOTE — Progress Notes (Signed)
 No pressing issues at this time.

## 2023-05-01 LAB — LIPID PANEL W/O CHOL/HDL RATIO
Cholesterol, Total: 210 mg/dL — ABNORMAL HIGH (ref 100–199)
HDL: 40 mg/dL (ref >39)
LDL Chol Calc (NIH): 153 mg/dL — ABNORMAL HIGH (ref 0–99)
Triglycerides: 94 mg/dL (ref 0–149)
VLDL Cholesterol Cal: 17 mg/dL (ref 5–40)

## 2023-05-06 ENCOUNTER — Other Ambulatory Visit: Payer: Self-pay

## 2023-05-06 ENCOUNTER — Ambulatory Visit: Payer: Self-pay | Attending: Internal Medicine

## 2023-05-06 VITALS — BP 118/85 | HR 78

## 2023-05-06 DIAGNOSIS — I10 Essential (primary) hypertension: Secondary | ICD-10-CM

## 2023-05-06 MED ORDER — AMLODIPINE BESYLATE 5 MG PO TABS
5.0000 mg | ORAL_TABLET | Freq: Every day | ORAL | 1 refills | Status: DC
  Filled 2023-05-06: qty 30, 30d supply, fill #0
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
## 2023-05-06 NOTE — Progress Notes (Signed)
   Blood Pressure Recheck Visit  Name: Yancy Hascall MRN: [id number] Date of Birth: [birth year]  Ruie Sendejo presents today for Blood Pressure recheck with clinical support staff.  Order for BP recheck by Dr. Timoteo Expose, ordered on 05/06/2023.   BP Readings from Last 3 Encounters:  05/06/23 118/85  04/30/23 125/82  04/22/23 126/81    Current Outpatient Medications  Medication Sig Dispense Refill   amLODipine (NORVASC) 5 MG tablet Take 1 tablet (5 mg total) by mouth daily. 30 tablet 1   albuterol (VENTOLIN HFA) 108 (90 Base) MCG/ACT inhaler Inhale 2 puffs into the lungs every 6 (six) hours as needed for wheezing or shortness of breath. 8 g 2   hydrocortisone (ANUSOL-HC) 2.5 % rectal cream Place 1 Application rectally 2 (two) times daily as needed for hemorrhoids or anal itching. 30 g 1   lubiprostone (AMITIZA) 24 MCG capsule Take 1 capsule (24 mcg total) by mouth 2 (two) times daily with a meal. 60 capsule 3   norethindrone (MICRONOR) 0.35 MG tablet Take 1 tablet (0.35 mg total) by mouth daily. 84 tablet 3   pantoprazole (PROTONIX) 40 MG tablet Take 1 tablet (40 mg total) by mouth daily. (Patient not taking: Reported on 04/22/2023) 30 tablet 11   SUMAtriptan (IMITREX) 50 MG tablet Take 1 tablet by mouth at start of headache.  May repeat in 2 hours if no relief.  Max 2 tabs/24 hr. (Patient taking differently: Take 1 tablet by mouth at start of headache.  May repeat in 2 hours if no relief.  Max 2 tabs/24 hr.) 10 tablet 0   No current facility-administered medications for this visit.    Hypertensive Medication Review:   Patient Started on Hypertensive today. Norvasc 5 MG  Documentation of any medication adherence discrepancies: none  Provider Recommendation:  Spoke to Dr. Laural Benes and they stated:  If BP still > 130/80, start BP med Norvasc 5 mg daily.   Patient has been scheduled to follow up with  Provider in 3 Weeks   Patient has been given provider's  recommendations and does not have any questions or concerns at this time. Patient will contact the office for any future questions or concerns.

## 2023-05-07 ENCOUNTER — Encounter: Payer: Self-pay | Admitting: Cardiology

## 2023-05-10 ENCOUNTER — Ambulatory Visit: Payer: Self-pay | Admitting: Neurology

## 2023-06-15 ENCOUNTER — Other Ambulatory Visit: Payer: Self-pay | Admitting: Obstetrics and Gynecology

## 2023-06-15 DIAGNOSIS — Z1231 Encounter for screening mammogram for malignant neoplasm of breast: Secondary | ICD-10-CM

## 2023-06-17 ENCOUNTER — Ambulatory Visit: Payer: Self-pay | Admitting: Dermatology

## 2023-06-18 NOTE — Progress Notes (Signed)
 The patient attended a screening event on 04/30/23 where her blood pressure was measured at 125/82 mmHg, and her A1C was 6.1 %, placing her in the diabetic range. During the event, the patient did not report if she does or does not smoke.  She did not report if she has insurance or not. She did report that she is established with a primary care provider (PCP), and does have a food identified social determinants of health (SDOH) concern. A chart review confirmed that the patient does have an active PCP.  The patient's most recent office visit was on 04/22/23.  prediabetes is appropriately listed in her medical record, and a follow-up appointment with her PCP is scheduled for 08/20/23. [facility] called pt using interpreter [phone], pt stated she would like for food resources to be emailed to her. Food resources were sent including an abn results letter.  Although the patient demonstrates consistent engagement in her care, due to the food sdoh insecurity An additional follow up will be done at a later date per the [employer] protocol.

## 2023-08-19 ENCOUNTER — Ambulatory Visit
Admission: RE | Admit: 2023-08-19 | Discharge: 2023-08-19 | Disposition: A | Discharge status: Home / Self Care | Payer: Self-pay | Source: Ambulatory Visit | Attending: Obstetrics and Gynecology | Admitting: Obstetrics and Gynecology

## 2023-08-19 ENCOUNTER — Ambulatory Visit: Payer: Self-pay | Admitting: *Deleted

## 2023-08-19 VITALS — BP 137/87 | Wt 173.2 lb

## 2023-08-19 DIAGNOSIS — Z1239 Encounter for other screening for malignant neoplasm of breast: Secondary | ICD-10-CM

## 2023-08-19 DIAGNOSIS — Z1211 Encounter for screening for malignant neoplasm of colon: Secondary | ICD-10-CM

## 2023-08-19 DIAGNOSIS — Z1231 Encounter for screening mammogram for malignant neoplasm of breast: Secondary | ICD-10-CM

## 2023-08-19 NOTE — Progress Notes (Signed)
 [name] is a 43 y.o. female who presents to [facility] clinic today with no complaints.    Pap Smear: Pap smear not completed today. Last Pap smear was 05/16/2020 at [facility] and was normal with negative HPV. Per patient has no history of an abnormal Pap smear. 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 Last Pap smear result is available in Epic.    Physical exam: Breasts Right breast slightly larger than left breast that per patient is normal for her. No skin abnormalities bilateral breasts. No nipple retraction bilateral breasts. No nipple discharge bilateral breasts. No lymphadenopathy. No lumps palpated bilateral breasts. No complaints of pain or tenderness on exam.       MS 3D SCR MAMMO BILAT BR (aka MM) Result Date: 07/06/2022 CLINICAL DATA:  Screening. EXAM: DIGITAL SCREENING BILATERAL MAMMOGRAM WITH TOMOSYNTHESIS AND CAD TECHNIQUE: Bilateral screening digital craniocaudal and mediolateral oblique mammograms were obtained. Bilateral screening digital breast tomosynthesis was performed. The images were evaluated with computer-aided detection. COMPARISON:  Previous exam(s). ACR Breast Density Category b: There are scattered areas of fibroglandular density. FINDINGS: There are no findings suspicious for malignancy. IMPRESSION: No mammographic evidence of malignancy. A result letter of this screening mammogram will be mailed directly to the patient. RECOMMENDATION: Screening mammogram in one year. (Code:SM-B-01Y) BI-RADS CATEGORY  1: Negative. Electronically Signed   By: Anitra Barn M.D.   On: 07/06/2022 10:19   MS DIGITAL SCREENING TOMO BILATERAL Result Date: 06/20/2021 CLINICAL DATA:  Screening. EXAM: DIGITAL SCREENING BILATERAL MAMMOGRAM WITH TOMOSYNTHESIS AND CAD TECHNIQUE: Bilateral screening digital craniocaudal and mediolateral oblique mammograms were obtained. Bilateral screening digital breast  tomosynthesis was performed. The images were evaluated with computer-aided detection. COMPARISON:  Previous exam(s). ACR Breast Density Category b: There are scattered areas of fibroglandular density. FINDINGS: There are no findings suspicious for malignancy. IMPRESSION: No mammographic evidence of malignancy. A result letter of this screening mammogram will be mailed directly to the patient. RECOMMENDATION: Screening mammogram in one year. (Code:SM-B-01Y) BI-RADS CATEGORY  1: Negative. Electronically Signed   By: Peggie Bowen M.D.   On: 06/20/2021 14:44   MS DIGITAL SCREENING TOMO BILATERAL Result Date: 05/21/2020 CLINICAL DATA:  Screening. EXAM: DIGITAL SCREENING BILATERAL MAMMOGRAM WITH TOMOSYNTHESIS AND CAD TECHNIQUE: Bilateral screening digital craniocaudal and mediolateral oblique mammograms were obtained. Bilateral screening digital breast tomosynthesis was performed. The images were evaluated with computer-aided detection. COMPARISON:  Previous exam(s). ACR Breast Density Category b: There are scattered areas of fibroglandular density. FINDINGS: There are no findings suspicious for malignancy. The images were evaluated with computer-aided detection. IMPRESSION: No mammographic evidence of malignancy. A result letter of this screening mammogram will be mailed directly to the patient. RECOMMENDATION: Screening mammogram in one year. (Code:SM-B-01Y) BI-RADS CATEGORY  1: Negative. Electronically Signed   By: Dina  Arceo M.D.   On: 05/21/2020 07:17   MS DIGITAL DIAG TOMO BILAT Result Date: 05/10/2019 CLINICAL DATA:  Patient complains of a palpable right breast mass. 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 EXAM: DIGITAL DIAGNOSTIC BILATERAL MAMMOGRAM WITH CAD AND TOMO ULTRASOUND RIGHT BREAST COMPARISON:  Prior ultrasounds dated 12/09/2011 and 12/16/2011. ACR Breast Density Category b: There are scattered areas of fibroglandular density. FINDINGS:  There is a lobulated 2.2 cm mass in the medial aspect of the right breast with a ribbon shaped clip. No additional mass is seen in the right breast. There is  no mass seen in the left breast. There are no malignant type microcalcifications in either breast. Mammographic images were processed with CAD. Targeted ultrasound is performed, showing a hypoechoic mass in the right breast at 3 o'clock 2 cm from the nipple measuring 2.2 x 0.7 x 1.1 cm. On the prior exam dated 12/09/2011 it measured 2.2 x 0.8 x 1.5 cm. 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 No evidence of malignancy in either breast. RECOMMENDATION: Bilateral screening mammogram in 1 year is recommended. I have discussed the findings and recommendations with the patient. If applicable, a reminder letter will be sent to the patient regarding the next appointment. BI-RADS CATEGORY  2: Benign. Electronically Signed   By: Dina  Arceo M.D.   On: 05/10/2019 14:24    Pelvic/Bimanual Pap is not indicated today per BCCCP guidelines.    Smoking History: Patient is a former smoker that quit in 2003.   Patient Navigation: Patient education provided. Access to services provided for patient through [facility] program. Spanish interpreter Barbette Boom from [facility] provided.   Colorectal Cancer Screening: Per patient had a colonoscopy completed 2 years ago. FIT Test given to patient to complete. No complaints today.    Breast and Cervical Cancer Risk Assessment: Patient does not have family history of breast cancer, known genetic mutations, or radiation treatment to the chest before age 44. Patient does not have history of cervical dysplasia, immunocompromised, or DES exposure in-utero.  Risk Scores as of Encounter on 08/19/2023     Brooke Lam           5-year 0.79%   Lifetime 7.82%   This patient is [place] but has no documented birth country, so the [place] model used data from [place] patients to calculate  their risk score. Document a birth country in the Demographics activity for a more accurate score.         Last calculated by Rudy Costain, CMA on 08/19/2023 at  9:42 AM        A: BCCCP exam without pap smear No complaints.  P: Referred patient to the [facility] for a screening mammogram on mobile unit. Appointment scheduled Thursday, Aug 19, 2023 at 1030.   Stefan Edge, RN 08/19/2023 9:51 AM

## 2023-08-19 NOTE — Patient Instructions (Signed)
 Explained breast self awareness with Brooke Lam. Patient did not need a Pap smear today due to last Pap smear and HPV typing was 05/16/2020. Let her know BCCCP will cover Pap smears and HPV typing every 5 years unless has a history of abnormal Pap smears. Referred patient to the [facility] for a screening mammogram on mobile unit. Appointment scheduled Thursday, Aug 19, 2023 at 1030. Patient aware of appointment and will be there. Let patient know the Breast Center will follow up with her within the next couple weeks with results of mammogram by letter or phone. Brooke Lam verbalized understanding.  Andrue Dini, Dela Favor, RN 9:51 AM

## 2023-08-20 ENCOUNTER — Ambulatory Visit: Payer: Self-pay | Admitting: Internal Medicine
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
## 2023-08-23 ENCOUNTER — Ambulatory Visit: Payer: Self-pay | Admitting: Neurology

## 2023-08-25 ENCOUNTER — Ambulatory Visit (HOSPITAL_COMMUNITY)
Admission: EM | Admit: 2023-08-25 | Discharge: 2023-08-25 | Disposition: A | Discharge status: Home / Self Care | Attending: Emergency Medicine | Admitting: Emergency Medicine

## 2023-08-25 ENCOUNTER — Encounter (HOSPITAL_COMMUNITY): Payer: Self-pay | Admitting: Emergency Medicine

## 2023-08-25 DIAGNOSIS — R109 Unspecified abdominal pain: Secondary | ICD-10-CM | POA: Insufficient documentation

## 2023-08-25 DIAGNOSIS — N3001 Acute cystitis with hematuria: Secondary | ICD-10-CM | POA: Insufficient documentation

## 2023-08-25 DIAGNOSIS — N898 Other specified noninflammatory disorders of vagina: Secondary | ICD-10-CM | POA: Insufficient documentation

## 2023-08-25 LAB — POCT URINALYSIS DIP (MANUAL ENTRY)
Bilirubin, UA: NEGATIVE
Glucose, UA: NEGATIVE mg/dL
Ketones, POC UA: NEGATIVE mg/dL
Nitrite, UA: NEGATIVE
Protein Ur, POC: NEGATIVE mg/dL
Spec Grav, UA: 1.02
Urobilinogen, UA: 0.2 U/dL
pH, UA: 6

## 2023-08-25 MED ORDER — SULFAMETHOXAZOLE-TRIMETHOPRIM 800-160 MG PO TABS: 1.0000 | ORAL_TABLET | Freq: Two times a day (BID) | ORAL | 0 refills | Status: AC

## 2023-08-25 MED ORDER — FLUCONAZOLE 150 MG PO TABS: ORAL_TABLET | ORAL | 0 refills | Status: DC

## 2023-08-25 NOTE — ED Provider Notes (Signed)
 [facility]    CSN: [id number] Arrival date & time: 08/25/23  0801      History   Chief Complaint Chief Complaint  Patient presents with   Flank Pain    HPI Brooke Lam is a 43 y.o. female.   Patient presents to clinic over concern of bilateral flank pain, urgency and frequency.  Symptoms started around 5 days ago.  She was getting up last night throughout the night to urinate when she turned on the lights she noticed that there was blood in her urine.  Does not have a dysuria.  Does not have nausea, vomiting or abdominal pain.  Has not had any fevers.  This morning she started to have some vaginal itching.  Has not had changes to her vaginal discharge, denies odor.  Denies new sexual partners.  Has had a hysterectomy.  The history is provided by the patient and medical records.  Flank Pain    Past Medical History:  Diagnosis Date   Anemia    Blood transfusion without reported diagnosis    Depression    Ectopic pregnancy    Hyperlipidemia    Hypertension    Preterm labor     Patient Active Problem List   Diagnosis Date Noted   Endometrioma of ovary 08/03/2022   Cervix still in place 06/02/2022   Language barrier 06/01/2022   History of ovarian cyst 06/01/2022   PCB (post coital bleeding) 06/01/2022   Empty sella (HCC) 04/10/2022   Bilateral carpal tunnel syndrome 04/10/2022   Cephalalgia 04/10/2022   Mixed hyperlipidemia 07/15/2021   Prediabetes 07/15/2021   Fibroadenoma of breast, 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 Authorizing Provider  fluconazole  (DIFLUCAN ) 150 MG tablet Take on last day of antibiotics 08/25/23  Yes Siarra Gilkerson  N, FNP  sulfamethoxazole -trimethoprim  (BACTRIM  DS) 800-160 MG tablet Take 1 tablet by mouth 2 (two) times daily for 3 days. 08/25/23 08/28/23 Yes Harlow Lighter, Darick Fetters  N, FNP    Family History Family History  Problem Relation Age of Onset   Diabetes Mother    Hypertension Mother    Diabetes Father    Hypertension Father    Diabetes Paternal Aunt    Breast cancer Paternal Aunt    Diabetes Paternal Grandmother    Anesthesia problems Neg Hx    Hearing loss Neg Hx    Other Neg Hx    Stomach cancer Neg Hx    Rectal cancer Neg Hx    Esophageal cancer Neg Hx    Colon cancer Neg Hx     Social History Social History   Tobacco Use   Smoking status: Former    Current packs/day: 0.00  Types: Cigarettes    Quit date: 2005    Years since quitting: 20.4   Smokeless tobacco: Never  Vaping Use   Vaping status: Never Used  Substance Use Topics   Alcohol use: Not Currently    Comment: every 8 days 5-8 drinks of wine and whiskey (occ)   Drug use: No     Allergies   Patient has no known allergies.   Review of Systems Review of Systems  Per HPI  Physical Exam Triage Vital Signs ED Triage Vitals  Encounter Vitals Group     BP 08/25/23 0819 125/84     Systolic BP Percentile --      Diastolic BP Percentile --      Pulse Rate 08/25/23 0819 70     Resp 08/25/23 0819 17     Temp 08/25/23 0819 98 F (36.7 C)     Temp Source 08/25/23 0819 Oral     SpO2 08/25/23 0819 98 %     Weight --      Height --      Head Circumference --      Peak Flow --      Pain Score 08/25/23 0818 6     Pain Loc --      Pain Education --      Exclude from Growth Chart --    No data found.  Updated Vital Signs BP 125/84 (BP Location: Right Arm)   Pulse 70   Temp 98 F (36.7 C) (Oral)    Resp 17   LMP 10/08/2011   SpO2 98%   Visual Acuity Right Eye Distance:   Left Eye Distance:   Bilateral Distance:    Right Eye Near:   Left Eye Near:    Bilateral Near:     Physical Exam Vitals and nursing note reviewed.  Constitutional:      Appearance: Normal appearance.  HENT:     Head: Normocephalic and atraumatic.     Right Ear: External ear normal.     Left Ear: External ear normal.     Nose: Nose normal.     Mouth/Throat:     Mouth: Mucous membranes are moist.  Eyes:     Conjunctiva/sclera: Conjunctivae normal.  Cardiovascular:     Rate and Rhythm: Normal rate.  Pulmonary:     Effort: Pulmonary effort is normal. No respiratory distress.  Abdominal:     Tenderness: There is no right CVA tenderness or left CVA tenderness.  Skin:    General: Skin is warm and dry.  Neurological:     General: No focal deficit present.     Mental Status: She is alert.  Psychiatric:        Mood and Affect: Mood normal.      UC Treatments / Results  Labs (all labs ordered are listed, but only abnormal results are displayed) Labs Reviewed  POCT URINALYSIS DIP (MANUAL ENTRY) - Abnormal; Notable for the following components:      Result Value   Blood, UA moderate (*)    Leukocytes, UA Trace (*)    All other components within normal limits  URINE CULTURE  CERVICOVAGINAL ANCILLARY ONLY    EKG   Radiology No results found.  Procedures Procedures (including critical care time)  Medications Ordered in UC Medications - No data to display  Initial Impression / Assessment and Plan / UC Course  I have reviewed the triage vital signs and the nursing notes.  Pertinent labs & imaging results that were  available during my care of the patient were reviewed by me and considered in my medical decision making (see chart for details).  Vitals in triage reviewed, patient is hemodynamically stable.  Afebrile, without tachycardia negative for CVA tenderness, low concern for  pyelonephritis.  POC urinalysis shows moderate red blood cells and trace leukocytes, will send for culture and treat for acute cystitis.  Some vaginal itching, cytology swab obtained, will treat empirically for yeast vaginitis.  Plan of care, follow-up care return precautions given, no questions at this time.    Final Clinical Impressions(s) / UC Diagnoses   Final diagnoses:  Flank pain  Acute cystitis with hematuria  Vaginal itching     Discharge Instructions      Take the Bactrim  twice daily for the next 3 days to treat your urinary tract infection.  Ensure you are drinking at least 64 ounces of water daily to help flush your kidneys.  On the last day of antibiotics you can take a Diflucan  to help prevent vaginal yeast infection.  Staff will contact you if additional treatment is needed based on urine culture and vaginal swab results.  Return to clinic for new or urgent symptoms.    ED Prescriptions     Medication Sig Dispense Auth. Provider   sulfamethoxazole -trimethoprim  (BACTRIM  DS) 800-160 MG tablet Take 1 tablet by mouth 2 (two) times daily for 3 days. 6 tablet Harlow Lighter, Kathlean Cinco  N, FNP   fluconazole  (DIFLUCAN ) 150 MG tablet Take on last day of antibiotics 1 tablet Harlow Lighter, Oakes Mccready  N, FNP      PDMP not reviewed this encounter.   Harlow Lighter, Ryaan Vanwagoner  N, FNP 08/25/23 [phone]

## 2023-08-25 NOTE — Discharge Instructions (Signed)
 Take the Bactrim  twice daily for the next 3 days to treat your urinary tract infection.  Ensure you are drinking at least 64 ounces of water daily to help flush your kidneys.  On the last day of antibiotics you can take a Diflucan  to help prevent vaginal yeast infection.  Staff will contact you if additional treatment is needed based on urine culture and vaginal swab results.  Return to clinic for new or urgent symptoms.

## 2023-08-25 NOTE — ED Triage Notes (Signed)
 Sunday started having right flank pain. Yesterday having urinary frequency. Noticed some blood in her urine. Denies dysuria.

## 2023-08-26 LAB — CERVICOVAGINAL ANCILLARY ONLY
Bacterial Vaginitis (gardnerella): POSITIVE — AB
Candida Glabrata: NEGATIVE
Candida Vaginitis: NEGATIVE
Chlamydia: NEGATIVE
Comment: NEGATIVE
Comment: NEGATIVE
Comment: NEGATIVE
Comment: NEGATIVE
Comment: NEGATIVE
Comment: NORMAL
Neisseria Gonorrhea: NEGATIVE
Trichomonas: NEGATIVE

## 2023-08-26 LAB — URINE CULTURE

## 2023-08-26 NOTE — Progress Notes (Deleted)
 Patient ID: Emeri Estill, female   DOB: [birth year], 43 y.o.   MRN: [id number]  Seen at [facility] 08/25/2023 for flank pain HPI Frankye Schwegel is a 43 y.o. female.    Patient presents to clinic over concern of bilateral flank pain, urgency and frequency.  Symptoms started around 5 days ago.  She was getting up last night throughout the night to urinate when she turned on the lights she noticed that there was blood in her urine.  Does not have a dysuria.  Does not have nausea, vomiting or abdominal pain.  Has not had any fevers.   This morning she started to have some vaginal itching.  Has not had changes to her vaginal discharge, denies odor.  Denies new sexual partners.   Has had a hysterectomy.  Take the Bactrim  twice daily for the next 3 days to treat your urinary tract infection.  Ensure you are drinking at least 64 ounces of water daily to help flush your kidneys.  On the last day of antibiotics you can take a Diflucan  to help prevent vaginal yeast infection.  Staff will contact you if additional treatment is needed based on urine culture and vaginal swab results.

## 2023-08-27 ENCOUNTER — Ambulatory Visit: Admitting: Physician Assistant

## 2023-08-27 ENCOUNTER — Ambulatory Visit: Payer: Self-pay | Admitting: Emergency Medicine

## 2023-08-27 MED ORDER — METRONIDAZOLE 500 MG PO TABS: 500.0000 mg | ORAL_TABLET | Freq: Two times a day (BID) | ORAL | 0 refills | Status: AC

## 2023-08-31 ENCOUNTER — Other Ambulatory Visit: Payer: Self-pay

## 2023-08-31 ENCOUNTER — Encounter (HOSPITAL_COMMUNITY): Payer: Self-pay

## 2023-08-31 ENCOUNTER — Ambulatory Visit (HOSPITAL_COMMUNITY)
Admission: RE | Admit: 2023-08-31 | Discharge: 2023-08-31 | Disposition: A | Discharge status: Home / Self Care | Payer: Self-pay | Source: Ambulatory Visit | Attending: Emergency Medicine | Admitting: Emergency Medicine

## 2023-08-31 VITALS — BP 132/84 | HR 77 | Temp 98.8°F | Resp 20

## 2023-08-31 DIAGNOSIS — B9689 Other specified bacterial agents as the cause of diseases classified elsewhere: Secondary | ICD-10-CM | POA: Insufficient documentation

## 2023-08-31 DIAGNOSIS — N760 Acute vaginitis: Secondary | ICD-10-CM | POA: Insufficient documentation

## 2023-08-31 DIAGNOSIS — R350 Frequency of micturition: Secondary | ICD-10-CM | POA: Insufficient documentation

## 2023-08-31 LAB — POCT URINALYSIS DIP (MANUAL ENTRY)
Bilirubin, UA: NEGATIVE
Glucose, UA: NEGATIVE mg/dL
Leukocytes, UA: NEGATIVE
Nitrite, UA: NEGATIVE
Protein Ur, POC: NEGATIVE mg/dL
Spec Grav, UA: 1.02 (ref 1.010–1.025)
Urobilinogen, UA: 0.2 U/dL
pH, UA: 6 (ref 5.0–8.0)

## 2023-08-31 NOTE — ED Provider Notes (Signed)
 [facility]    CSN: [id number] Arrival date & time: 08/31/23  1420      History   Chief Complaint Chief Complaint  Patient presents with   Urinary Frequency    urine culture inconclusive. Pt is still having symptoms after Bactrim . - Entered by patient    HPI Brooke Lam is a 43 y.o. female.   43 y.o. female who presents to urgent care with complaints of persistent urinary frequency and mild urgency and vaginal itching. She was seen for this on 6/4 and at that time symptoms had been going on for about 5 days. The symptoms started with back pain in the lower back.  The next day she began to have frequency.  The day before she came in for an appointment she started having vaginal itching and some vaginal discharge.  She was started on Bactrim  and her urine was sent for culture.  This revealed mixed species.  She also had a cervical swab which showed bacterial vaginitis.  She finished the course of Bactrim  but just picked up the prescription yesterday for the metronidazole .  She reports that she continues to have vaginal itching.  She reports her back pain was not 8-9 out of 10 but is now more like a 3 out of 10.  She has continued to have frequency of urination but denies dysuria, fever, abdominal pain, nausea, vomiting.  She does relate that right before she began having back pain that she had been working through the night doing a lot of walking.  She denies any injury to her back in the past. She does not consume a large amount of caffeine.   Does note similar symptoms about a year ago where no source was found for the urinary symptoms and she states she has the symptoms on and off since she had a hysterectomy. Has an appointment at her gynecologist in July to discuss this.     Urinary Frequency Pertinent negatives include no chest pain, no abdominal pain and no shortness of breath.    Past Medical History:  Diagnosis Date   Anemia    Blood transfusion without  reported diagnosis    Depression    Ectopic pregnancy    Hyperlipidemia    Hypertension    Preterm labor     Patient Active Problem List   Diagnosis Date Noted   Endometrioma of ovary 08/03/2022   Cervix still in place 06/02/2022   Language barrier 06/01/2022   History of ovarian cyst 06/01/2022   PCB (post coital bleeding) 06/01/2022   Empty sella (HCC) 04/10/2022   Bilateral carpal tunnel syndrome 04/10/2022   Cephalalgia 04/10/2022   Mixed hyperlipidemia 07/15/2021   Prediabetes 07/15/2021   Fibroadenoma of breast, right 05/11/2019   Dyspareunia 11/12/2014   Dysuria 09/17/2011    Past Surgical History:  Procedure Laterality Date   ABDOMINAL HYSTERECTOMY     BREAST BIOPSY Right 2013   FA   CESAREAN SECTION     C/S x 4   UPPER GASTROINTESTINAL ENDOSCOPY  04/23/2020    OB History     Gravida  7   Para  5   Term  3   Preterm  2   AB  2   Living  4      SAB  1   IAB  0   Ectopic  1   Multiple  0   Live Births  4            Home Medications  Prior to Admission medications   Medication Sig Start Date End Date Taking? Authorizing Provider  fluconazole  (DIFLUCAN ) 150 MG tablet Take on last day of antibiotics 08/25/23   Harlow Lighter, [place]  N, FNP  metroNIDAZOLE  (FLAGYL ) 500 MG tablet Take 1 tablet (500 mg total) by mouth 2 (two) times daily for 7 days. 08/27/23 09/03/23  Ann Keto, MD    Family History Family History  Problem Relation Age of Onset   Diabetes Mother    Hypertension Mother    Diabetes Father    Hypertension Father    Diabetes Paternal Aunt    Breast cancer Paternal Aunt    Diabetes Paternal Grandmother    Anesthesia problems Neg Hx    Hearing loss Neg Hx    Other Neg Hx    Stomach cancer Neg Hx    Rectal cancer Neg Hx    Esophageal cancer Neg Hx    Colon cancer Neg Hx     Social History Social History   Tobacco Use   Smoking status: Former    Current packs/day: 0.00    Types: Cigarettes    Quit date: 2005     Years since quitting: 20.4   Smokeless tobacco: Never  Vaping Use   Vaping status: Never Used  Substance Use Topics   Alcohol use: Not Currently    Comment: every 8 days 5-8 drinks of wine and whiskey (occ)   Drug use: No     Allergies   Patient has no known allergies.   Review of Systems Review of Systems  Constitutional:  Negative for chills and fever.  HENT:  Negative for ear pain and sore throat.   Eyes:  Negative for pain and visual disturbance.  Respiratory:  Negative for cough and shortness of breath.   Cardiovascular:  Negative for chest pain and palpitations.  Gastrointestinal:  Negative for abdominal pain and vomiting.  Genitourinary:  Positive for frequency, urgency and vaginal discharge. Negative for decreased urine volume, difficulty urinating, dysuria, hematuria and pelvic pain.  Musculoskeletal:  Positive for back pain (lower back). Negative for arthralgias.  Skin:  Negative for color change and rash.  Neurological:  Negative for seizures and syncope.  All other systems reviewed and are negative.    Physical Exam Triage Vital Signs ED Triage Vitals  Encounter Vitals Group     BP 08/31/23 1525 132/84     Systolic BP Percentile --      Diastolic BP Percentile --      Pulse Rate 08/31/23 1525 77     Resp 08/31/23 1525 20     Temp 08/31/23 1525 98.8 F (37.1 C)     Temp Source 08/31/23 1525 Oral     SpO2 08/31/23 1525 95 %     Weight --      Height --      Head Circumference --      Peak Flow --      Pain Score 08/31/23 1523 4     Pain Loc --      Pain Education --      Exclude from Growth Chart --    No data found.  Updated Vital Signs BP 132/84 (BP Location: Right Arm)   Pulse 77   Temp 98.8 F (37.1 C) (Oral)   Resp 20   LMP 10/08/2011   SpO2 95%   Visual Acuity Right Eye Distance:   Left Eye Distance:   Bilateral Distance:    Right Eye Near:   Left Eye Near:  Bilateral Near:     Physical Exam Vitals and nursing note  reviewed.  Constitutional:      General: She is not in acute distress.    Appearance: She is well-developed.  HENT:     Head: Normocephalic and atraumatic.  Eyes:     Conjunctiva/sclera: Conjunctivae normal.  Cardiovascular:     Rate and Rhythm: Normal rate and regular rhythm.     Heart sounds: No murmur heard. Pulmonary:     Effort: Pulmonary effort is normal. No respiratory distress.     Breath sounds: Normal breath sounds.  Abdominal:     Palpations: Abdomen is soft.     Tenderness: There is no abdominal tenderness. There is no right CVA tenderness or left CVA tenderness.  Musculoskeletal:        General: No swelling.     Cervical back: Neck supple.       Back:  Skin:    General: Skin is warm and dry.     Capillary Refill: Capillary refill takes less than 2 seconds.  Neurological:     Mental Status: She is alert.  Psychiatric:        Mood and Affect: Mood normal.      UC Treatments / Results  Labs (all labs ordered are listed, but only abnormal results are displayed) Labs Reviewed  POCT URINALYSIS DIP (MANUAL ENTRY) - Abnormal; Notable for the following components:      Result Value   Ketones, POC UA trace (5) (*)    Blood, UA small (*)    All other components within normal limits  URINE CULTURE    EKG   Radiology No results found.  Procedures Procedures (including critical care time)  Medications Ordered in UC Medications - No data to display  Initial Impression / Assessment and Plan / UC Course  I have reviewed the triage vital signs and the nursing notes.  Pertinent labs & imaging results that were available during my care of the patient were reviewed by me and considered in my medical decision making (see chart for details).     Urinary frequency  BV (bacterial vaginosis)   Urinalysis done today shows only small amount of red blood cells and trace ketones.  There is no longer any leukocytes. Has finished bactrim .  Will send this off for  culture.  The symptoms have gotten better after 2 days of metronidazole  and will continue this medication through its course.  Pending the urine culture, we may need to start medication for a possible urinary tract infection.  Recommend keeping follow-up appointment with gynecology as scheduled. Return to urgent care or PCP if symptoms worsen or fail to resolve.    Final Clinical Impressions(s) / UC Diagnoses   Final diagnoses:  Urinary frequency  BV (bacterial vaginosis)     Discharge Instructions      Urinalysis done today shows only small amount of red blood cells and trace ketones.  There is no longer any leukocytes.  Will send this off for culture.  The symptoms have gotten better after 2 days of metronidazole  and will continue this medication through its course.  Pending the urine culture, we may need to start medication for a possible urinary tract infection.  Recommend keeping follow-up appointment with gynecology as scheduled. Return to urgent care or PCP if symptoms worsen or fail to resolve.     ED Prescriptions   None    PDMP not reviewed this encounter.   Kreg Pesa, [place] 08/31/23 1628

## 2023-08-31 NOTE — Discharge Instructions (Addendum)
 Urinalysis done today shows only small amount of red blood cells and trace ketones.  There is no longer any leukocytes.  Will send this off for culture.  The symptoms have gotten better after 2 days of metronidazole  and will continue this medication through its course.  Pending the urine culture, we may need to start medication for a possible urinary tract infection.  Recommend keeping follow-up appointment with gynecology as scheduled. Return to urgent care or PCP if symptoms worsen or fail to resolve.

## 2023-08-31 NOTE — ED Triage Notes (Signed)
 Seen on 6/4 for urinary issues.  Results are not conclusive.  Patient is having itching and frequent urination.  Back pain is slightly better

## 2023-09-01 ENCOUNTER — Ambulatory Visit (HOSPITAL_COMMUNITY): Payer: Self-pay

## 2023-09-01 LAB — URINE CULTURE: Culture: NO GROWTH

## 2023-09-23 ENCOUNTER — Ambulatory Visit: Admitting: Internal Medicine

## 2023-10-11 ENCOUNTER — Ambulatory Visit: Admitting: Family Medicine

## 2023-10-12 ENCOUNTER — Other Ambulatory Visit: Payer: Self-pay

## 2023-10-12 ENCOUNTER — Encounter: Payer: Self-pay | Admitting: Obstetrics and Gynecology

## 2023-10-12 ENCOUNTER — Ambulatory Visit (INDEPENDENT_AMBULATORY_CARE_PROVIDER_SITE_OTHER): Payer: Self-pay | Admitting: Obstetrics and Gynecology

## 2023-10-12 ENCOUNTER — Other Ambulatory Visit (HOSPITAL_COMMUNITY)
Admission: RE | Admit: 2023-10-12 | Discharge: 2023-10-12 | Disposition: A | Discharge status: Home / Self Care | Payer: Self-pay | Source: Ambulatory Visit | Attending: Obstetrics and Gynecology | Admitting: Obstetrics and Gynecology

## 2023-10-12 VITALS — BP 119/81 | HR 65 | Ht 63.0 in | Wt 176.6 lb

## 2023-10-12 DIAGNOSIS — N951 Menopausal and female climacteric states: Secondary | ICD-10-CM

## 2023-10-12 DIAGNOSIS — N80129 Deep endometriosis of ovary, unspecified ovary: Secondary | ICD-10-CM

## 2023-10-12 DIAGNOSIS — N898 Other specified noninflammatory disorders of vagina: Secondary | ICD-10-CM

## 2023-10-12 DIAGNOSIS — Z758 Other problems related to medical facilities and other health care: Secondary | ICD-10-CM

## 2023-10-12 DIAGNOSIS — Z603 Acculturation difficulty: Secondary | ICD-10-CM

## 2023-10-12 NOTE — Progress Notes (Signed)
?   Menopause 7 months hx, very sporadic    Subjective:    Patient ID: Brooke Lam, female    DOB: 10-Oct-1980, 43 y.o.   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  Pt c/o 7 month history of intermittent fatigue and possible vasomotor symptoms.  Pt also has a history of possible endometrioma.  She appears to have missed follow up back in January.   Review of Systems     Objective:   Physical Exam Vitals:   10/12/23 1012  BP: 119/81  Pulse: 65   CLINICAL DATA:  Endometrioma, follow-up examination.   EXAM: TRANSABDOMINAL AND TRANSVAGINAL ULTRASOUND OF PELVIS   TECHNIQUE: Both transabdominal and transvaginal ultrasound examinations of the pelvis were performed. Transabdominal technique was performed for global imaging of the pelvis including uterus, ovaries, adnexal regions, and pelvic cul-de-sac. It was necessary to proceed with endovaginal exam following the transabdominal exam to visualize the adnexa bilaterally.   COMPARISON:  07/06/2022, 05/29/2021, CT 04/09/2022   FINDINGS: Uterus   Absent   Right ovary   Measurements: 8.1 x 4.2 x 4.5 cm = volume: 79 mL. The complex cystic lesion within the right ovary is again identified demonstrating uniform internal echoes measuring 5.8 x 3.6 x 4.3 cm, unchanged in size when accounting for changes in measurement technique and compatible with a benign endometrioma. Adjacent to this has developed a 3.5 x 2.9 x 2.4 cm simple cyst most in keeping with a follicular cyst. No follow-up imaging is recommended for either lesion.   Left ovary   Measurements: 2.7 x 2.2 x 2.5 cm = volume: 8 mL. Normal appearance/no adnexal mass.   Other findings   No abnormal free fluid.   IMPRESSION: 1. Stable 5.8 cm complex cystic lesion within the right ovary compatible with a benign endometrioma. 2. Interval development of a 3.5 cm simple cyst within the right ovary most in keeping with a  follicular cyst. No follow-up imaging is recommended for either lesion. 3. Status post hysterectomy.          Assessment & Plan:   1. Vasomotor symptoms due to menopause (Primary) Will check FSH and T4 for cause of symptoms - Follicle stimulating hormone - TSH + free T4  2. Endometrioma of ovary Follow up in about a month to discuss repeat imaging versus referral to [facility] for possible removal or expectant management  3. Language barrier Tele interpreter utilized  4. Vaginal irritation Self swab done today - Cervicovaginal ancillary only    [name] DELENA Buddle, MD Faculty Attending, [facility]

## 2023-10-13 LAB — CERVICOVAGINAL ANCILLARY ONLY
Bacterial Vaginitis (gardnerella): NEGATIVE
Candida Glabrata: NEGATIVE
Candida Vaginitis: NEGATIVE
Comment: NEGATIVE
Comment: NEGATIVE
Comment: NEGATIVE
Comment: NEGATIVE
Trichomonas: NEGATIVE

## 2023-10-13 LAB — FOLLICLE STIMULATING HORMONE: FSH: 5.9 m[IU]/mL

## 2023-10-13 LAB — TSH+FREE T4
Free T4: 1.08 ng/dL (ref 0.82–1.77)
TSH: 0.814 u[IU]/mL (ref 0.450–4.500)

## 2023-10-14 ENCOUNTER — Ambulatory Visit: Payer: Self-pay | Admitting: Obstetrics and Gynecology

## 2023-10-22 NOTE — Progress Notes (Signed)
 The patient attended a screening event on 04/30/2023 where her screening results were a BP of 125/82 and A1C of 6.1 placing her in the diabetic range. At the event the patient did not report if she smokes or if she has insurance. The patient did note that she does have a PCP and identified a food SDOH need. During post event f/u the patient requested for food resources to be sent to her along with abn results letter. During 60 day f/u [facility] was able to make contact with the patient by using interpreter [phone]. Patient stated she does have a PCP and her last appt was about 2 months ago. She also stated she has the [facility] orange card for medical insurance and still has a food SDOH need. Patient stated she did not receive letter and resources so that information was mailed again. Per chart review she does have a Environmental education officer plan listed and her last visit with her PCP, Dr. Barnie Louder, was on 08/31/2023 as an emergency visit. Patient did no show her June and July office visits. An additional follow up will be done in according to the health equity team's protocol.

## 2023-10-27 ENCOUNTER — Telehealth: Payer: Self-pay

## 2023-10-27 NOTE — Telephone Encounter (Signed)
 Pt called requesting results to find out if she is menopausal.  Per chart review, pt is not menopausal and swabs are normal.  Pt notified.   Jhordan Mckibben,RN  10/27/23

## 2023-11-12 ENCOUNTER — Ambulatory Visit: Payer: Self-pay | Admitting: Obstetrics and Gynecology

## 2023-12-23 ENCOUNTER — Encounter: Payer: Self-pay | Admitting: *Deleted

## 2023-12-23 NOTE — Progress Notes (Signed)
 The patient attended a screening event on 04/30/2023 where her BP owas 125/82 and her A1C was 6.1 .  At the event the patient did not report if she smoked or if she had insurance. During consequent health equity f/u, pt did note she had food insecurities and resources were mailed to her although during the 6 month post event f/u today, the pt stated via the interpreter Curlee that she had not received them. Chart review indicates that pt has seen the [facility] program provider on 08/19/23, last saw her PCP office on 05/06/23 (saw her PCP Dr. Barnie Louder on 1/303/25. During the f/u call today, pt shared through the interpreter Curlee that she had no-showed her PCP appt since February due once to COVID and another time due to being sick with the flu. She stated that she had tried to r/s and left messages but no one had called her back. Health equity team member today call the [facility] with interpreter Curlee on the phone and appt made with PCP provider partner for Oct. 27, 2025 at 9:50 A. Pt instructed to bring her financial assistance letter she said she had along with her medicines to that appt and to notify the PCP office that she needed help with ongoing food support so that they could connect her to the RN care manager or office SW. Pt also requested food pantry info and her address was confirmed with interpreter help since pt had reported she did not get past letters with food resources that were sent. Closest food pantry on her same street from [place] and health equity list of food banks mailed to pt along with reminder letter about her PCP appt, in [place]. No additional health equity team support scheduled at this time.

## 2023-12-24 ENCOUNTER — Encounter: Payer: Self-pay | Attending: Nurse Practitioner | Admitting: Dietician

## 2023-12-24 DIAGNOSIS — R7303 Prediabetes: Secondary | ICD-10-CM | POA: Insufficient documentation

## 2023-12-24 NOTE — Progress Notes (Signed)
 Patient came to our Nutrition and Diabetes office requesting a blood glucose meter. She is uninsured.  She states that she has been diagnosed with prediabetes and has a long family history of type 2 diabetes and would like to begin testing her blood glucose.   Gave her a free [employer] Blood glucose meter, Lot [id number], Expiration 05/13/2024.  Taught patient how to use this.  Glucose was 106 after a meal.  Discussed normal fasting blood glucose and post meal blood glucose.   Discussed that it would be good for her to make an appointment to see us  again.  She was last seen by one of our dietitians on 03/05/2022.  Her last A1C was 6.1% 04/30/2023.  She will need an updated referral.  Discussed times to check her blood glucose.  Leita Constable, RD, LDN, CDCES, DipACLM

## 2024-01-17 ENCOUNTER — Ambulatory Visit: Payer: Self-pay | Attending: Nurse Practitioner | Admitting: Nurse Practitioner
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
## 2024-03-20 ENCOUNTER — Encounter: Payer: Self-pay | Admitting: Neurology

## 2024-03-20 ENCOUNTER — Ambulatory Visit: Payer: Self-pay | Admitting: Neurology

## 2024-04-03 ENCOUNTER — Encounter: Payer: Self-pay | Admitting: Orthopedic Surgery

## 2024-04-03 ENCOUNTER — Ambulatory Visit (INDEPENDENT_AMBULATORY_CARE_PROVIDER_SITE_OTHER): Payer: Self-pay | Admitting: Orthopedic Surgery

## 2024-04-03 DIAGNOSIS — G5601 Carpal tunnel syndrome, right upper limb: Secondary | ICD-10-CM

## 2024-04-03 NOTE — Progress Notes (Unsigned)
 Pigmented  Office Visit Note   Patient: Brooke Lam           Date of Birth: 15-Aug-1980           MRN: [id number] Visit Date: 04/03/2024 Requested by: Vicci Barnie NOVAK, MD [address] [place],  [place] [address] PCP: Vicci Barnie NOVAK, MD  Subjective: Chief Complaint  Patient presents with   Other    Numbness and tingling in right upper extremity    HPI: Brooke Lam is a 44 y.o. female who presents to the office reporting right hand pain numbness and tingling.  Has known history of severe bilateral carpal tunnel syndrome and has not been seen in 2 years.  Patient is right-hand dominant.  States that nighttime symptoms are worse.  Does wake for 5-6 times a night.  She has to shake her hand to work out her symptoms.  Brace only helps sporadically.  Symptoms are worse now than when she was seen in 2024.  She does housekeeping..                ROS: All systems reviewed are negative as they relate to the chief complaint within the history of present illness.  Patient denies fevers or chills.  Assessment & Plan: Visit Diagnoses:  1. Carpal tunnel syndrome, right upper limb     Plan: Impression is symptomatic carpal tunnel syndrome worse on the right-hand side.  Failure of conservative management.  Plan is right carpal tunnel release.  Risks and benefits were discussed with the patient including not limited to infection nerve or vessel damage incomplete functional restoration and incomplete pain weak.  Anticipated weeks 4 weeks before she would feel good about doing any type of physical activity with the hand.  Plan to take sutures out at 10 days postop.  All questions answered.  Follow-Up Instructions: No follow-ups on file.   Orders:  No orders of the defined types were placed in this encounter.  No orders of the defined types were placed in this encounter.     Procedures: No procedures performed   Clinical Data: No additional  findings.  Objective: Vital Signs: LMP 10/08/2011   Physical Exam:  Constitutional: Patient appears well-developed HEENT:  Head: Normocephalic Eyes:EOM are normal Neck: Normal range of motion Cardiovascular: Normal rate Pulmonary/chest: Effort normal Neurologic: Patient is alert Skin: Skin is warm Psychiatric: Patient has normal mood and affect  Ortho Exam: Orthopedic exam demonstrates intact grip EPL FPL interosseous wrist flexion wrist extension strength bilaterally.  Radial pulse intact bilaterally.  No wasting abductor pollicis brevis.  Abductor pollicis brevis strength is 5 out of 5 bilaterally.  Positive carpal tunnel compression testing on the right.  Negative Tinel's and no subluxation of the ulnar nerve at the right elbow.  Cervical spine range of motion intact.  Specialty Comments:  No specialty comments available.  Imaging: No results found.   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   Dyspareunia 11/12/2014   Dysuria 09/17/2011   Past Medical History:  Diagnosis Date   Anemia    Blood transfusion without  reported diagnosis    Depression    Ectopic pregnancy    Hyperlipidemia    Hypertension    Preterm labor     Family History  Problem Relation Age of Onset   Diabetes Mother    Hypertension Mother    Diabetes Father    Hypertension Father    Diabetes Paternal Aunt    Breast cancer Paternal Aunt    Diabetes Paternal Grandmother    Anesthesia problems Neg Hx    Hearing loss Neg Hx    Other Neg Hx    Stomach cancer Neg Hx    Rectal cancer Neg Hx     Esophageal cancer Neg Hx    Colon cancer Neg Hx     Past Surgical History:  Procedure Laterality Date   ABDOMINAL HYSTERECTOMY     BREAST BIOPSY Right 2013   FA   CESAREAN SECTION     C/S x 4   UPPER GASTROINTESTINAL ENDOSCOPY  04/23/2020   Social History   Occupational History   Occupation: bartender  Tobacco Use   Smoking status: Former    Current packs/day: 0.00    Types: Cigarettes    Quit date: 2005    Years since quitting: 21.0   Smokeless tobacco: Never  Vaping Use   Vaping status: Never Used  Substance and Sexual Activity   Alcohol use: Not Currently    Comment: every 8 days 5-8 drinks of wine and whiskey (occ)   Drug use: No   Sexual activity: Yes    Birth control/protection: None, Surgical

## 2024-04-05 ENCOUNTER — Other Ambulatory Visit: Payer: Self-pay

## 2024-04-05 ENCOUNTER — Encounter (HOSPITAL_COMMUNITY): Payer: Self-pay | Admitting: Orthopedic Surgery

## 2024-04-05 NOTE — Progress Notes (Signed)
 Anesthesia Chart Review: SAME DAY WORK-UP  Case: [id number] Date/Time: 04/06/24 1021   Procedure: CARPAL TUNNEL RELEASE (Right)   Anesthesia type: Regional   Diagnosis: Carpal tunnel syndrome of right wrist [G56.01]   Pre-op diagnosis: carpal tunnel right wrist   [facility] / [facility]   Surgeons: Addie Cordella Hamilton, MD       DISCUSSION: Patient is a 44 year old female scheduled for the above procedure.   History includes former smoker (quit 2005), HTN, HLD, anemia, hysterectomy. Pre-diabetic by A1c of 6.1% (04/30/2023).   She is followed by cardiologist Dr. Sheena for palpitations, HLD, and HTN. She had an unremarkable echo and Zio monitor in Spring of 2022 (see below). Propanolol offered to see if it would help with palpitations, but she declined. She had been on statin with improvement in her LDL, but she had stopped it with LDL back up to ~ 150s. She reported some elevated BP readings but preferred not be to be started on antihypertensives yet, so she would continue home monitoring. Six month follow-up planned at her 04/06/2023 visit. Last 3 BP readings are reasonably controlled with range of 119-132-/81-84.  By current medication list, she is not currently on any routine medications.   She is a same day work-up. Updated labs and EKG on arrival as indicated. Anesthesia team evaluation on the day of surgery.    VS: LMP 10/08/2011  Wt Readings from Last 3 Encounters:  10/12/23 80.1 kg  08/19/23 78.6 kg  04/22/23 78.1 kg   BP Readings from Last 3 Encounters:  10/12/23 119/81  08/31/23 132/84  08/25/23 125/84   Pulse Readings from Last 3 Encounters:  10/12/23 65  08/31/23 77  08/25/23 70     PROVIDERS: Vicci Barnie NOVAK, MD is PCP  Sheena Pugh, DO is cardiologist   LABS: For day of surgery as indicated. Most recent results in [facility] include: Lab Results  Component Value Date   WBC 7.0 04/22/2023   HGB 12.9 04/22/2023   HCT 40.7 04/22/2023   PLT 264 04/22/2023    GLUCOSE 110 (H) 04/22/2023   CHOL 210 (H) 04/30/2023   TRIG 94 04/30/2023   HDL 40 04/30/2023   LDLCALC 153 (H) 04/30/2023   ALT 7 04/22/2023   AST 17 04/22/2023   NA 138 04/22/2023   K 4.7 04/22/2023   CL 100 04/22/2023   CREATININE 0.80 04/22/2023   BUN 24 04/22/2023   CO2 23 04/22/2023   TSH 0.814 10/12/2023   HGBA1C 6.1 04/30/2023    EKG: Updated EKG on the day of surgery as indicated. Last EKG on 04/06/2023 showed NSR.    CV: Echo 08/08/2021: IMPRESSIONS   1. Left ventricular ejection fraction, by estimation, is 60 to 65%. The  left ventricle has normal function. The left ventricle has no regional  wall motion abnormalities. Left ventricular diastolic parameters were  normal.   2. Right ventricular systolic function is normal. The right ventricular  size is normal.   3. The mitral valve is normal in structure. Trivial mitral valve  regurgitation. No evidence of mitral stenosis.   4. The aortic valve is tricuspid. Aortic valve regurgitation is not  visualized. No aortic stenosis is present.   5. The inferior vena cava is normal in size with greater than 50%  respiratory variability, suggesting right atrial pressure of 3 mmHg.   Long term ZioXT monitor 07/17/2021 - 07/31/2021: Patient had a min HR of 49 bpm, max HR of 134 bpm, and  avg HR of 76 bpm. Predominant underlying rhythm was Sinus Rhythm. Isolated SVEs were rare (<1.0%), SVE Couplets were rare (<1.0%), and no SVE Triplets were present. Isolated VEs were rare (<1.0%),  and no VE Couplets or VE Triplets were present.  Conclusion: Normal study  Past Medical History:  Diagnosis Date   Anemia    Blood transfusion without reported diagnosis    Depression    Ectopic pregnancy    Hyperlipidemia    Hypertension    Preterm labor     Past Surgical History:  Procedure Laterality Date   ABDOMINAL HYSTERECTOMY     BREAST BIOPSY Right 2013   FA   CESAREAN SECTION     C/S x 4   UPPER GASTROINTESTINAL ENDOSCOPY   04/23/2020    MEDICATIONS: No current facility-administered medications for this encounter.   No current outpatient medications on file.   Isaiah Ruder, PA-C Surgical Short Stay/Anesthesiology [facility] Phone [phone] [facility] Phone [phone] 04/05/2024 9:41 AM

## 2024-04-05 NOTE — Progress Notes (Signed)
 SDW call  Patient was given pre-op instructions over the phone via [facility], [place], #[id number]. Patient verbalized understanding of instructions provided. She denied any SOB, fever or cough     PCP - Dr. Barnie Louder Cardiologist - Dr. Dub Huntsman, states she only saw once for SOB, denies being on any medication for her blood pressure Pulmonary:    PPM/ICD - denies Device Orders - na Rep Notified - na   Chest x-ray - 05/20/2022 EKG -  DOS, 04/06/2024 Stress Test - ECHO - 11/08/2021 Cardiac Cath -   Sleep Study/sleep apnea/CPAP: denies  Non-diabetic  Blood Thinner Instructions: denies Aspirin Instructions:denies   ERAS Protcol - Clears until 0730   Anesthesia review: Yes. Previous cardiology visit  Your procedure is scheduled on Thursday April 06, 2024  Report to [facility] Main Entrance A at  0800  A.M., then check in with the Admitting office.  Call this number if you have problems the morning of surgery:  [phone]   If you have any questions prior to your surgery date call [phone]: Open Monday-Friday 8am-4pm If you experience any cold or flu symptoms such as cough, fever, chills, shortness of breath, etc. between now and your scheduled surgery, please notify us  at the above number     Remember:  Do not eat after midnight the night before your surgery  You may drink clear liquids until  0730   the morning of your surgery.   Clear liquids allowed are: Water, Non-Citrus Juices (without pulp), Carbonated Beverages, Clear Tea, Black Coffee ONLY (NO MILK, CREAM OR POWDERED CREAMER of any kind), and Gatorade   Take these medicines the morning of surgery with A SIP OF WATER:  None  As of today, STOP taking any Aspirin (unless otherwise instructed by your surgeon) Aleve , Naproxen , Ibuprofen , Motrin , Advil , Goody's, BC's, all herbal medications, fish oil, and all vitamins.

## 2024-04-05 NOTE — Anesthesia Preprocedure Evaluation (Addendum)
"                                    Anesthesia Evaluation  Patient identified by MRN, date of birth, ID band Patient awake    Reviewed: Allergy & Precautions, NPO status , Patient's Chart, lab work & pertinent test results  History of Anesthesia Complications (+) PONV and history of anesthetic complications  Airway Mallampati: II  TM Distance: >3 FB Neck ROM: Full    Dental  (+) Dental Advisory Given, Teeth Intact   Pulmonary neg recent URI, former smoker   Pulmonary exam normal breath sounds clear to auscultation       Cardiovascular hypertension, (-) DOE Normal cardiovascular exam Rhythm:Regular Rate:Normal  Echo 08/08/2021:  1. Left ventricular ejection fraction, by estimation, is 60 to 65%. The  left ventricle has normal function. The left ventricle has no regional  wall motion abnormalities. Left ventricular diastolic parameters were  normal.   2. Right ventricular systolic function is normal. The right ventricular  size is normal.   3. The mitral valve is normal in structure. Trivial mitral valve  regurgitation. No evidence of mitral stenosis.   4. The aortic valve is tricuspid. Aortic valve regurgitation is not  visualized. No aortic stenosis is present.   5. The inferior vena cava is normal in size with greater than 50%  respiratory variability, suggesting right atrial pressure of 3 mmHg.     Neuro/Psych  Headaches PSYCHIATRIC DISORDERS  Depression     Neuromuscular disease    GI/Hepatic Neg liver ROS,GERD  ,,  Endo/Other  negative endocrine ROS    Renal/GU negative Renal ROS     Musculoskeletal negative musculoskeletal ROS (+)    Abdominal  (+) + obese  Peds  Hematology  (+) Blood dyscrasia, anemia   Anesthesia Other Findings   Reproductive/Obstetrics                              Anesthesia Physical Anesthesia Plan  ASA: 2  Anesthesia Plan: General   Post-op Pain Management: Tylenol  PO (pre-op)* and  Toradol  IV (intra-op)*   Induction: Intravenous  PONV Risk Score and Plan: 4 or greater and Ondansetron , Dexamethasone , Treatment may vary due to age or medical condition, Propofol  infusion, Midazolam  and TIVA  Airway Management Planned: LMA  Additional Equipment:   Intra-op Plan:   Post-operative Plan: Extubation in OR  Informed Consent: I have reviewed the patients History and Physical, chart, labs and discussed the procedure including the risks, benefits and alternatives for the proposed anesthesia with the patient or authorized representative who has indicated his/her understanding and acceptance.     Dental advisory given  Plan Discussed with: CRNA  Anesthesia Plan Comments: (PAT note written 04/05/2024 by Allison Zelenak, PA-C. Spanish speaking.  )         Anesthesia Quick Evaluation  "

## 2024-04-06 ENCOUNTER — Telehealth: Payer: Self-pay | Admitting: Orthopedic Surgery

## 2024-04-06 ENCOUNTER — Ambulatory Visit (HOSPITAL_COMMUNITY): Admitting: Vascular Surgery

## 2024-04-06 ENCOUNTER — Other Ambulatory Visit: Payer: Self-pay

## 2024-04-06 ENCOUNTER — Encounter (HOSPITAL_COMMUNITY): Admission: RE | Disposition: A | Payer: Self-pay | Source: Home / Self Care | Attending: Orthopedic Surgery

## 2024-04-06 ENCOUNTER — Encounter (HOSPITAL_COMMUNITY): Payer: Self-pay | Admitting: Orthopedic Surgery

## 2024-04-06 ENCOUNTER — Ambulatory Visit (HOSPITAL_COMMUNITY)
Admission: RE | Admit: 2024-04-06 | Discharge: 2024-04-06 | Disposition: A | Discharge status: Home / Self Care | Attending: Orthopedic Surgery | Admitting: Orthopedic Surgery

## 2024-04-06 ENCOUNTER — Other Ambulatory Visit: Payer: Self-pay | Admitting: Surgical

## 2024-04-06 DIAGNOSIS — G5601 Carpal tunnel syndrome, right upper limb: Secondary | ICD-10-CM | POA: Insufficient documentation

## 2024-04-06 DIAGNOSIS — K219 Gastro-esophageal reflux disease without esophagitis: Secondary | ICD-10-CM | POA: Insufficient documentation

## 2024-04-06 DIAGNOSIS — F32A Depression, unspecified: Secondary | ICD-10-CM | POA: Insufficient documentation

## 2024-04-06 DIAGNOSIS — R7303 Prediabetes: Secondary | ICD-10-CM | POA: Insufficient documentation

## 2024-04-06 DIAGNOSIS — Z87891 Personal history of nicotine dependence: Secondary | ICD-10-CM | POA: Insufficient documentation

## 2024-04-06 DIAGNOSIS — D649 Anemia, unspecified: Secondary | ICD-10-CM | POA: Insufficient documentation

## 2024-04-06 DIAGNOSIS — E785 Hyperlipidemia, unspecified: Secondary | ICD-10-CM | POA: Insufficient documentation

## 2024-04-06 DIAGNOSIS — I10 Essential (primary) hypertension: Secondary | ICD-10-CM | POA: Insufficient documentation

## 2024-04-06 HISTORY — PX: CARPAL TUNNEL RELEASE: SHX101

## 2024-04-06 HISTORY — DX: Nausea with vomiting, unspecified: R11.2

## 2024-04-06 HISTORY — DX: Other complications of anesthesia, initial encounter: T88.59XA

## 2024-04-06 LAB — CBC
HCT: 38.5 % (ref 36.0–46.0)
Hemoglobin: 12.6 g/dL (ref 12.0–15.0)
MCH: 27.2 pg (ref 26.0–34.0)
MCHC: 32.7 g/dL (ref 30.0–36.0)
MCV: 83 fL (ref 80.0–100.0)
Platelets: 244 K/uL (ref 150–400)
RBC: 4.64 MIL/uL (ref 3.87–5.11)
RDW: 13.2 % (ref 11.5–15.5)
WBC: 6.4 K/uL (ref 4.0–10.5)
nRBC: 0 % (ref 0.0–0.2)

## 2024-04-06 LAB — BASIC METABOLIC PANEL WITH GFR
Anion gap: 11 (ref 5–15)
BUN: 22 mg/dL — ABNORMAL HIGH (ref 6–20)
CO2: 24 mmol/L (ref 22–32)
Calcium: 9.2 mg/dL (ref 8.9–10.3)
Chloride: 102 mmol/L (ref 98–111)
Creatinine, Ser: 0.69 mg/dL (ref 0.44–1.00)
GFR, Estimated: >60 mL/min
Glucose, Bld: 120 mg/dL — ABNORMAL HIGH (ref 70–99)
Potassium: 4.1 mmol/L (ref 3.5–5.1)
Sodium: 137 mmol/L (ref 135–145)

## 2024-04-06 MED ORDER — DEXAMETHASONE SOD PHOSPHATE PF 10 MG/ML IJ SOLN
INTRAMUSCULAR | Status: DC | PRN
  Administered 2024-04-06: 4 mg via INTRAVENOUS

## 2024-04-06 MED ORDER — POVIDONE-IODINE 10 % EX SWAB: 2.0000 | Freq: Once | CUTANEOUS | Status: DC

## 2024-04-06 MED ORDER — HYDROMORPHONE HCL 1 MG/ML IJ SOLN
INTRAMUSCULAR | Status: AC
  Filled 2024-04-06: qty 0.5

## 2024-04-06 MED ORDER — TRAMADOL HCL 50 MG PO TABS: 50.0000 mg | ORAL_TABLET | Freq: Four times a day (QID) | ORAL | 0 refills | Status: DC | PRN

## 2024-04-06 MED ORDER — KETOROLAC TROMETHAMINE 30 MG/ML IJ SOLN
INTRAMUSCULAR | Status: DC | PRN
  Administered 2024-04-06: 30 mg via INTRAVENOUS

## 2024-04-06 MED ORDER — ACETAMINOPHEN 500 MG PO TABS
1000.0000 mg | ORAL_TABLET | Freq: Once | ORAL | Status: AC
  Administered 2024-04-06: 1000 mg via ORAL
  Filled 2024-04-06: qty 2

## 2024-04-06 MED ORDER — ONDANSETRON HCL 4 MG/2ML IJ SOLN
INTRAMUSCULAR | Status: DC | PRN
  Administered 2024-04-06: 4 mg via INTRAVENOUS

## 2024-04-06 MED ORDER — POVIDONE-IODINE 7.5 % EX SOLN
Freq: Once | CUTANEOUS | Status: DC
  Filled 2024-04-06: qty 118

## 2024-04-06 MED ORDER — ORAL CARE MOUTH RINSE: 15.0000 mL | Freq: Once | OROMUCOSAL | Status: AC

## 2024-04-06 MED ORDER — FENTANYL CITRATE (PF) 250 MCG/5ML IJ SOLN
INTRAMUSCULAR | Status: DC | PRN
  Administered 2024-04-06 (×2): 50 ug via INTRAVENOUS

## 2024-04-06 MED ORDER — PROPOFOL 10 MG/ML IV BOLUS
INTRAVENOUS | Status: AC
  Filled 2024-04-06: qty 20

## 2024-04-06 MED ORDER — HYDROMORPHONE HCL 1 MG/ML IJ SOLN
INTRAMUSCULAR | Status: DC | PRN
  Administered 2024-04-06: .5 mg via INTRAVENOUS

## 2024-04-06 MED ORDER — MIDAZOLAM HCL (PF) 2 MG/2ML IJ SOLN
INTRAMUSCULAR | Status: DC | PRN
  Administered 2024-04-06: 2 mg via INTRAVENOUS

## 2024-04-06 MED ORDER — GABAPENTIN 100 MG PO CAPS: 100.0000 mg | ORAL_CAPSULE | Freq: Three times a day (TID) | ORAL | 0 refills | Status: DC

## 2024-04-06 MED ORDER — FENTANYL CITRATE (PF) 100 MCG/2ML IJ SOLN: 25.0000 ug | INTRAMUSCULAR | Status: DC | PRN

## 2024-04-06 MED ORDER — BUPIVACAINE HCL (PF) 0.25 % IJ SOLN
INTRAMUSCULAR | Status: DC | PRN
  Administered 2024-04-06: 10 mL

## 2024-04-06 MED ORDER — PROPOFOL 500 MG/50ML IV EMUL
INTRAVENOUS | Status: DC | PRN
  Administered 2024-04-06: 40 mg via INTRAVENOUS
  Administered 2024-04-06: 50 mg via INTRAVENOUS
  Administered 2024-04-06: 150 ug/kg/min via INTRAVENOUS

## 2024-04-06 MED ORDER — LIDOCAINE 2% (20 MG/ML) 5 ML SYRINGE
INTRAMUSCULAR | Status: DC | PRN
  Administered 2024-04-06: 80 mg via INTRAVENOUS

## 2024-04-06 MED ORDER — PROPOFOL 10 MG/ML IV BOLUS
INTRAVENOUS | Status: DC | PRN
  Administered 2024-04-06: 170 mg via INTRAVENOUS

## 2024-04-06 MED ORDER — LACTATED RINGERS IV SOLN: INTRAVENOUS | Status: DC

## 2024-04-06 MED ORDER — BUPIVACAINE HCL (PF) 0.25 % IJ SOLN
INTRAMUSCULAR | Status: AC
  Filled 2024-04-06: qty 30

## 2024-04-06 MED ORDER — CEFAZOLIN SODIUM-DEXTROSE 2-4 GM/100ML-% IV SOLN
2.0000 g | INTRAVENOUS | Status: AC
  Administered 2024-04-06: 2 g via INTRAVENOUS
  Filled 2024-04-06: qty 100

## 2024-04-06 MED ORDER — CHLORHEXIDINE GLUCONATE 0.12 % MT SOLN
15.0000 mL | Freq: Once | OROMUCOSAL | Status: AC
  Administered 2024-04-06: 15 mL via OROMUCOSAL
  Filled 2024-04-06: qty 15

## 2024-04-06 MED ORDER — DROPERIDOL 2.5 MG/ML IJ SOLN: 0.6250 mg | Freq: Once | INTRAMUSCULAR | Status: DC | PRN

## 2024-04-06 NOTE — Anesthesia Procedure Notes (Signed)
 Procedure Name: LMA Insertion Date/Time: 04/06/2024 11:02 AM  Performed by: Lockie Flesher, CRNAPre-anesthesia Checklist: Patient identified, Emergency Drugs available, Suction available and Patient being monitored Patient Re-evaluated:Patient Re-evaluated prior to induction Oxygen Delivery Method: Circle System Utilized Preoxygenation: Pre-oxygenation with 100% oxygen Induction Type: IV induction Ventilation: Mask ventilation without difficulty LMA: LMA inserted LMA Size: 4.0 Number of attempts: 1 Airway Equipment and Method: Bite block Placement Confirmation: positive ETCO2 Tube secured with: Tape Dental Injury: Teeth and Oropharynx as per pre-operative assessment

## 2024-04-06 NOTE — Op Note (Signed)
 NAME: Brooke Lam, Brooke Lam MEDICAL RECORD NO: [id number] ACCOUNT NO: [id number] DATE OF BIRTH: [birth year] FACILITY: [facility] LOCATION: [facility] PHYSICIAN: Cordella RAMAN. Addie, MD  Operative Report   DATE OF PROCEDURE: 04/06/2024  PREOPERATIVE DIAGNOSIS:  Right carpal tunnel syndrome.  POSTOPERATIVE DIAGNOSIS:  Right carpal tunnel syndrome.  PROCEDURE:  Right carpal tunnel release.  SURGEON:  Cordella RAMAN. Addie, MD  ASSISTANT:  Herlene Calix, PA  INDICATIONS:  The patient is a 44 year old patient with severe carpal tunnel syndrome, presents for operative management after explanation of risks and benefits.  DESCRIPTION OF PROCEDURE:  Patient brought to the operating room where general anesthetic was induced, preoperative antibiotics administered, timeout was called.  Right hand was prescrubbed with alcohol and Betadine , allowed to air dry, prepped with  DuraPrep solution and draped in a sterile manner.  After calling timeout, the arm was elevated and exsanguinated with an Esmarch, tourniquet was inflated.  Tourniquet time 13 minutes.  An incision was made at the intersection of Kaplan's cardinal line  with the radial border of the fourth finger taking down to the proximal wrist flexion crease.  Skin and subcutaneous tissue were sharply divided.  Bleeding points encountered were cauterized using bipolar electrocautery.  Palmar fascia was identified and  incised.  Transverse carpal ligament was identified and then incised 2-3 mm longitudinally.  A right angle retractor was then used to lift the transverse carpal ligament off the median nerve and which was divided distally to the end of the ligament and  then proximally with the use of sewell retractor under direct visualization to the forearm fascia protecting the median nerve with a freer elevator.  Motor branch intact.  No masses within the carpal canal.  Thorough irrigation then performed.  Skin edges then anesthetized using Marcaine  plain.  The  tourniquet was released and bleeding points encountered were controlled using bipolar electrocautery.  Nylon sutures were used to close the skin and impervious dressing and Ace wrap placed.  Patient tolerated  procedure well without immediate complications.  Luke's assistance was required at all times for retraction, opening, closing, mobilization of tissue.  His assistance was a medical necessity.   VAI D: 04/06/2024 12:03:56 pm T: 04/06/2024 9:50:00 pm  JOB: [id number]/ [id number]

## 2024-04-06 NOTE — H&P (Signed)
 Brooke Lam is an 44 y.o. female.   Chief Complaint: Right hand numbness and tingling HPI: Brooke Lam is a 44 y.o. female who presents  reporting right hand pain numbness and tingling.  Has known history of severe bilateral carpal tunnel syndrome and has not been seen in 2 years.  Patient is right-hand dominant.  States that nighttime symptoms are worse.  Does wake for 5-6 times a night.  She has to shake her hand to work out her symptoms.  Brace only helps sporadically.  Symptoms are worse now than when she was seen in 2024.  She does housekeeping..    Past Medical History:  Diagnosis Date   Anemia    Blood transfusion without reported diagnosis    Complication of anesthesia    Depression    Ectopic pregnancy    Hyperlipidemia    Hypertension    PONV (postoperative nausea and vomiting)    Preterm labor     Past Surgical History:  Procedure Laterality Date   ABDOMINAL HYSTERECTOMY     BREAST BIOPSY Right 2013   FA   CESAREAN SECTION     C/S x 4   UPPER GASTROINTESTINAL ENDOSCOPY  04/23/2020    Family History  Problem Relation Age of Onset   Diabetes Mother    Hypertension Mother    Diabetes Father    Hypertension Father    Diabetes Paternal Aunt    Breast cancer Paternal Aunt    Diabetes Paternal Grandmother    Anesthesia problems Neg Hx    Hearing loss Neg Hx    Other Neg Hx    Stomach cancer Neg Hx    Rectal cancer Neg Hx    Esophageal cancer Neg Hx    Colon cancer Neg Hx    Social History:  reports that she quit smoking about 21 years ago. Her smoking use included cigarettes. She has never used smokeless tobacco. She reports current alcohol use. She reports that she does not use drugs.  Allergies: Allergies[1]  No medications prior to admission.    Results for orders placed or performed during the hospital encounter of 04/06/24 (from the past 48 hours)  Basic metabolic panel per protocol     Status: Abnormal   Collection Time: 04/06/24   9:05 AM  Result Value Ref Range   Sodium 137 135 - 145 mmol/L   Potassium 4.1 3.5 - 5.1 mmol/L   Chloride 102 98 - 111 mmol/L   CO2 24 22 - 32 mmol/L   Glucose, Bld 120 (H) 70 - 99 mg/dL    Comment: Glucose reference range applies only to samples taken after fasting for at least 8 hours.   BUN 22 (H) 6 - 20 mg/dL   Creatinine, Ser 9.30 0.44 - 1.00 mg/dL   Calcium  9.2 8.9 - 10.3 mg/dL   GFR, Estimated >39 >39 mL/min    Comment: (NOTE) Calculated using the CKD-EPI Creatinine Equation (2021)    Anion gap 11 5 - 15    Comment: Performed at [facility], [address] [place], [place] [address]  CBC per protocol     Status: None   Collection Time: 04/06/24  9:05 AM  Result Value Ref Range   WBC 6.4 4.0 - 10.5 K/uL   RBC 4.64 3.87 - 5.11 MIL/uL   Hemoglobin 12.6 12.0 - 15.0 g/dL   HCT 61.4 63.9 - 53.9 %   MCV 83.0 80.0 - 100.0 fL   MCH 27.2 26.0 - 34.0  pg   MCHC 32.7 30.0 - 36.0 g/dL   RDW 86.7 88.4 - 84.4 %   Platelets 244 150 - 400 K/uL   nRBC 0.0 0.0 - 0.2 %    Comment: Performed at [facility], [address] [place], [place] [address]   No results found.  Review of Systems  Musculoskeletal:  Positive for arthralgias.  All other systems reviewed and are negative.   Blood pressure 130/86, pulse 88, temperature 98 F (36.7 C), temperature source Oral, resp. rate 19, height 5' 3 (1.6 m), weight 81.6 kg, last menstrual period 10/08/2011, SpO2 97%. Physical Exam Vitals reviewed.  HENT:     Head: Normocephalic.     Nose: Nose normal.     Mouth/Throat:     Mouth: Mucous membranes are moist.  Eyes:     Pupils: Pupils are equal, round, and reactive to light.  Cardiovascular:     Rate and Rhythm: Normal rate.     Pulses: Normal pulses.  Pulmonary:     Effort: Pulmonary effort is normal.  Abdominal:     General: Abdomen is flat.  Musculoskeletal:     Cervical back: Normal range of motion.  Skin:    General: Skin is warm.     Capillary Refill: Capillary  refill takes less than 2 seconds.  Neurological:     General: No focal deficit present.     Mental Status: She is alert.  Psychiatric:        Mood and Affect: Mood normal.      Orthopedic exam demonstrates intact grip EPL FPL interosseous wrist flexion wrist extension strength bilaterally.  Radial pulse intact bilaterally.  No wasting abductor pollicis brevis.  Abductor pollicis brevis strength is 5 out of 5 bilaterally.  Positive carpal tunnel compression testing on the right.  Negative Tinel's and no subluxation of the ulnar nerve at the right elbow.  Cervical spine range of motion intact.  Assessment/Plan Impression is symptomatic carpal tunnel syndrome worse on the right-hand side. Failure of conservative management. Plan is right carpal tunnel release. Risks and benefits were discussed with the patient including not limited to infection nerve or vessel damage incomplete functional restoration and incomplete pain weak. Anticipated weeks 4 weeks before she would feel good about doing any type of physical activity with the hand. Plan to take sutures out at 10 days postop. All questions answered.   Brooke Glendia Hutchinson, MD 04/06/2024, 10:15 AM       [1] No Known Allergies

## 2024-04-06 NOTE — Transfer of Care (Signed)
 Immediate Anesthesia Transfer of Care Note  Patient: Brooke Lam  Procedure(s) Performed: CARPAL TUNNEL RELEASE (Right)  Patient Location: PACU  Anesthesia Type:General  Level of Consciousness: awake, alert , and oriented  Airway & Oxygen Therapy: Patient Spontanous Breathing and Patient connected to face mask oxygen  Post-op Assessment: Report given to RN, Post -op Vital signs reviewed and stable, and Patient moving all extremities  Post vital signs: Reviewed and stable  Last Vitals:  Vitals Value Taken Time  BP 117/79 04/06/24 11:51  Temp 36.4 C 04/06/24 11:50  Pulse 78 04/06/24 11:58  Resp 12 04/06/24 11:58  SpO2 91 % 04/06/24 11:58  Vitals shown include unfiled device data.  Last Pain:  Vitals:   04/06/24 0853  TempSrc:   PainSc: 0-No pain      Patients Stated Pain Goal: 0 (04/06/24 0853)  Complications: There were no known notable events for this encounter.

## 2024-04-06 NOTE — Telephone Encounter (Signed)
 Patient called. Says she is at the pharmacy. There isn't a prescription for pain medication. Would like some pain medication .

## 2024-04-06 NOTE — Telephone Encounter (Signed)
 Sent in

## 2024-04-06 NOTE — Brief Op Note (Signed)
" ° °  04/06/2024  11:59 AM  PATIENT:  Brooke Lam  44 y.o. female  PRE-OPERATIVE DIAGNOSIS:  carpal tunnel right wrist  POST-OPERATIVE DIAGNOSIS:  carpal tunnel right wrist  PROCEDURE:  Procedures: CARPAL TUNNEL RELEASE  SURGEON:  Surgeon(s): Addie Cordella Hamilton, MD  ASSISTANT: Magnant PA  ANESTHESIA:   general  EBL: 2 ml    Total I/O In: 779.7 [I.V.:679.7; IV Piggyback:100] Out: 5 [Blood:5]  BLOOD ADMINISTERED: none  DRAINS: none   LOCAL MEDICATIONS USED:  none  SPECIMEN:  No Specimen  COUNTS:  YES  TOURNIQUET:   Total Tourniquet Time Documented: Upper Arm (Right) - 13 minutes Total: Upper Arm (Right) - 13 minutes   DICTATION: .[id number] PLAN OF CARE: Discharge to home after PACU  PATIENT DISPOSITION:  PACU - hemodynamically stable              "

## 2024-04-07 ENCOUNTER — Encounter (HOSPITAL_COMMUNITY): Payer: Self-pay | Admitting: Orthopedic Surgery

## 2024-04-07 NOTE — Anesthesia Postprocedure Evaluation (Signed)
"   Anesthesia Post Note  Patient: Brooke Lam  Procedure(s) Performed: CARPAL TUNNEL RELEASE (Right)     Patient location during evaluation: PACU Anesthesia Type: General Level of consciousness: sedated and patient cooperative Pain management: pain level controlled Vital Signs Assessment: post-procedure vital signs reviewed and stable Respiratory status: spontaneous breathing Cardiovascular status: stable Anesthetic complications: no   There were no known notable events for this encounter.  Last Vitals:  Vitals:   04/06/24 1215 04/06/24 1230  BP: 107/60 114/78  Pulse: 69 63  Resp: 17 12  Temp:  36.4 C  SpO2: 93% 95%    Last Pain:  Vitals:   04/06/24 1230  TempSrc:   PainSc: 0-No pain                 Norleen Pope      "
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
## 2024-04-11 ENCOUNTER — Ambulatory Visit: Payer: Self-pay | Admitting: Neurology

## 2024-04-17 ENCOUNTER — Encounter: Admitting: Surgical
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
## 2024-04-19 ENCOUNTER — Other Ambulatory Visit: Payer: Self-pay

## 2024-04-19 ENCOUNTER — Ambulatory Visit: Payer: Self-pay | Admitting: Neurology

## 2024-04-19 ENCOUNTER — Encounter: Payer: Self-pay | Admitting: Neurology

## 2024-04-19 ENCOUNTER — Ambulatory Visit (INDEPENDENT_AMBULATORY_CARE_PROVIDER_SITE_OTHER): Payer: Self-pay | Admitting: Orthopedic Surgery

## 2024-04-19 VITALS — BP 134/84 | HR 104 | Ht 63.0 in | Wt 186.3 lb

## 2024-04-19 DIAGNOSIS — M542 Cervicalgia: Secondary | ICD-10-CM

## 2024-04-19 DIAGNOSIS — M5481 Occipital neuralgia: Secondary | ICD-10-CM

## 2024-04-19 DIAGNOSIS — G5601 Carpal tunnel syndrome, right upper limb: Secondary | ICD-10-CM

## 2024-04-19 MED ORDER — TIZANIDINE HCL 4 MG PO TABS
4.0000 mg | ORAL_TABLET | Freq: Three times a day (TID) | ORAL | 5 refills | Status: AC | PRN
  Filled 2024-04-19: qty 90, 30d supply, fill #0

## 2024-04-19 NOTE — Progress Notes (Signed)
 "  Post-Op Visit Note   Patient: Brooke Lam           Date of Birth: 1980-07-13           MRN: [id number] Visit Date: 04/19/2024 PCP: Vicci Barnie NOVAK, MD   Assessment & Plan:  Chief Complaint:  Chief Complaint  Patient presents with   Post-op Follow-up    Right CTR 04/06/2024   Visit Diagnoses:  1. Carpal tunnel syndrome, right upper limb     Plan: Patient is now 10 days out right carpal tunnel release.  Surgery done 04/06/2024.  On exam the sutures are intact.  Sutures are removed.  Abductor pollicis brevis is functional.  Numbing is gone.  A little bit of pain around the incision.  Plan at this time is to work on wrist range of motion and finger range of motion.  No lifting more than 5 pounds.  4-week return for final clinical recheck and release.  Not taking any pain meds.  Follow-Up Instructions: No follow-ups on file.   Orders:  No orders of the defined types were placed in this encounter.  No orders of the defined types were placed in this encounter.   Imaging: No results found.  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   Dyspareunia 11/12/2014   Dysuria 09/17/2011   Past Medical History:  Diagnosis Date   Anemia    Blood transfusion without reported diagnosis    Complication of anesthesia    Depression    Ectopic pregnancy    Hyperlipidemia    Hypertension    PONV (postoperative nausea and vomiting)    Preterm labor     Family History  Problem Relation Age of Onset   Diabetes Mother    Hypertension Mother    Diabetes Father     Hypertension Father    Diabetes Paternal Aunt    Breast cancer Paternal Aunt    Diabetes Paternal Grandmother    Anesthesia problems Neg Hx    Hearing loss Neg Hx    Other Neg Hx    Stomach cancer Neg Hx    Rectal cancer Neg Hx    Esophageal cancer Neg Hx    Colon cancer Neg Hx     Past Surgical History:  Procedure Laterality Date   ABDOMINAL HYSTERECTOMY     BREAST BIOPSY Right 2013   FA   CARPAL TUNNEL RELEASE Right 04/06/2024   Procedure: CARPAL TUNNEL RELEASE;  Surgeon: Addie Cordella Hamilton, MD;  Location: [facility] OR;  Service: Orthopedics;  Laterality: Right;   CESAREAN SECTION     C/S x 4   UPPER GASTROINTESTINAL ENDOSCOPY  04/23/2020   Social History   Occupational History   Occupation: bartender  Tobacco Use   Smoking status: Former    Current packs/day: 0.00    Types: Cigarettes    Quit date: 2005    Years since quitting: 21.0   Smokeless tobacco: Never  Vaping Use   Vaping status: Never Used  Substance and Sexual Activity   Alcohol use: Yes    Comment: every 8 days 5-8  drinks of wine and whiskey (occ)   Drug use: No   Sexual activity: Yes    Birth control/protection: None, Surgical     "

## 2024-04-19 NOTE — Patient Instructions (Addendum)
 Check MRI of cervical spine without contrast For acute muscle and neck pain, take tizanidine  4mg  up to 3 times daily as needed.  Caution for drowsiness Pending results of MRI, will send you to the appropriate physician for treatment. Follow up with me in 4 to 5 months.    Check MRI of cervical spine without contrast 2. For acute muscle and neck pain, take tizanidine  4mg  up to 3 times daily as needed.  Caution for drowsiness 3. Pending results of MRI, will send you to the appropriate physician for treatment. 4. Follow up with me in 4 to 5 months.

## 2024-04-21 ENCOUNTER — Encounter: Payer: Self-pay | Admitting: Orthopedic Surgery

## 2024-04-27 ENCOUNTER — Other Ambulatory Visit: Payer: Self-pay

## 2024-05-11 ENCOUNTER — Ambulatory Visit: Payer: Self-pay | Admitting: Internal Medicine

## 2024-05-17 ENCOUNTER — Encounter: Admitting: Orthopedic Surgery

## 2024-10-24 ENCOUNTER — Ambulatory Visit: Payer: Self-pay | Admitting: Neurology
